# Patient Record
Sex: Female | Born: 1950 | Race: White | Hispanic: No
Health system: Midwestern US, Academic
[De-identification: ages and names within clinical notes are randomized; demographics above are authoritative.]

---

## 2016-08-09 ENCOUNTER — Encounter: Admit: 2016-08-09 | Discharge: 2016-08-09 | Payer: PRIVATE HEALTH INSURANCE

## 2016-08-09 ENCOUNTER — Encounter: Admit: 2016-08-09 | Discharge: 2016-08-09 | Payer: MEDICARE

## 2016-08-09 DIAGNOSIS — Z9889 Other specified postprocedural states: ICD-10-CM

## 2016-08-09 DIAGNOSIS — R1032 Left lower quadrant pain: ICD-10-CM

## 2016-08-09 DIAGNOSIS — Z8041 Family history of malignant neoplasm of ovary: ICD-10-CM

## 2016-08-09 DIAGNOSIS — R718 Other abnormality of red blood cells: ICD-10-CM

## 2016-08-09 DIAGNOSIS — R971 Elevated cancer antigen 125 [CA 125]: ICD-10-CM

## 2016-08-09 DIAGNOSIS — M810 Age-related osteoporosis without current pathological fracture: ICD-10-CM

## 2016-08-09 DIAGNOSIS — Z1231 Encounter for screening mammogram for malignant neoplasm of breast: ICD-10-CM

## 2016-08-09 DIAGNOSIS — D582 Other hemoglobinopathies: ICD-10-CM

## 2016-08-09 DIAGNOSIS — E663 Overweight: ICD-10-CM

## 2016-08-09 DIAGNOSIS — Z923 Personal history of irradiation: ICD-10-CM

## 2016-08-09 DIAGNOSIS — R7989 Other specified abnormal findings of blood chemistry: ICD-10-CM

## 2016-08-09 DIAGNOSIS — Z853 Personal history of malignant neoplasm of breast: Principal | ICD-10-CM

## 2016-08-09 DIAGNOSIS — Z9221 Personal history of antineoplastic chemotherapy: ICD-10-CM

## 2016-08-09 DIAGNOSIS — Z803 Family history of malignant neoplasm of breast: ICD-10-CM

## 2016-08-09 DIAGNOSIS — R32 Unspecified urinary incontinence: ICD-10-CM

## 2016-08-09 DIAGNOSIS — E875 Hyperkalemia: ICD-10-CM

## 2016-08-09 DIAGNOSIS — Z9229 Personal history of other drug therapy: ICD-10-CM

## 2016-08-09 LAB — COMPREHENSIVE METABOLIC PANEL
Lab: 0.5 mg/dL (ref 0.3–1.2)
Lab: 0.8 mg/dL (ref 0.4–1.00)
Lab: 101 mg/dL — ABNORMAL HIGH (ref 70–100)
Lab: 137 MMOL/L (ref 137–147)
Lab: 17 mg/dL (ref 7–25)
Lab: 22 U/L — ABNORMAL HIGH (ref 7–40)
Lab: 27 MMOL/L (ref 21–30)
Lab: 27 U/L — ABNORMAL HIGH (ref 7–56)
Lab: 4 g/dL — ABNORMAL LOW (ref 3.5–5.0)
Lab: 4.5 MMOL/L (ref 3.5–5.1)
Lab: 5 10*3/uL — ABNORMAL HIGH (ref 3–12)
Lab: 6.9 g/dL — ABNORMAL HIGH (ref 6.0–8.0)
Lab: 65 U/L — ABNORMAL LOW (ref >=60)
Lab: 9.2 mg/dL (ref 8.5–10.6)
Lab: >60 mL/min (ref >60)
Lab: >60 mL/min (ref >60)

## 2016-08-09 LAB — CBC AND DIFF
Lab: 0 10*3/uL — ABNORMAL LOW (ref 0–0.20)
Lab: 4.9 M/UL (ref 4.0–5.0)
Lab: 5.4 10*3/uL (ref 4.5–11.0)

## 2016-08-09 LAB — CA125: Lab: 43 U/mL — ABNORMAL HIGH (ref <35)

## 2016-08-11 ENCOUNTER — Encounter: Admit: 2016-08-11 | Discharge: 2016-08-11 | Payer: MEDICARE

## 2016-08-11 DIAGNOSIS — E785 Hyperlipidemia, unspecified: ICD-10-CM

## 2016-08-11 DIAGNOSIS — Z853 Personal history of malignant neoplasm of breast: ICD-10-CM

## 2016-08-11 DIAGNOSIS — E162 Hypoglycemia, unspecified: ICD-10-CM

## 2016-08-11 DIAGNOSIS — Z9109 Other allergy status, other than to drugs and biological substances: ICD-10-CM

## 2016-08-11 DIAGNOSIS — C50212 Malignant neoplasm of upper-inner quadrant of left female breast: ICD-10-CM

## 2016-08-11 DIAGNOSIS — M549 Dorsalgia, unspecified: ICD-10-CM

## 2016-08-11 DIAGNOSIS — E559 Vitamin D deficiency, unspecified: ICD-10-CM

## 2016-08-11 DIAGNOSIS — F329 Major depressive disorder, single episode, unspecified: ICD-10-CM

## 2016-08-11 DIAGNOSIS — K219 Gastro-esophageal reflux disease without esophagitis: Principal | ICD-10-CM

## 2016-08-11 DIAGNOSIS — M810 Age-related osteoporosis without current pathological fracture: ICD-10-CM

## 2016-08-24 ENCOUNTER — Encounter: Admit: 2016-08-24 | Discharge: 2016-08-24 | Payer: PRIVATE HEALTH INSURANCE

## 2016-08-24 DIAGNOSIS — Z853 Personal history of malignant neoplasm of breast: Principal | ICD-10-CM

## 2016-08-24 DIAGNOSIS — R1032 Left lower quadrant pain: ICD-10-CM

## 2016-08-24 DIAGNOSIS — R32 Unspecified urinary incontinence: ICD-10-CM

## 2016-08-24 MED ORDER — SODIUM CHLORIDE 0.9 % IJ SOLN
50 mL | Freq: Once | INTRAVENOUS | 0 refills | Status: CP
Start: 2016-08-24 — End: ?
  Administered 2016-08-24: 19:00:00 50 mL via INTRAVENOUS

## 2016-08-24 MED ORDER — IOPAMIDOL 76 % IV SOLN
100 mL | Freq: Once | INTRAVENOUS | 0 refills | Status: CP
Start: 2016-08-24 — End: ?
  Administered 2016-08-24: 19:00:00 100 mL via INTRAVENOUS

## 2017-02-06 ENCOUNTER — Encounter: Admit: 2017-02-06 | Discharge: 2017-02-06 | Payer: PRIVATE HEALTH INSURANCE

## 2017-02-06 ENCOUNTER — Encounter: Admit: 2017-02-06 | Discharge: 2017-02-06

## 2017-02-06 ENCOUNTER — Encounter: Admit: 2017-02-06 | Discharge: 2017-02-06 | Payer: MEDICARE

## 2017-02-06 DIAGNOSIS — Z1231 Encounter for screening mammogram for malignant neoplasm of breast: Principal | ICD-10-CM

## 2017-02-06 DIAGNOSIS — Z170 Estrogen receptor positive status [ER+]: ICD-10-CM

## 2017-02-06 DIAGNOSIS — Z9889 Other specified postprocedural states: ICD-10-CM

## 2017-02-06 DIAGNOSIS — R971 Elevated cancer antigen 125 [CA 125]: ICD-10-CM

## 2017-02-06 DIAGNOSIS — M810 Age-related osteoporosis without current pathological fracture: ICD-10-CM

## 2017-02-06 DIAGNOSIS — Z8041 Family history of malignant neoplasm of ovary: ICD-10-CM

## 2017-02-06 DIAGNOSIS — E663 Overweight: ICD-10-CM

## 2017-02-06 DIAGNOSIS — K219 Gastro-esophageal reflux disease without esophagitis: Principal | ICD-10-CM

## 2017-02-06 DIAGNOSIS — Z923 Personal history of irradiation: ICD-10-CM

## 2017-02-06 DIAGNOSIS — M549 Dorsalgia, unspecified: ICD-10-CM

## 2017-02-06 DIAGNOSIS — Z9221 Personal history of antineoplastic chemotherapy: Secondary | ICD-10-CM

## 2017-02-06 DIAGNOSIS — E162 Hypoglycemia, unspecified: ICD-10-CM

## 2017-02-06 DIAGNOSIS — C50212 Malignant neoplasm of upper-inner quadrant of left female breast: ICD-10-CM

## 2017-02-06 DIAGNOSIS — Z853 Personal history of malignant neoplasm of breast: ICD-10-CM

## 2017-02-06 DIAGNOSIS — E559 Vitamin D deficiency, unspecified: ICD-10-CM

## 2017-02-06 DIAGNOSIS — Z9189 Other specified personal risk factors, not elsewhere classified: ICD-10-CM

## 2017-02-06 DIAGNOSIS — E785 Hyperlipidemia, unspecified: ICD-10-CM

## 2017-02-06 DIAGNOSIS — Z9109 Other allergy status, other than to drugs and biological substances: ICD-10-CM

## 2017-02-06 DIAGNOSIS — F329 Major depressive disorder, single episode, unspecified: ICD-10-CM

## 2017-02-06 LAB — CBC AND DIFF
Lab: 14 g/dL (ref 12.0–15.0)
Lab: 4.4 10*3/uL — ABNORMAL LOW (ref 4.5–11.0)
Lab: 43 % (ref 36–45)
Lab: 5 M/UL — ABNORMAL HIGH (ref 4.0–5.0)
Lab: 86 FL (ref 80–100)

## 2017-02-06 LAB — COMPREHENSIVE METABOLIC PANEL
Lab: 0.4 mg/dL (ref 0.3–1.2)
Lab: 0.7 mg/dL (ref 0.4–1.00)
Lab: 104 MMOL/L (ref 98–110)
Lab: 109 mg/dL — ABNORMAL HIGH (ref 70–100)
Lab: 137 MMOL/L (ref 137–147)
Lab: 22 U/L (ref 7–40)
Lab: 27 MMOL/L (ref 21–30)
Lab: 34 U/L (ref 7–56)
Lab: 4.6 MMOL/L (ref 3.5–5.1)
Lab: 6 10*3/uL (ref 3–12)
Lab: 9.6 mg/dL — ABNORMAL HIGH (ref 8.5–10.6)
Lab: 98 U/L (ref 25–110)
Lab: >60 mL/min (ref >60)
Lab: >60 mL/min (ref >60)

## 2017-02-06 LAB — CA125: Lab: 32 U/mL (ref <35)

## 2017-02-06 NOTE — Progress Notes
Kristen Norris is a 66 y.o. female.    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     TREATMENT SUMMARY:   This 66 year old postmenopausal female developed pain in the medial side of the left chest wall in 2/07. 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 She underwent left lumpectomy and sentinel lymph node sampling on 06/07/05. Lumpectomy revealed a 2.1 x 2-cm invasive ductal carcinoma, grade II. No angiolymphatic invasion. There was perineural invasion. 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 Margins were clear. 0/2 sentinel lymph nodes were involved. Oncotype DX was sent and came back with a high recurrence score of 33, which corresponds to a 10-year rate of distant recurrence of 23% with adjuvant tamoxifen alone. For adjuvant chemotherapy, she received four cycles of dose-dense AC and 10 weeks of taxol. Taxol had to be stopped at 10 weeks due to grade 3 peripheral neuropathy. She was placed on adjuvant Arimidex in 10/07. Radiation therapy was given in [place], [place] and completed in December 2007.  Adjuvant Femara since 11/24/05 - 11/2010.        Extensive family history of breast and ovarian cancer but genetic testing not done due to financial reasons.     HISTORY OF PRESENT ILLNESS:   Kristen Norris presents to the clinic today for continued follow-up related to her history of left chest cancer diagnosed in 2007. S/P lumpectomy/SLNBX (0/2). She received adjuvant chemotherapy AC x 4 followed by weekly Taxol x 10 of 12 planned cycles; stopped due to grade 3 peripheral neuropathy. She received WBR in [place] which completed in 01/2006. Kristen Norris has an extensive family history of breast and ovarian cancer but genetic testing not done due to financial reasons. At her visit in 01/2016 she complained of horrible bony pain and bone scan was performed 02/05/2016 which was negative for osseous metastatic disease. She also had an elevated Hgb, Hct and RBC as well as hyperkalemia. Abdominal US was performed 02/05/2016 and showed a milk calcium cyst (6mm) in the left kidney, otherwise normal.     In 05/2016 she was standing in her kitchen and without any sensation of having to urinate lost control of her bladder function. She eventually made it into the bathroom and then became doubled over in pain. The severe pain in her lower abdomen continued for about 3 hours. She states that she went to her PCP the next day and had lab drawn which showed signs of rheumatoid arthritis and in addition, she states that she also had blood in the urine. She continued to have lower left quadrant abdominal pain that is pretty constant, rates at 4 on a scale of 0/10 at her appointment 08/09/2016. CT A/P were performed on 08/24/2016 which showed   1. No intra-abdominal inflammatory mass, ascites, or bowel obstruction. ???The appendix is normal.  2. Mild distal colonic diverticulosis  3. Small, focal area of tree-in-bud type opacities within the right middle lobe that has developed since January 2014. ???This is nonspecific and may represent scarring related to a prior infection or represent infectious bronchiolitis. Patient instructed to F/U with primary care Dutch Gray, PA) in [place], [place] for further evaluation.     Symptoms have resolved. She is feeling well. Had a great Christmas.      Review of Systems  Appetite fair.  Weight (BMI  27.18 <- 26.46 <- 25.9 <- 25.54 <- 25.15).   She is raising her 59 year old grandson.   Completed femara therapy in 11/2010.    Patient reports that she is getting Prolia every 6 months (PCP). This treatment started in 03/2015.   No fever or chills.   No headache or dizziness.   No SOB or cough.   No chest pain or palpitations.   No diagnosis of LUE lymphedema. No numbness or tingling of the fingers or toes.  No weakness.   No skin rash.   Chronic lower back pain; has seen Dr. Rudell Cobb in the past with epidural steroid injection (10/2010); which helped.    No nausea, vomiting, diarrhea, constipation or abdominal pain.   Patient states that she had a colonoscopy ([place]); states was negative and cannot remember date; probably > 10 years ago.   Total laparoscopic hysterectomy bilateral salpingooophorectomy 03/2011 (pathology negative)  Family history of paternal first cousins x 3 with breast CA (50, 83, 58), 1 paternal aunt with breast CA (49) and 1 paternal cousin with ovarian CA. No change in family history. Patient has 2 daughters.   No hot flashes or vaginal dryness; patient is not sexually active.   No formal exercise.  Former smoker (20 year 1ppd history); quit 1999.     Current Medication List:         ??? aspirin EC 81 mg tablet Take 81 mg by mouth daily.     ??? CALCIUM CARBONATE (CALCIUM 500 PO) Take 1 Cap by mouth Daily.   ??? Cholecalciferol (Vitamin D3) (VITAMIN D) 1,000 unit cap Take 3 Caps by mouth daily.   ??? cyclobenzaprine (FLEXERIL) 10 mg tablet Take 1 Tab by mouth three times daily as needed for Muscle Cramps.   ??? darifenacin ER(+) (ENABLEX) 15 mg tablet    ??? denosumab (PROLIA) 60 mg/mL syrg Inject 60 mg under the skin once.   ??? ibuprofen (MOTRIN) 600 mg tablet Take 1 Tab by mouth every 6 hours as needed for Pain.          Objective   Vitals:    02/06/17 1301   BP: 135/88   Pulse: 88   Resp: 15   Temp: 36.7 ???C (98.1 ???F)   TempSrc: Oral   SpO2: 96%   Weight: 67.4 kg (148 lb 9.6 oz)   Height: 157.5 cm (62)       Body mass index is 27.18 kg/m???.     Pain Rating:   0       Pain Addressed:  N/A    Fatigue Scale:6    ECOG performance status is 0, Fully active, able to carry on all pre-disease performance without restriction.Marland Kitchen    Physical Exam   Pulmonary/Chest:       Lymphadenopathy:     She has no cervical adenopathy.     She has no axillary adenopathy. Right: No supraclavicular adenopathy present.        Left: No supraclavicular adenopathy present.   Vitals reviewed.    This is a 66 year old female in no acute distress, well developed.   HEENT:  No icterus.   Neck: No JVD, supple.   Chest: Wheezes noted in the bilateral lower lobes (unchanged).    CV:  RRR without murmur.   Abdomen: Soft, non-distended, non-tender, positive bowel sounds, no organomegaly.   Skin: No rash.   Extremities: No clubbing, cyanosis.   No evidence of LUE lymphedema.   Neuro: CN: II-XII grossly  intact; no sensory or motor abnormalities noted.     CBC w/Diff    Lab Results   Component Value Date/Time    WBC 4.4 (L) 02/06/2017 11:10 AM    RBC 5.03 (H) 02/06/2017 11:10 AM    HGB 14.3 02/06/2017 11:10 AM    HCT 43.7 02/06/2017 11:10 AM    MCV 86.9 02/06/2017 11:10 AM    MCH 28.4 02/06/2017 11:10 AM    MCHC 32.7 02/06/2017 11:10 AM    RDW 13.6 02/06/2017 11:10 AM    PLTCT 316 02/06/2017 11:10 AM    MPV 8.2 02/06/2017 11:10 AM    Lab Results   Component Value Date/Time    NEUT 42 02/06/2017 11:10 AM    ANC 1.80 02/06/2017 11:10 AM    LYMA 45 (H) 02/06/2017 11:10 AM    ALC 2.00 02/06/2017 11:10 AM    MONA 9 02/06/2017 11:10 AM    AMC 0.40 02/06/2017 11:10 AM    EOSA 3 02/06/2017 11:10 AM    AEC 0.10 02/06/2017 11:10 AM    BASA 1 02/06/2017 11:10 AM    ABC 0.00 02/06/2017 11:10 AM        Comprehensive Metabolic Profile    Lab Results   Component Value Date/Time    NA 137 02/06/2017 11:10 AM    K 4.6 02/06/2017 11:10 AM    CL 104 02/06/2017 11:10 AM    CO2 27 02/06/2017 11:10 AM    GAP 6 02/06/2017 11:10 AM    BUN 13 02/06/2017 11:10 AM    CR 0.79 02/06/2017 11:10 AM    GLU 109 (H) 02/06/2017 11:10 AM    GLU 98 09/23/2005 10:13 AM    Lab Results   Component Value Date/Time    CA 9.6 02/06/2017 11:10 AM    ALBUMIN 4.0 02/06/2017 11:10 AM    TOTPROT 6.8 02/06/2017 11:10 AM    ALKPHOS 98 02/06/2017 11:10 AM    AST 22 02/06/2017 11:10 AM    ALT 34 02/06/2017 11:10 AM TOTBILI 0.4 02/06/2017 11:10 AM    GFR >60 02/06/2017 11:10 AM    GFRAA >60 02/06/2017 11:10 AM        Lab Results   Component Value Date    CA125 43 (H) 08/09/2016      Lab Results   Component Value Date    CA125 32 02/06/2017        BREAST IMAGING:   Bilateral mammogram 07/23/08: BIRAD 2-BENIGN   Left breast US 07/23/08: BIRAD 1-NEGATIVE   Bilateral mammogram 08/05/09: BIRAD 1-NEGATIVE   Bilateral mammogram 12/12011: BIRADS 1  Bilateral breast US 01/2010: Multiple real-time grayscale images were utilized to evaluate the numerous sites of palpable fullness within the right upper breast, right medial breast at the 3:00 position, left breast 12:00 position, left breast 11:00 position and the left periareolar region superiorly. No masses are seen. There is no evidence of malignancy Impression:  ASSESSMENT: BIRAD 1-NEGATIVE.   Bilateral mammograms 02/02/2011: BIRADS 1  Bilateral DMAMM (TOMO) and Left breast ultrasound 02/07/2012:    FINDINGS:  There are scattered fibroglandular densities. 3-D (tomosynthesis) images were performed in addition to 2D ammograms. No masses, densities or calcifications to suggest malignancy. No change when compared to prior studies.    FINDINGS:  Ultrasound was performed to evaluate the area of palpable finding and pain detected by the patient at 12:00 5 cm from the nipple. At this location no abnormalities are present. Additional imaging was performed at areas of skin marking  where referring provider palpated a finding. These are located at 1230 o'clock 4 cm from the nipple 11:00 6 cm from the nipple and 1130 5 cm from the nipple. At the locations no abnormalities are present. Scar from prior lumpectomy is noted at 10:00 7 cm from the nipple.  Imaging of the left axilla is unremarkable.  IMPRESSION:  ASSESSMENT: BIRAD 2-BENIGN.    Bilateral mammogram (TOMO) 02/05/2013:  ASSESSMENT: BIRAD 2-BENIGN  Bilateral mammogram (TOMO) 02/04/2014: Results pending at this dictation ASSESSMENT: BIRAD 4-Suspicious (Overall)  DIAG MAMMO BL/T: BIRAD 4-suspicious abnormality in both breasts.  [place] TARGET BILAT: BIRAD 1-negative.  RECOMMENDATION:  Stereotactic biopsy of the left breast. ???  Surgical pathology:   Left MTBX 03/10/2014:   Final Diagnosis:   A. Breast, left breast 10:00 with calcs, needle core biopsies: Atrophic breast tissue with focal fibrosis and calcifications.   B. Breast, left breast 10:00 no calcs, needle core biopsies: Benign breast tissue with microcalcification within fibrotic stroma.   Bilateral screening mammogram TOMO 02/05/15: ACR BI-RADS??? Assessments: BIRAD 1-Negative  02/05/2016 BILATERAL MAMMOGRAM:  ACR BI-RADS??? Assessments: BIRAD 1-Negative  02/05/2016 TARGETED RIGHT BREAST ULTRASOUND: ACR BI-RADS??? Assessments: BIRAD 1-Negative  02/06/2017 BILATERAL MAMMOGRAM: ACR BI-RADS??? Assessments: BIRAD 2-Benign    BONE HEALTH:     BMD 03/2007 revealed T score of -2.3 in spine and -2.9 in femur (osteoporosis). There was improvement in BMD of both spine and hip  BMD 07/23/08: Osteoporosis, mild improvement in BMD.   BMD 08/05/09: Osteoporosis, minimal increase in BMD.   BMD 10/06/10: Stable osteoporosis  BMD 02/07/12: Persistent osteoporosis; improvement in lumbar spine; stable in femurs.   BMD 02/05/13:  OSTEOPOROSIS. CURRENT MINIMUM T SCORE -2.7 AT THE RIGHT FEMORAL NECK VERSUS PRIOR MINIMUM T SCORE -2.6 AT THE LEFT FEMORAL NECK.   BMD 02/04/2014: Stable findings of osteoporosis places patient at high risk for fracture.   BMD 02/05/2015: Osteoporosis, at high risk for fracture.  BMD 02/05/2016: Persistent findings of Osteoporosis.    IMAGING:   MRI L-SPINE 04/2010:   1. 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  2. THERE IS MILD ANTERIOR SPURRING AT L1-L2 WITH MODERATE ENDPLATE CHANGES IN THE ANTEROSUPERIOR ASPECT OF THE L2 VERTEBRAL BODY AS DESCRIBED. MOST LIKELY THIS REPRESENTS REACTIVE DEGENERATIVE CHANGES. NEOPLASM IS FELT TO BE LESS LIKELY.     BONE SCAN 04/09/2010: NO METASTATIC DISEASE.     CXR 02/07/2012:  NO ACUTE DISEASE IN THE CHEST.     CT CHEST 02/09/2012: NO EVIDENCE OF THORACIC METASTATIC DISEASE.     MRI HEAD 02/11/2013: UNREMARKABLE ENHANCED AND UNENHANCED MRI SCAN OF THE HEAD.     ABDOMINAL ULTRASOUND 02/05/2016: Small left inferior pole milk of calcium cyst. Otherwise, normal abdominal ultrasound.    BONE SCAN 02/05/2016: No scintigraphic evidence of disseminated osseous metastatic disease. Focal increased uptake in the left cervical thoracic junction which may reflect degenerative arthropathy.     CT CHEST/ABDOMEN 08/24/2016:   1. No intra-abdominal inflammatory mass, ascites, or bowel obstruction. ???The appendix is normal.  2. Mild distal colonic diverticulosis  3. Small, focal area of tree-in-bud type opacities within the right middle lobe that has developed since January 2014. ???This is nonspecific and may represent scarring related to a prior infection or represent infectious bronchiolitis. ???  Patient instructed to F/U with primary care Dutch Gray, PA) in [place], [place] for further evaluation.       Assessment and Plan:  74. A 66 year old postmenopausal white female with T2 Grade II, ER positive, HER-2/neu negative left breast cancer, S/P adjuvant chemotherapy, dose-dense Adriamycin and Cytoxan and 10 weeks of Taxol. Currently on adjuvant Femara since 10/07 and finished 5 years 11/2010. Menopausal symptoms and depression are stable per patient she stopped Effexor due to cost (about 2 years ago).   2. Osteoporosis; BMD 02/05/2013 (osteoporosis).  Patient continues on boniva (PCP prescribes). Repeat BMD 01/2016; shows continued stable osteoporosis. Patient started Prolia in 03/2015, has received 3 injections so far (receives at PCP office). Patient will discuss alternative medications with her primary doctor for osteoporosis as Prolia is cost prohibitive.   3. Low back pain:MRI Lumbar spine with DJD; negative bone scan in 04/2010; has seen Dr. Welton Flakes in the past with steroid injection in 10/2010.     4. Family history of breast and ovarian cancer: no genetic testing  Patient has undergone hysterectomy/BSO 03/24/2011 (pathology negative). Will continue CA 125 annually.   5. Reviewed signs to watch for that could indicate a local or distant recurrence. Patient will call our office with any new signs.   Local recurrence  Signs and symptoms of local recurrence within the same breast may include:   A new lump in your breast or irregular area of firmness   A new thickening in your breast area   A new pulling back of the skin or dimpling at the lumpectomy site   Skin inflammation or area of redness   Flattening or indentation of your nipple or other nipple changes  A regional breast cancer recurrence means the cancer has come back in the lymph nodes in your armpit or collarbone area. Signs and symptoms of regional recurrence may include:   A lump or swelling in the lymph nodes under your arm or in the groove above your collarbone   Swelling of your arm (this could be related to lymphedema or even a blood clot)  Persistent pain in your arm and shoulder   Increasing loss of sensation in your arm and hand  Distant (metastatic) recurrence   A distant, or metastatic, recurrence means the cancer has traveled to distant parts of the body, most commonly the bones, liver and lungs. The signs and symptoms may include:   Pain, such as chest or bone pain   Persistent, dry cough   Difficulty breathing   Loss of appetite   Persistent nausea, vomiting or weight loss   Swelling in the abdomen  Severe headaches  When to call our office   You know your body best ??? what feels normal and what doesn't. It's important to be aware of the signs and symptoms of recurrent breast cancer, such as:   New and persistent pain   Changes or new lumps in your breast  Weight loss Shortness of breath  If you experience any signs and symptoms that might suggest a recurrence, call our office. Patient verbalized understanding and phone numbers provided.    6. RTC 12 months with bilateral mammogram and lab.     I have spent 50 minutes with the patient today. 45 minutes spent face to face in evaluation, management, counseling and coordination of care functions.   Collaborating physician: Joni Reining, MD  NPI # [id number]         Ernie Hew, APRN

## 2017-11-07 ENCOUNTER — Encounter: Admit: 2017-11-07 | Discharge: 2017-11-07 | Payer: MEDICARE

## 2017-11-07 ENCOUNTER — Ambulatory Visit: Admit: 2017-11-07 | Discharge: 2017-11-08 | Payer: MEDICARE

## 2017-11-07 DIAGNOSIS — C50212 Malignant neoplasm of upper-inner quadrant of left female breast: ICD-10-CM

## 2017-11-07 DIAGNOSIS — Z9109 Other allergy status, other than to drugs and biological substances: ICD-10-CM

## 2017-11-07 DIAGNOSIS — E559 Vitamin D deficiency, unspecified: ICD-10-CM

## 2017-11-07 DIAGNOSIS — M549 Dorsalgia, unspecified: ICD-10-CM

## 2017-11-07 DIAGNOSIS — M810 Age-related osteoporosis without current pathological fracture: ICD-10-CM

## 2017-11-07 DIAGNOSIS — F329 Major depressive disorder, single episode, unspecified: ICD-10-CM

## 2017-11-07 DIAGNOSIS — Z853 Personal history of malignant neoplasm of breast: ICD-10-CM

## 2017-11-07 DIAGNOSIS — E162 Hypoglycemia, unspecified: ICD-10-CM

## 2017-11-07 DIAGNOSIS — E785 Hyperlipidemia, unspecified: ICD-10-CM

## 2017-11-07 DIAGNOSIS — M2550 Pain in unspecified joint: Principal | ICD-10-CM

## 2017-11-07 DIAGNOSIS — K219 Gastro-esophageal reflux disease without esophagitis: Principal | ICD-10-CM

## 2017-11-08 DIAGNOSIS — M2550 Pain in unspecified joint: Principal | ICD-10-CM

## 2017-11-08 LAB — RHEUMATOID FACTOR (RF): Lab: 36 [IU]/mL — ABNORMAL HIGH (ref <25)

## 2017-11-08 LAB — ANTI-NUCLEAR ANTIBODY(ANA)

## 2017-11-08 LAB — C4 COMPLEMENT 4: Lab: 44 mg/dL (ref 10–49)

## 2017-11-08 LAB — ANTI SSA ANTI SSB AB

## 2017-11-08 LAB — CCP IGG ANTIBODY

## 2017-11-08 LAB — ANTI-NUCLEAR AB(ANA)-QUANT: Lab: 80 — ABNORMAL HIGH (ref <80)

## 2017-11-08 LAB — C3 COMPLEMENT 3: Lab: 161 mg/dL (ref 88–200)

## 2017-11-09 LAB — ELECTROPHORESIS-SERUM PROTEIN
Lab: 10 % (ref 9–21)
Lab: 12 % (ref 5–15)
Lab: 6.9 g/dL (ref 6.0–8.0)
Lab: 63 % (ref 48–68)

## 2017-11-15 ENCOUNTER — Encounter: Admit: 2017-11-15 | Discharge: 2017-11-15 | Payer: MEDICARE

## 2017-11-27 ENCOUNTER — Ambulatory Visit: Admit: 2017-11-27 | Discharge: 2017-11-28 | Payer: MEDICARE

## 2017-11-27 ENCOUNTER — Encounter: Admit: 2017-11-27 | Discharge: 2017-11-27 | Payer: MEDICARE

## 2017-11-27 DIAGNOSIS — E162 Hypoglycemia, unspecified: ICD-10-CM

## 2017-11-27 DIAGNOSIS — Z853 Personal history of malignant neoplasm of breast: ICD-10-CM

## 2017-11-27 DIAGNOSIS — E785 Hyperlipidemia, unspecified: ICD-10-CM

## 2017-11-27 DIAGNOSIS — M549 Dorsalgia, unspecified: ICD-10-CM

## 2017-11-27 DIAGNOSIS — K219 Gastro-esophageal reflux disease without esophagitis: Principal | ICD-10-CM

## 2017-11-27 DIAGNOSIS — F329 Major depressive disorder, single episode, unspecified: ICD-10-CM

## 2017-11-27 DIAGNOSIS — C50212 Malignant neoplasm of upper-inner quadrant of left female breast: ICD-10-CM

## 2017-11-27 DIAGNOSIS — Z9109 Other allergy status, other than to drugs and biological substances: ICD-10-CM

## 2017-11-27 DIAGNOSIS — M810 Age-related osteoporosis without current pathological fracture: ICD-10-CM

## 2017-11-27 DIAGNOSIS — E559 Vitamin D deficiency, unspecified: ICD-10-CM

## 2017-11-27 MED ORDER — PILOCARPINE HCL 5 MG PO TAB
ORAL_TABLET | Freq: Every day | 3 refills | Status: AC
Start: 2017-11-27 — End: 2018-03-05

## 2017-11-28 DIAGNOSIS — M3500 Sicca syndrome, unspecified: Principal | ICD-10-CM

## 2018-02-06 ENCOUNTER — Encounter: Admit: 2018-02-06 | Discharge: 2018-02-06 | Payer: MEDICARE

## 2018-02-06 DIAGNOSIS — Z853 Personal history of malignant neoplasm of breast: ICD-10-CM

## 2018-02-06 DIAGNOSIS — E785 Hyperlipidemia, unspecified: ICD-10-CM

## 2018-02-06 DIAGNOSIS — M810 Age-related osteoporosis without current pathological fracture: ICD-10-CM

## 2018-02-06 DIAGNOSIS — M549 Dorsalgia, unspecified: ICD-10-CM

## 2018-02-06 DIAGNOSIS — Z9109 Other allergy status, other than to drugs and biological substances: ICD-10-CM

## 2018-02-06 DIAGNOSIS — K219 Gastro-esophageal reflux disease without esophagitis: Principal | ICD-10-CM

## 2018-02-06 DIAGNOSIS — C50212 Malignant neoplasm of upper-inner quadrant of left female breast: ICD-10-CM

## 2018-02-06 DIAGNOSIS — F329 Major depressive disorder, single episode, unspecified: ICD-10-CM

## 2018-02-06 DIAGNOSIS — E162 Hypoglycemia, unspecified: ICD-10-CM

## 2018-02-06 DIAGNOSIS — E559 Vitamin D deficiency, unspecified: ICD-10-CM

## 2018-02-08 ENCOUNTER — Encounter: Admit: 2018-02-08 | Discharge: 2018-02-09 | Payer: MEDICARE

## 2018-02-08 ENCOUNTER — Encounter: Admit: 2018-02-08 | Discharge: 2018-02-08 | Payer: MEDICARE

## 2018-02-08 DIAGNOSIS — Z87891 Personal history of nicotine dependence: Secondary | ICD-10-CM

## 2018-02-08 DIAGNOSIS — R971 Elevated cancer antigen 125 [CA 125]: Secondary | ICD-10-CM

## 2018-02-08 DIAGNOSIS — Z780 Asymptomatic menopausal state: Secondary | ICD-10-CM

## 2018-02-08 DIAGNOSIS — Z9221 Personal history of antineoplastic chemotherapy: ICD-10-CM

## 2018-02-08 DIAGNOSIS — Z9109 Other allergy status, other than to drugs and biological substances: Secondary | ICD-10-CM

## 2018-02-08 DIAGNOSIS — E559 Vitamin D deficiency, unspecified: Secondary | ICD-10-CM

## 2018-02-08 DIAGNOSIS — M810 Age-related osteoporosis without current pathological fracture: Secondary | ICD-10-CM

## 2018-02-08 DIAGNOSIS — Z13820 Encounter for screening for osteoporosis: Secondary | ICD-10-CM

## 2018-02-08 DIAGNOSIS — Z8781 Personal history of (healed) traumatic fracture: ICD-10-CM

## 2018-02-08 DIAGNOSIS — E785 Hyperlipidemia, unspecified: Secondary | ICD-10-CM

## 2018-02-08 DIAGNOSIS — Z1231 Encounter for screening mammogram for malignant neoplasm of breast: Secondary | ICD-10-CM

## 2018-02-08 DIAGNOSIS — Z9189 Other specified personal risk factors, not elsewhere classified: ICD-10-CM

## 2018-02-08 DIAGNOSIS — K219 Gastro-esophageal reflux disease without esophagitis: Secondary | ICD-10-CM

## 2018-02-08 DIAGNOSIS — Z79811 Long term (current) use of aromatase inhibitors: Secondary | ICD-10-CM

## 2018-02-08 DIAGNOSIS — Z1379 Encounter for other screening for genetic and chromosomal anomalies: Secondary | ICD-10-CM

## 2018-02-08 DIAGNOSIS — C50912 Malignant neoplasm of unspecified site of left female breast: Secondary | ICD-10-CM

## 2018-02-08 DIAGNOSIS — Z853 Personal history of malignant neoplasm of breast: Secondary | ICD-10-CM

## 2018-02-08 DIAGNOSIS — M549 Dorsalgia, unspecified: Secondary | ICD-10-CM

## 2018-02-08 DIAGNOSIS — E162 Hypoglycemia, unspecified: Secondary | ICD-10-CM

## 2018-02-08 DIAGNOSIS — F329 Major depressive disorder, single episode, unspecified: Secondary | ICD-10-CM

## 2018-02-08 DIAGNOSIS — C50212 Malignant neoplasm of upper-inner quadrant of left female breast: Secondary | ICD-10-CM

## 2018-02-08 LAB — COMPREHENSIVE METABOLIC PANEL
Lab: 0.4 mg/dL (ref 0.3–1.2)
Lab: 0.7 mg/dL (ref 0.4–1.00)
Lab: 102 MMOL/L (ref 98–110)
Lab: 110 U/L (ref 25–110)
Lab: 118 mg/dL — ABNORMAL HIGH (ref 70–100)
Lab: 137 MMOL/L — ABNORMAL HIGH (ref 137–147)
Lab: 15 mg/dL (ref 7–25)
Lab: 21 U/L (ref 7–40)
Lab: 27 U/L (ref 7–56)
Lab: 29 MMOL/L (ref 21–30)
Lab: 4.2 g/dL (ref 3.5–5.0)
Lab: 4.5 MMOL/L (ref 3.5–5.1)
Lab: 6 K/UL (ref 3–12)
Lab: 6.8 g/dL (ref 6.0–8.0)
Lab: 9.6 mg/dL (ref 8.5–10.6)
Lab: >60 mL/min (ref >60)
Lab: >60 mL/min (ref >60)

## 2018-02-08 LAB — CBC AND DIFF
Lab: 0 10*3/uL (ref 0–0.20)
Lab: 4.3 K/UL — ABNORMAL LOW (ref 4.5–11.0)
Lab: 5.1 M/UL — ABNORMAL HIGH (ref 4.0–5.0)

## 2018-02-08 LAB — CA125: Lab: 40 U/mL — ABNORMAL HIGH (ref <35)

## 2018-02-22 ENCOUNTER — Ambulatory Visit: Admit: 2018-02-22 | Discharge: 2018-02-22 | Payer: MEDICARE

## 2018-02-22 ENCOUNTER — Encounter: Admit: 2018-02-22 | Discharge: 2018-02-22 | Payer: MEDICARE

## 2018-02-22 ENCOUNTER — Encounter: Admit: 2018-02-22 | Discharge: 2018-02-23 | Payer: MEDICARE

## 2018-02-22 DIAGNOSIS — E785 Hyperlipidemia, unspecified: Secondary | ICD-10-CM

## 2018-02-22 DIAGNOSIS — R768 Other specified abnormal immunological findings in serum: Secondary | ICD-10-CM

## 2018-02-22 DIAGNOSIS — M549 Dorsalgia, unspecified: Secondary | ICD-10-CM

## 2018-02-22 DIAGNOSIS — M26621 Arthralgia of right temporomandibular joint: Secondary | ICD-10-CM

## 2018-02-22 DIAGNOSIS — Z1379 Encounter for other screening for genetic and chromosomal anomalies: Secondary | ICD-10-CM

## 2018-02-22 DIAGNOSIS — Z853 Personal history of malignant neoplasm of breast: Secondary | ICD-10-CM

## 2018-02-22 DIAGNOSIS — E559 Vitamin D deficiency, unspecified: Secondary | ICD-10-CM

## 2018-02-22 DIAGNOSIS — F329 Major depressive disorder, single episode, unspecified: Secondary | ICD-10-CM

## 2018-02-22 DIAGNOSIS — M810 Age-related osteoporosis without current pathological fracture: Secondary | ICD-10-CM

## 2018-02-22 DIAGNOSIS — E162 Hypoglycemia, unspecified: Secondary | ICD-10-CM

## 2018-02-22 DIAGNOSIS — C50212 Malignant neoplasm of upper-inner quadrant of left female breast: Secondary | ICD-10-CM

## 2018-02-22 DIAGNOSIS — M3500 Sicca syndrome, unspecified: Secondary | ICD-10-CM

## 2018-02-22 DIAGNOSIS — K219 Gastro-esophageal reflux disease without esophagitis: Secondary | ICD-10-CM

## 2018-02-22 DIAGNOSIS — Z9109 Other allergy status, other than to drugs and biological substances: Secondary | ICD-10-CM

## 2018-02-22 DIAGNOSIS — R682 Dry mouth, unspecified: Secondary | ICD-10-CM

## 2018-02-23 ENCOUNTER — Encounter: Admit: 2018-02-23 | Discharge: 2018-02-23 | Payer: MEDICARE

## 2018-02-23 DIAGNOSIS — Z853 Personal history of malignant neoplasm of breast: Secondary | ICD-10-CM

## 2018-02-23 DIAGNOSIS — C50212 Malignant neoplasm of upper-inner quadrant of left female breast: Secondary | ICD-10-CM

## 2018-02-23 DIAGNOSIS — M810 Age-related osteoporosis without current pathological fracture: Secondary | ICD-10-CM

## 2018-02-23 DIAGNOSIS — E559 Vitamin D deficiency, unspecified: Secondary | ICD-10-CM

## 2018-02-23 DIAGNOSIS — K219 Gastro-esophageal reflux disease without esophagitis: Secondary | ICD-10-CM

## 2018-02-23 DIAGNOSIS — Z9109 Other allergy status, other than to drugs and biological substances: Secondary | ICD-10-CM

## 2018-02-23 DIAGNOSIS — E785 Hyperlipidemia, unspecified: Secondary | ICD-10-CM

## 2018-02-23 DIAGNOSIS — F329 Major depressive disorder, single episode, unspecified: Secondary | ICD-10-CM

## 2018-02-23 DIAGNOSIS — M549 Dorsalgia, unspecified: Secondary | ICD-10-CM

## 2018-02-23 DIAGNOSIS — E162 Hypoglycemia, unspecified: Secondary | ICD-10-CM

## 2018-02-23 DIAGNOSIS — Z1379 Encounter for other screening for genetic and chromosomal anomalies: Secondary | ICD-10-CM

## 2018-03-05 ENCOUNTER — Encounter: Admit: 2018-03-05 | Discharge: 2018-03-05 | Payer: MEDICARE

## 2018-03-05 ENCOUNTER — Ambulatory Visit: Admit: 2018-03-05 | Discharge: 2018-03-05 | Payer: MEDICARE

## 2018-03-05 DIAGNOSIS — F329 Major depressive disorder, single episode, unspecified: Secondary | ICD-10-CM

## 2018-03-05 DIAGNOSIS — Z9109 Other allergy status, other than to drugs and biological substances: Secondary | ICD-10-CM

## 2018-03-05 DIAGNOSIS — R2689 Other abnormalities of gait and mobility: Secondary | ICD-10-CM

## 2018-03-05 DIAGNOSIS — E559 Vitamin D deficiency, unspecified: Secondary | ICD-10-CM

## 2018-03-05 DIAGNOSIS — Z853 Personal history of malignant neoplasm of breast: Secondary | ICD-10-CM

## 2018-03-05 DIAGNOSIS — K219 Gastro-esophageal reflux disease without esophagitis: Secondary | ICD-10-CM

## 2018-03-05 DIAGNOSIS — M549 Dorsalgia, unspecified: Secondary | ICD-10-CM

## 2018-03-05 DIAGNOSIS — E162 Hypoglycemia, unspecified: Secondary | ICD-10-CM

## 2018-03-05 DIAGNOSIS — C50212 Malignant neoplasm of upper-inner quadrant of left female breast: Secondary | ICD-10-CM

## 2018-03-05 DIAGNOSIS — M3500 Sicca syndrome, unspecified: Secondary | ICD-10-CM

## 2018-03-05 DIAGNOSIS — E785 Hyperlipidemia, unspecified: Secondary | ICD-10-CM

## 2018-03-05 DIAGNOSIS — Z1379 Encounter for other screening for genetic and chromosomal anomalies: Secondary | ICD-10-CM

## 2018-03-05 DIAGNOSIS — M79643 Pain in unspecified hand: Secondary | ICD-10-CM

## 2018-03-05 DIAGNOSIS — M810 Age-related osteoporosis without current pathological fracture: Secondary | ICD-10-CM

## 2018-03-05 LAB — VITAMIN B12: Lab: 268 pg/mL (ref 180–914)

## 2018-03-05 MED ORDER — PILOCARPINE HCL 5 MG PO TAB
5 mg | ORAL_TABLET | Freq: Three times a day (TID) | ORAL | 1 refills | Status: AC
Start: 2018-03-05 — End: 2018-08-08

## 2018-03-06 ENCOUNTER — Encounter: Admit: 2018-03-06 | Discharge: 2018-03-06 | Payer: MEDICARE

## 2018-03-06 LAB — COPPER: Lab: 1.3

## 2018-03-06 MED ORDER — SYRINGE WITH NEEDLE 3 ML 25 X 5/8" MISC SYRG
0 refills | 28.00000 days | Status: AC
Start: 2018-03-06 — End: 2019-03-12

## 2018-03-06 MED ORDER — CYANOCOBALAMIN (VITAMIN B-12) 1,000 MCG/ML IJ SOLN
Freq: Every day | SUBCUTANEOUS | 0 refills | 29.00000 days | Status: AC
Start: 2018-03-06 — End: ?

## 2018-03-06 MED ORDER — INSULIN SYRINGE-NEEDLE U-100 1 ML 25 GAUGE X 5/8" MISC SYRG
0 refills | 30.00000 days | Status: AC
Start: 2018-03-06 — End: 2018-03-06

## 2018-03-07 ENCOUNTER — Ambulatory Visit: Admit: 2018-03-07 | Discharge: 2018-03-07 | Payer: MEDICARE

## 2018-03-07 DIAGNOSIS — R2689 Other abnormalities of gait and mobility: Secondary | ICD-10-CM

## 2018-03-07 DIAGNOSIS — M3500 Sicca syndrome, unspecified: Secondary | ICD-10-CM

## 2018-03-07 LAB — METHYLMALONIC ACID QUANT: Lab: 0.2

## 2018-03-07 MED ORDER — GADOBENATE DIMEGLUMINE 529 MG/ML (0.1MMOL/0.2ML) IV SOLN
13 mL | Freq: Once | INTRAVENOUS | 0 refills | Status: CP
Start: 2018-03-07 — End: ?
  Administered 2018-03-07: 18:00:00 13 mL via INTRAVENOUS

## 2018-03-08 LAB — PYRIDOXAL 5 PHOSPHATE: Lab: 6

## 2018-03-09 ENCOUNTER — Encounter: Admit: 2018-03-09 | Discharge: 2018-03-09 | Payer: MEDICARE

## 2018-03-09 DIAGNOSIS — Z1379 Encounter for other screening for genetic and chromosomal anomalies: Secondary | ICD-10-CM

## 2018-03-09 DIAGNOSIS — E785 Hyperlipidemia, unspecified: Secondary | ICD-10-CM

## 2018-03-09 DIAGNOSIS — Z9109 Other allergy status, other than to drugs and biological substances: Secondary | ICD-10-CM

## 2018-03-09 DIAGNOSIS — F329 Major depressive disorder, single episode, unspecified: Secondary | ICD-10-CM

## 2018-03-09 DIAGNOSIS — E559 Vitamin D deficiency, unspecified: Secondary | ICD-10-CM

## 2018-03-09 DIAGNOSIS — K219 Gastro-esophageal reflux disease without esophagitis: Secondary | ICD-10-CM

## 2018-03-09 DIAGNOSIS — E162 Hypoglycemia, unspecified: Secondary | ICD-10-CM

## 2018-03-09 DIAGNOSIS — Z853 Personal history of malignant neoplasm of breast: Secondary | ICD-10-CM

## 2018-03-09 DIAGNOSIS — C50212 Malignant neoplasm of upper-inner quadrant of left female breast: Secondary | ICD-10-CM

## 2018-03-09 DIAGNOSIS — M810 Age-related osteoporosis without current pathological fracture: Secondary | ICD-10-CM

## 2018-03-09 DIAGNOSIS — M549 Dorsalgia, unspecified: Secondary | ICD-10-CM

## 2018-03-20 ENCOUNTER — Encounter: Admit: 2018-03-20 | Discharge: 2018-03-20 | Payer: MEDICARE

## 2018-03-21 NOTE — Telephone Encounter
Discussed results with patient. They stated understanding regarding results. Discussed plan of care. Answered questions. Denied any other needs. Encouraged they call back if they thought of any more questions.

## 2018-04-26 ENCOUNTER — Encounter: Admit: 2018-04-26 | Discharge: 2018-04-26 | Payer: MEDICARE

## 2018-04-26 MED ORDER — CYANOCOBALAMIN (VITAMIN B-12) 1,000 MCG/ML IJ KIT
1000 ug | PACK | INTRAMUSCULAR | 0 refills | 29.00000 days | Status: AC
Start: 2018-04-26 — End: 2019-02-15

## 2018-05-03 ENCOUNTER — Encounter: Admit: 2018-05-03 | Discharge: 2018-05-03 | Payer: MEDICARE

## 2018-07-05 ENCOUNTER — Encounter: Admit: 2018-07-05 | Discharge: 2018-07-05 | Payer: MEDICARE

## 2018-07-05 DIAGNOSIS — M810 Age-related osteoporosis without current pathological fracture: ICD-10-CM

## 2018-07-05 DIAGNOSIS — M3500 Sicca syndrome, unspecified: ICD-10-CM

## 2018-07-05 DIAGNOSIS — E162 Hypoglycemia, unspecified: ICD-10-CM

## 2018-07-05 DIAGNOSIS — F329 Major depressive disorder, single episode, unspecified: ICD-10-CM

## 2018-07-05 DIAGNOSIS — Z9109 Other allergy status, other than to drugs and biological substances: ICD-10-CM

## 2018-07-05 DIAGNOSIS — E785 Hyperlipidemia, unspecified: ICD-10-CM

## 2018-07-05 DIAGNOSIS — Z1379 Encounter for other screening for genetic and chromosomal anomalies: ICD-10-CM

## 2018-07-05 DIAGNOSIS — E559 Vitamin D deficiency, unspecified: ICD-10-CM

## 2018-07-05 DIAGNOSIS — M1990 Unspecified osteoarthritis, unspecified site: ICD-10-CM

## 2018-07-05 DIAGNOSIS — G629 Polyneuropathy, unspecified: ICD-10-CM

## 2018-07-05 DIAGNOSIS — C50212 Malignant neoplasm of upper-inner quadrant of left female breast: ICD-10-CM

## 2018-07-05 DIAGNOSIS — M549 Dorsalgia, unspecified: ICD-10-CM

## 2018-07-05 DIAGNOSIS — K219 Gastro-esophageal reflux disease without esophagitis: Principal | ICD-10-CM

## 2018-07-05 DIAGNOSIS — Z853 Personal history of malignant neoplasm of breast: ICD-10-CM

## 2018-08-08 ENCOUNTER — Ambulatory Visit: Admit: 2018-08-08 | Discharge: 2018-08-09

## 2018-08-08 ENCOUNTER — Encounter: Admit: 2018-08-08 | Discharge: 2018-08-08

## 2018-08-08 DIAGNOSIS — M549 Dorsalgia, unspecified: Secondary | ICD-10-CM

## 2018-08-08 DIAGNOSIS — F329 Major depressive disorder, single episode, unspecified: Secondary | ICD-10-CM

## 2018-08-08 DIAGNOSIS — Z1379 Encounter for other screening for genetic and chromosomal anomalies: Secondary | ICD-10-CM

## 2018-08-08 DIAGNOSIS — E162 Hypoglycemia, unspecified: Secondary | ICD-10-CM

## 2018-08-08 DIAGNOSIS — E559 Vitamin D deficiency, unspecified: Secondary | ICD-10-CM

## 2018-08-08 DIAGNOSIS — G629 Polyneuropathy, unspecified: Secondary | ICD-10-CM

## 2018-08-08 DIAGNOSIS — E785 Hyperlipidemia, unspecified: Secondary | ICD-10-CM

## 2018-08-08 DIAGNOSIS — C50212 Malignant neoplasm of upper-inner quadrant of left female breast: Secondary | ICD-10-CM

## 2018-08-08 DIAGNOSIS — M1990 Unspecified osteoarthritis, unspecified site: Secondary | ICD-10-CM

## 2018-08-08 DIAGNOSIS — M810 Age-related osteoporosis without current pathological fracture: Secondary | ICD-10-CM

## 2018-08-08 DIAGNOSIS — G990 Autonomic neuropathy in diseases classified elsewhere: Secondary | ICD-10-CM

## 2018-08-08 DIAGNOSIS — M3500 Sicca syndrome, unspecified: Secondary | ICD-10-CM

## 2018-08-08 DIAGNOSIS — Z9109 Other allergy status, other than to drugs and biological substances: Secondary | ICD-10-CM

## 2018-08-08 DIAGNOSIS — K219 Gastro-esophageal reflux disease without esophagitis: Secondary | ICD-10-CM

## 2018-08-08 DIAGNOSIS — Z853 Personal history of malignant neoplasm of breast: Secondary | ICD-10-CM

## 2018-08-08 NOTE — Progress Notes
Date of Service: 08/08/2018    Subjective:             Kristen Norris is a 68 y.o. female seen in the clinic for imbalance.     History of Present Illness  This is a 68 year old right-handed female who lives in [place], [place] with her grand son and great grand son. She is working as a Facilities manager and denies smoking, alcohol, and illicit drug use. There is no family history of neurological disorder such as dementia or tremor. The patient tells me about arthritis and sicca symptoms in the last 2-3 years. 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 In the last 1 year, she has developed episodes of lightheadedness and she has to hold on to something. This has occurred while working outside and there is no relationship to meals. She is also complaining of pain in the back of her head. She denies constipation but has occasional lightheadedness. Recently, she has also complained of urinary urgency even leading to accidents. There is no RBD, tremor, or walking issues.     Her pertinent past medical history includes breast cancer diagnosed in 2007 which was treated with surgery, chemotherapy, and radiation. 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     Part I: Non-Motor  1.1Cognitive Impairment 1  1.2 Hallucinations & Psychosis 0  1.3 Depressed Mood 1  1.4 Anxious Mood 1  1.5 Apathy 0  1.6 Dopamine Dysregulation Syndrome 2  Part I: Non-Motor Aspects of Experiences of Daily Living  1.7 Sleep Problems 1  1.8 Daytime Sleepiness 1  1.9 Pain & Other Sensations 1  1.10 Urinary Problems 4  1.11 Constipation 0  1.12 Lightheadedness on Standing 2  1.13 Fatigue 1  Part II: Motor Aspects of Experiences of Daily Living  2.1 Speech  2  2.2 Saliva & Drooling  2  2.3 Chewing and Swallowing  0  2.4 Eating Tasks  0  2.5 Dressing  0  2.6 Hygiene  0  2.7 Handwriting  1  2.8 Doing Hobbies & Other Activities 1  2.9 Turning in Bed  0  2.10 Tremor 0    2.11 Getting out of Bed, Car, or Deep Chair  2 2.12 Walking & Balance  0  2.13 Freezing  0     Rheumatology office note reviewed: The patient reported progressive imbalance and even had a fall. She also reported urge incontinence and some labs were requested.     Labs reviewed: normal except low vitamin B12 level.         Review of Systems   Musculoskeletal: Positive for gait problem.   All other systems reviewed and are negative.    Allergies   Allergen Reactions   ??? Sulfa (Sulfonamide Antibiotics) RASH and NAUSEA AND VOMITING     Allergy recorded in SMS: Sulfa     Medical History:   Diagnosis Date   ??? Arthritis    ??? Back pain 07/18/2007   ??? Chest pain    ??? Depression    ??? Environmental allergies    ??? 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 Hereditary Cancer Update Test - [place])   ??? GERD (gastroesophageal reflux disease)    ??? Hyperlipemia    ??? Hypoglycemia    ??? 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  Neuropathy    ??? Osteoporosis 2/09    BMD  T-score (-2.3) spine and (-2.9) femur on Actonel   ??? Personal history of malignant neoplasm of breast    ??? PN (Peripheral Neuropathy)    ??? Sjogren's disease (HCC)    ??? Vitamin D deficiency 6/09     Surgical History:   Procedure Laterality Date   ??? CARDIAC CATHERIZATION  03/28/2005    LAD 20%, Circ ostial 30%, EF 65%   ??? HX BREAST LUMPECTOMY  06/07/05    Left with SLNBX (0/2)   ??? HX HYSTERECTOMY  03/24/2011    [name], MD [facility]/BSO   ??? HX BLADDER SUSPENSION     ??? HX TUBAL LIGATION       Family History   Problem Relation Age of Onset   ??? Heart Disease Mother    ??? Hypoglycemia Mother    ??? Heart Disease Father         farmer   ??? Coronary Artery Disease Father    ??? Heart Attack Brother 45   ??? Complete Hysterectomy Sister         rotator cuff surgery   ??? Diabetes Type II Maternal Grandmother    ??? Cancer-Hematologic Daughter 56   ??? COPD Daughter on oxygen   ??? Tobacco disorder Daughter         on Chantix   ??? Complete Hysterectomy Daughter 77   ??? None Reported Grandchild    ??? Other Grandchild         wrestler-stomach problems   ??? None Reported Grandchild    ??? None Reported Grandchild    ??? None Reported Grandchild    ??? None Reported Grandchild    ??? None Reported Sister    ??? Heart murmur Sister    ??? Heart Disease Sister    ??? Osteopenia Sister    ??? Depression Brother 56        hung himself 05/2017   ??? Other Brother 0        died in utero-umbilical cord wrapped around him   ??? Cancer-Breast Paternal Aunt 55        bilateral mastectomy   ??? Cancer-Breast Other 60        paternal 1st cousin   ??? Cancer-Lung Other 76        paternal 1st cousin   ??? Cancer-Breast Other 40        paternal 1st cousin   ??? Cancer-Ovarian Other 25   ??? None Reported Other    ??? Cancer-Breast Other 60        paternal 1st cousin   ??? Brain Tumor Other 50        nephew   ??? Cancer-Lung Paternal Uncle 45   ??? Cervical Cancer Neg Hx    ??? Cancer-Colon Neg Hx    ??? Cancer-Uterine Neg Hx    ??? Bleeding Disorders Neg Hx    ??? Diabetes Neg Hx      Social History     Socioeconomic History   ??? Marital status: Divorced     Spouse name: Not on file   ??? Number of children: 2   ??? Years of education: Not on file   ??? Highest education level: Not on file   Occupational History   ??? Occupation: grounds Human resources officer: [name]   Tobacco Use   ??? Smoking status: Former Smoker     Packs/day: 1.00     Years:  14.00     Pack years: 14.00     Types: Cigarettes     Last attempt to quit: 04/07/2000     Years since quitting: 18.3   ??? Smokeless tobacco: Never Used   Substance and Sexual Activity   ??? Alcohol use: Not Currently   ??? Drug use: No   ??? Sexual activity: Never   Other Topics Concern   ??? Not on file   Social History Narrative   ??? Not on file     Objective:         ??? amitriptyline (ELAVIL) 25 mg tablet Take 25 mg by mouth at bedtime as needed. ??? aspirin EC 81 mg tablet Take 81 mg by mouth daily. Taking every 3 days   ??? benzonatate (TESSALON PERLES) 100 mg capsule Take 100 mg by mouth every 8 hours as needed for Cough.   ??? CALCIUM CARBONATE (CALCIUM 500 PO) Take 1 Cap by mouth Daily.   ??? Cholecalciferol (Vitamin D3) (VITAMIN D) 1,000 unit cap Take 3 Caps by mouth daily.   ??? cyanocobalamin (vitamin B-12) 1,000 mcg/mL kit Inject 1,000 mcg to area(s) as directed every 30 days.   ??? cyclobenzaprine (FLEXERIL) 5 mg tablet Take 5 mg by mouth three times daily as needed for Muscle Cramps.   ??? fluticasone propionate (FLONASE) 50 mcg/actuation nasal spray, suspension Apply 1 spray to each nostril as directed daily.   ??? ibuprofen (MOTRIN) 600 mg tablet Take 1 Tab by mouth every 6 hours as needed for Pain.   ??? phenazopyrid-cran-vit C-B.coag (AZO URINARY TRACT HEALTH) 95 mg/250 mg-30 mg-15 mg tab Take  by mouth.   ??? potassium/magnesium (MAGNESIUM-POTASSIUM PO) Take  by mouth daily.   ??? Syringe with Needle (Disp) (BD SAFETYGLIDE SYRINGE) 3 mL 25 x 5/8 syrg Use to inject vitamin B12     Vitals:    08/08/18 1030   BP: (!) 155/79   BP Source: Arm, Left Upper   Patient Position: Sitting   Pulse: 77   Weight: 66 kg (145 lb 9.6 oz)   Height: 158.8 cm (62.5)     Body mass index is 26.21 kg/m???.     Physical Exam  Orientation: oriented to time, place, and person.   Recent and Remote memory: intact.   Attention span and concentration: intact.   Language: no aphasia.   Fund of knowledge: intact.   ???  General examination: healthy female in no acute distress. Head was normocephalic and atraumatic, conjunctiva were clear, mucous membranes were moist. Neck was supple. Extremities were warm and well perfused without edema.  ???  Neurologic examination: Speech was fluent. Cranial nerve exam showed full visual fields, pupils were equal round and reactive to light and accommodation, extra-ocular movements were full without square wave jerks or nystagmus. Saccades were brisk. Facial sensation and strength were intact, tongue was midline and with normal movements, and palate elevated symmetrically. Hearing was intact to conversation. Shoulder shrug was symmetric. Motor exam showed 5/5 strength throughout with normal muscle bulk. Reflexes were diminished throughout without Hoffman???s or ankle clonus. Sensation was impaired to pinprick, temperature, and vibration in the distal lower extremities. Toes showed flexor response to plantar stimulation. Coordination showed no dysmetria on finger-nose-finger testing. She was able to stand and walk tandem.       Assessment and Plan:  In summary, this is a 68 year old right-handed female with SS and RA.     1. Lightheadedness: she is having episodes of lightheadedness which is described as dizziness. Her  another complaint is headache which is actually coat hanger pain of low blood pressure. I advised her to wear elastic compression stockings and stay well hydrated. A referral to cardiology has been placed as well.    2. Urinary urgency: a referral has been placed for urology along with uroflowmetry and urodynamic study.     3. EMG-NCS for neuropathy evaluation - this may be chemotherapy induced.       The patient was provided with our clinic numbers if there is any question or concern. A follow-up visit would be arranged in 3 months.     Charlies Silvers, MD  Department of Neurology  [facility]

## 2018-08-09 DIAGNOSIS — R2689 Other abnormalities of gait and mobility: Secondary | ICD-10-CM

## 2018-08-09 DIAGNOSIS — N319 Neuromuscular dysfunction of bladder, unspecified: Secondary | ICD-10-CM

## 2018-08-09 DIAGNOSIS — G901 Familial dysautonomia [Riley-Day]: Secondary | ICD-10-CM

## 2018-08-18 ENCOUNTER — Encounter: Admit: 2018-08-18 | Discharge: 2018-08-18

## 2018-08-18 NOTE — Telephone Encounter
08/18/18 Called and spoke with pt about records info. PT states hasn't seen a cardiologist since last seen here at [facility] in 2013. PCP is with [facility]. No records to request edh

## 2018-08-21 ENCOUNTER — Encounter: Admit: 2018-08-21 | Discharge: 2018-08-21

## 2018-08-21 DIAGNOSIS — M810 Age-related osteoporosis without current pathological fracture: Secondary | ICD-10-CM

## 2018-08-21 DIAGNOSIS — E785 Hyperlipidemia, unspecified: Secondary | ICD-10-CM

## 2018-08-21 DIAGNOSIS — M3500 Sicca syndrome, unspecified: Secondary | ICD-10-CM

## 2018-08-21 DIAGNOSIS — K219 Gastro-esophageal reflux disease without esophagitis: Secondary | ICD-10-CM

## 2018-08-21 DIAGNOSIS — C50212 Malignant neoplasm of upper-inner quadrant of left female breast: Secondary | ICD-10-CM

## 2018-08-21 DIAGNOSIS — M1990 Unspecified osteoarthritis, unspecified site: Secondary | ICD-10-CM

## 2018-08-21 DIAGNOSIS — Z9109 Other allergy status, other than to drugs and biological substances: Secondary | ICD-10-CM

## 2018-08-21 DIAGNOSIS — Z853 Personal history of malignant neoplasm of breast: Secondary | ICD-10-CM

## 2018-08-21 DIAGNOSIS — G629 Polyneuropathy, unspecified: Secondary | ICD-10-CM

## 2018-08-21 DIAGNOSIS — M549 Dorsalgia, unspecified: Secondary | ICD-10-CM

## 2018-08-21 DIAGNOSIS — F329 Major depressive disorder, single episode, unspecified: Secondary | ICD-10-CM

## 2018-08-21 DIAGNOSIS — Z1379 Encounter for other screening for genetic and chromosomal anomalies: Secondary | ICD-10-CM

## 2018-08-21 DIAGNOSIS — E162 Hypoglycemia, unspecified: Secondary | ICD-10-CM

## 2018-08-21 DIAGNOSIS — E559 Vitamin D deficiency, unspecified: Secondary | ICD-10-CM

## 2018-08-21 MED ORDER — PREDNISONE 5 MG PO TAB
ORAL_TABLET | Freq: Every day | 0 refills | Status: DC
Start: 2018-08-21 — End: 2018-09-18

## 2018-08-21 NOTE — Progress Notes
Kristen Norris  is a 68 year old- Caucasian female here for a follow-up visit    The patient is referred by Dr. Olene Floss    Chief complaint: Management of seronegative Sjogren's syndrome      Today  She was last seen in January at which time she reported urinary incontinence lower extremity neuropathic symptoms and also imbalance, an MRI of the spine did not reveal clear evidence of transverse myelitis, B12 was found to be low and she started replacement, while this did not help her above symptoms, it did resolve in improvement of her level of energy and she continues to be on maintenance dose now, she has had a history of breast cancer treated with chemotherapy around 2007 and was recently evaluated by Dr. Chales Abrahams from neurology service who felt that this could be the underlying cause of her neuropathic symptoms lower extremities, nerve conduction studies planned for August  She was also referred to see urologist due to ongoing urinary symptoms  She tells me she was seen by her ophthalmologist who had punctal plugs without much relief of her dry eyes, she had specialized contact lenses which did result in improvement of her dryness on the left side  Main issue now is intermittent joint pain affecting her hands, associated with morning stiffness and difficulty performing daily tasks such as opening jars, similar symptoms are noted in the ankles and feet, she has never been on hydroxychloroquine which we discussed today and seems to be open to trying prednisone    No other complaints    Complete ROS was performed and is otherwise negative except for joint pain, imbalance and dryness as outlined above    No change in the past medical, surgical, and family history as reviewed in the records     Current medication list:  Reviewed      Physical Examination:    VITAL SIGNS: BP (!) 147/78 (BP Source: Arm, Right Upper, Patient Position: Sitting)  - Pulse 73  - Temp 36.7 ???C (98.1 ???F) (Oral)  - Resp 14  - Ht 158.8 cm (62.52)  - Wt 66 kg (145 lb 6.4 oz)  - SpO2 97%  - BMI 26.15 kg/m???      GENERAL APPEARANCE: The patient is a well-nourished adult in no acute distress    EYES: No scleral erythema or conjunctival injection.    ENT: No oral ulcers or parotid enlargement.    NECK: No masses or thyroid enlargement.    LYMPHATIC: No cervical, supraclavicular lymphadenopathy    CARDIOVASCULAR: Heart rhythm is regular. No murmurs    CHEST: Normal vesicular breath sounds. No wheezing or crackles    EXTREMITIES: There is no evidence of cyanosis, or edema.    SKIN: No rash, palpable purpura, digital ulcers, abnormal thickening    MUSCULOSKELETAL: The joints of the upper and lower extremities have full range of motion, no tenderness, swelling, deformity except  There is a clear synovitis of the first second and third MCP of the right hand but also the right third PIP, transmetatarsal tenderness was noted bilaterally    Schirmer's test  OS 3 mm  OD 4 mm     Unstimulated salivary flow rate= 0.16 ml/min                                   Assessment and plan:  1.  Primary Sjogren's syndrome, she fulfills 2016 classification criteria based on the presence of  sicca syndrome, possibly related myalgia and arthralgia, objective evidence of dry eyes and dry mouth, positive ANA and rheumatoid factor and a positive lip biopsy with focus score of 2  I recommended hydroxychloroquine and provided her educational material, I think this will help her synovitis detected on examination today, in the meantime, we will start her on prednisone taper at 15 mg daily over 3 weeks side effects of weight gain and other complications were discussed    She will let me know how she feels on this therapy and we will go ahead and discuss hydroxychloroquine during her next visit    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 We apologize for any inadvertent inconvenience from such an error.

## 2018-08-21 NOTE — Patient Instructions
4 week follow up    Start prednisone

## 2018-08-22 ENCOUNTER — Encounter: Admit: 2018-08-22 | Discharge: 2018-08-22

## 2018-08-22 ENCOUNTER — Ambulatory Visit: Admit: 2018-08-21 | Discharge: 2018-08-22

## 2018-08-22 DIAGNOSIS — M3500 Sicca syndrome, unspecified: Principal | ICD-10-CM

## 2018-08-22 NOTE — Progress Notes
Records Request    Medical records request for continuation of care:    Patient has appointment TOMORROW   with  Dr. Ricard Dillon .    Please fax records to [facility]  [phone]    Request records:    Office Notes (pcp most recent)    EKG's           Recent Cardiac Testing (any since 2013)    Any cardiac-related records    Recent Labs      Thank you,      [facility]  [facility]  [address] [place] [address]  Phone:  [phone]  Fax:  [phone]

## 2018-08-23 ENCOUNTER — Ambulatory Visit: Admit: 2018-08-23 | Discharge: 2018-08-24

## 2018-08-23 ENCOUNTER — Encounter: Admit: 2018-08-23 | Discharge: 2018-08-23

## 2018-08-23 DIAGNOSIS — I447 Left bundle-branch block, unspecified: Secondary | ICD-10-CM

## 2018-08-23 DIAGNOSIS — M1990 Unspecified osteoarthritis, unspecified site: Secondary | ICD-10-CM

## 2018-08-23 DIAGNOSIS — M549 Dorsalgia, unspecified: Secondary | ICD-10-CM

## 2018-08-23 DIAGNOSIS — K219 Gastro-esophageal reflux disease without esophagitis: Secondary | ICD-10-CM

## 2018-08-23 DIAGNOSIS — E162 Hypoglycemia, unspecified: Secondary | ICD-10-CM

## 2018-08-23 DIAGNOSIS — E559 Vitamin D deficiency, unspecified: Secondary | ICD-10-CM

## 2018-08-23 DIAGNOSIS — C50212 Malignant neoplasm of upper-inner quadrant of left female breast: Secondary | ICD-10-CM

## 2018-08-23 DIAGNOSIS — Z9109 Other allergy status, other than to drugs and biological substances: Secondary | ICD-10-CM

## 2018-08-23 DIAGNOSIS — Z853 Personal history of malignant neoplasm of breast: Secondary | ICD-10-CM

## 2018-08-23 DIAGNOSIS — M810 Age-related osteoporosis without current pathological fracture: Secondary | ICD-10-CM

## 2018-08-23 DIAGNOSIS — F329 Major depressive disorder, single episode, unspecified: Secondary | ICD-10-CM

## 2018-08-23 DIAGNOSIS — E785 Hyperlipidemia, unspecified: Secondary | ICD-10-CM

## 2018-08-23 DIAGNOSIS — G629 Polyneuropathy, unspecified: Secondary | ICD-10-CM

## 2018-08-23 DIAGNOSIS — Z1379 Encounter for other screening for genetic and chromosomal anomalies: Secondary | ICD-10-CM

## 2018-08-23 DIAGNOSIS — R600 Localized edema: Secondary | ICD-10-CM

## 2018-08-23 DIAGNOSIS — M3500 Sicca syndrome, unspecified: Secondary | ICD-10-CM

## 2018-08-23 NOTE — Progress Notes
Kristen Norris is a 68 y.o. female.    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     TREATMENT SUMMARY:   This 68 year old postmenopausal female developed pain in the medial side of the left chest wall in 2/07. 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 She underwent left lumpectomy and sentinel lymph node sampling on 06/07/05. Lumpectomy revealed a 2.1 x 2-cm invasive ductal carcinoma, grade II. No angiolymphatic invasion. There was perineural invasion. 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 Margins were clear. 0/2 sentinel lymph nodes were involved. Oncotype DX was sent and came back with a high recurrence score of 33, which corresponds to a 10-year rate of distant recurrence of 23% with adjuvant tamoxifen alone. For adjuvant chemotherapy, she received four cycles of dose-dense AC and 10 weeks of taxol. Taxol had to be stopped at 10 weeks due to grade 3 peripheral neuropathy. She was placed on adjuvant Arimidex in 10/07. Radiation therapy was given in [place], [place] and completed in December 2007.  Adjuvant Femara since 11/24/05 - 11/2010.        Extensive family history of breast and ovarian cancer but genetic testing @ diagnosis not performed due to financial reasons; insurance would not cover.      Family History   Problem Relation Age of Onset   ??? Heart Disease Mother    ??? Hypoglycemia Mother    ??? Heart Disease Father         farmer   ??? Coronary Artery Disease Father    ??? Heart Attack Brother 45   ??? Complete Hysterectomy Sister         rotator cuff surgery   ??? Diabetes Type II Maternal Grandmother    ??? Cancer-Hematologic Daughter 47   ??? COPD Daughter         on oxygen   ??? Tobacco disorder Daughter         on Chantix   ??? Complete Hysterectomy Daughter 36   ??? None Reported Grandchild ??? Other Grandchild         wrestler-stomach problems   ??? None Reported Grandchild    ??? None Reported Grandchild    ??? None Reported Grandchild    ??? None Reported Grandchild    ??? None Reported Sister    ??? Heart murmur Sister    ??? Heart Disease Sister    ??? Osteopenia Sister    ??? Depression Brother 56        hung himself 05/2017   ??? Other Brother 0        died in utero-umbilical cord wrapped around him   ??? Cancer-Breast Paternal Aunt 55        bilateral mastectomy   ??? Cancer-Breast Other 60        paternal 1st cousin   ??? Cancer-Lung Other 64        paternal 1st cousin   ??? Cancer-Breast Other 40        paternal 1st cousin   ??? Cancer-Ovarian Other 25   ??? None Reported Other    ??? Cancer-Breast Other 21        paternal 1st cousin   ??? Brain Tumor Other 50        nephew   ??? Cancer-Lung Paternal Uncle 45   ??? Cervical Cancer  Neg Hx    ??? Cancer-Colon Neg Hx    ??? Cancer-Uterine Neg Hx    ??? Bleeding Disorders Neg Hx    ??? 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 S/P lumpectomy/SLNBX (0/2). She received adjuvant chemotherapy AC x 4 followed by weekly Taxol x 10 of 12 planned cycles; stopped due to grade 3 peripheral neuropathy. She received WBR in [place] which completed in 01/2006. Kristen Norris has an extensive family history of breast and ovarian cancer but genetic testing not done due to financial reasons. At her visit in 01/2016 she complained of horrible bony pain and bone scan was performed 02/05/2016 which was negative for osseous metastatic disease. She also had an elevated Hgb, Hct and RBC as well as hyperkalemia. Abdominal US was performed 02/05/2016 and showed a milk calcium cyst (6mm) in the left kidney, otherwise normal. She is feeling OK. Many losses this year. 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 She states that she became very emotional coming to the clinic today because her brother always brought her to her appointments at the [facility].    Review of Systems  Appetite fair.  Weight (BMI 26.0; # 146 <- 27.33; # 149 <- 27.18 <- 26.46 <- 25.9 <- 25.54 <- 25.15).   02/08/2018 [facility] myRisk Hereditary Cancer Test:  VUS APC c.1977C>A (p.Asn659Lys)   She is raising her 69 year old grandson.   Severe dry mouth and dry eye.  Completed femara therapy in 11/2010.    Patient reports that she is getting Prolia every 6 months (PCP). This treatment started in 03/2015.   Broke a left rib 03-17-17  and then caught a bad cold with coughing and broke another left rib 06/2017.  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   Fell in 12/2017; balance seems to be off.   Kristen Norris (younger brother hung himself 06-15-17; age 51).   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   Irregular heart rhythm; evaluated by cardiology Kristen Norris); monitor; ECHO is scheduled.   Now established with neurology/urology.   No fever or chills.   No headache or dizziness.   No SOB or cough.   No chest pain or palpitations.   No diagnosis of LUE lymphedema.   No numbness or tingling of the fingers or toes.  No weakness.   No skin rash.   Chronic lower back pain; has seen Dr. Rudell Norris in the past with epidural steroid injection (10/2010); which helped.   No nausea, vomiting, diarrhea, constipation or abdominal pain.   Patient states that she had a colonoscopy (Kristen Norris); states was negative and cannot remember date.   Probably > 10 years ago.   Total laparoscopic hysterectomy bilateral salpingooophorectomy 03/2011 (pathology negative) Family history of paternal first cousins x 3 with breast CA (50, 46, 10), 1 paternal aunt with breast CA (54) and 1 paternal cousin with ovarian CA. Patient has 2 daughters.   No hot flashes or vaginal dryness; patient is not sexually active.   No formal exercise.  Former smoker (  20 year 1ppd history); quit 1999.     Current Medication List:         ??? amitriptyline (ELAVIL) 25 mg tablet Take 25 mg by mouth at bedtime as needed.   ??? aspirin EC 81 mg tablet Take 81 mg by mouth daily. Taking every 3 days   ??? benzonatate (TESSALON PERLES) 100 mg capsule Take 100 mg by mouth every 8 hours as needed for Cough.   ??? CALCIUM CARBONATE (CALCIUM 500 PO) Take 1 Cap by mouth Daily.   ??? Cholecalciferol (Vitamin D3) (VITAMIN D) 1,000 unit cap Take 3 Caps by mouth daily.   ??? cyanocobalamin (vitamin B-12) 1,000 mcg/mL kit Inject 1,000 mcg to area(s) as directed every 30 days.   ??? cyclobenzaprine (FLEXERIL) 5 mg tablet Take 5 mg by mouth three times daily as needed for Muscle Cramps.   ??? fluticasone propionate (FLONASE) 50 mcg/actuation nasal spray, suspension Apply 1 spray to each nostril as directed daily.   ??? ibuprofen (MOTRIN) 600 mg tablet Take 1 Tab by mouth every 6 hours as needed for Pain.   ??? phenazopyrid-cran-vit C-B.coag (AZO URINARY TRACT HEALTH) 95 mg/250 mg-30 mg-15 mg tab Take  by mouth.   ??? potassium/magnesium (MAGNESIUM-POTASSIUM PO) Take  by mouth daily.   ??? predniSONE (DELTASONE) 5 mg tablet 3 tablets daily for 7 days then 2 tablets daily for 7 days then 1 tablets daily for 7 days then stop   ??? Syringe with Needle (Disp) (BD SAFETYGLIDE SYRINGE) 3 mL 25 x 5/8 syrg Use to inject vitamin B12          Objective   Vitals:    08/24/18 0935 08/24/18 0936   BP:  (!) 146/76   Pulse:  67   Resp:  16   Temp:  36.7 ???C (98.1 ???F)   TempSrc:  Oral   SpO2:  97%   Weight:  66.6 kg (146 lb 12.8 oz)   Height:  160 cm (63)   PainSc: Four        Body mass index is 26 kg/m???.     Pain Rating:   0       Pain Addressed:  N/A Fatigue Scale:6    ECOG performance status is 0, Fully active, able to carry on all pre-disease performance without restriction.Marland Kitchen    Physical Exam   Pulmonary/Chest:       Lymphadenopathy:     She has no cervical adenopathy.     She has no axillary adenopathy.        Right: No supraclavicular adenopathy present.        Left: No supraclavicular adenopathy present.   Vitals reviewed.  This is a 68 year old female in no acute distress, well developed.   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   Neck: No JVD, supple.   Chest: Wheezes noted in the bilateral lower lobes (unchanged).    CV:  RRR without murmur.   Abdomen: Soft, non-distended, non-tender, positive bowel sounds, no organomegaly.   Skin: No rash.   Extremities: No clubbing, cyanosis.   No evidence of LUE lymphedema.   Neuro: CN: II-XII grossly intact; no sensory or motor abnormalities noted.     CBC w/Diff    Lab Results   Component Value Date/Time    WBC 4.3 (L) 02/08/2018 09:44 AM    RBC 5.14 (H) 02/08/2018 09:44 AM    HGB 15.1 (H) 02/08/2018 09:44 AM  HCT 44.9 02/08/2018 09:44 AM    MCV 87.4 02/08/2018 09:44 AM    MCH 29.3 02/08/2018 09:44 AM    MCHC 33.5 02/08/2018 09:44 AM    RDW 13.8 02/08/2018 09:44 AM    PLTCT 269 02/08/2018 09:44 AM    MPV 8.1 02/08/2018 09:44 AM    Lab Results   Component Value Date/Time    NEUT 45 02/08/2018 09:44 AM    ANC 1.90 02/08/2018 09:44 AM    LYMA 40 02/08/2018 09:44 AM    ALC 1.70 02/08/2018 09:44 AM    MONA 12 02/08/2018 09:44 AM    AMC 0.50 02/08/2018 09:44 AM    EOSA 2 02/08/2018 09:44 AM    AEC 0.10 02/08/2018 09:44 AM    BASA 1 02/08/2018 09:44 AM    ABC 0.00 02/08/2018 09:44 AM        Comprehensive Metabolic Profile    Lab Results   Component Value Date/Time    NA 137 02/08/2018 09:44 AM    K 4.5 02/08/2018 09:44 AM    CL 102 02/08/2018 09:44 AM    CO2 29 02/08/2018 09:44 AM    GAP 6 02/08/2018 09:44 AM    BUN 15 02/08/2018 09:44 AM CR 0.72 02/08/2018 09:44 AM    GLU 118 (H) 02/08/2018 09:44 AM    GLU 98 09/23/2005 10:13 AM    Lab Results   Component Value Date/Time    CA 9.6 02/08/2018 09:44 AM    ALBUMIN 4.2 02/08/2018 09:44 AM    TOTPROT 6.8 02/08/2018 09:44 AM    ALKPHOS 110 02/08/2018 09:44 AM    AST 21 02/08/2018 09:44 AM    ALT 27 02/08/2018 09:44 AM    TOTBILI 0.4 02/08/2018 09:44 AM    GFR >60 02/08/2018 09:44 AM    GFRAA >60 02/08/2018 09:44 AM        Lab Results   Component Value Date    CA125 40 (H) 02/08/2018         BREAST IMAGING:   Bilateral mammogram 07/23/08: BIRAD 2-BENIGN   Left breast US 07/23/08: BIRAD 1-NEGATIVE   Bilateral mammogram 08/05/09: BIRAD 1-NEGATIVE   Bilateral mammogram 12/12011: BIRADS 1  Bilateral breast US 01/2010: Multiple real-time grayscale images were utilized to evaluate the numerous sites of palpable fullness within the right upper breast, right medial breast at the 3:00 position, left breast 12:00 position, left breast 11:00 position and the left periareolar region superiorly. No masses are seen. There is no evidence of malignancy Impression:  ASSESSMENT: BIRAD 1-NEGATIVE.   Bilateral mammograms 02/02/2011: BIRADS 1  Bilateral DMAMM (TOMO) and Left breast ultrasound 02/07/2012:    FINDINGS:  There are scattered fibroglandular densities. 3-D (tomosynthesis) images were performed in addition to 2D ammograms. No masses, densities or calcifications to suggest malignancy. No change when compared to prior studies.    FINDINGS:  Ultrasound was performed to evaluate the area of palpable finding and pain detected by the patient at 12:00 5 cm from the nipple. At this location no abnormalities are present. Additional imaging was performed at areas of skin marking where referring provider palpated a finding. These are located at 1230 o'clock 4 cm from the nipple 11:00 6 cm from the nipple and 1130 5 cm from the nipple. At the locations no abnormalities are present. Scar from prior lumpectomy is noted at 10:00 7 cm from the nipple.  Imaging of the left axilla is unremarkable.  IMPRESSION:  ASSESSMENT: BIRAD 2-BENIGN.    Bilateral mammogram (TOMO)  02/05/2013:  ASSESSMENT: BIRAD 2-BENIGN  Bilateral mammogram (TOMO) 02/04/2014: Results pending at this dictation  ASSESSMENT: BIRAD 4-Suspicious (Overall)  DIAG MAMMO BL/T: BIRAD 4-suspicious abnormality in both breasts.  [place] TARGET BILAT: BIRAD 1-negative.  RECOMMENDATION:  Stereotactic biopsy of the left breast. ???  Surgical pathology:   Left MTBX 03/10/2014:   Final Diagnosis:   A. Breast, left breast 10:00 with calcs, needle core biopsies: Atrophic breast tissue with focal fibrosis and calcifications.   B. Breast, left breast 10:00 no calcs, needle core biopsies: Benign breast tissue with microcalcification within fibrotic stroma.   Bilateral screening mammogram TOMO 02/05/15: ACR BI-RADS??? Assessments: BIRAD 1-Negative  02/05/2016 BILATERAL MAMMOGRAM:  ACR BI-RADS??? Assessments: BIRAD 1-Negative  02/05/2016 TARGETED RIGHT BREAST ULTRASOUND: ACR BI-RADS??? Assessments: BIRAD 1-Negative  02/06/2017 BILATERAL MAMMOGRAM: ACR BI-RADS??? Assessments: BIRAD 2-Benign  02/08/2018 BILATERAL MAMMOGRAM (TOMO):  ASSESSMENT: BIRAD 2-Benign      BONE HEALTH:     BMD 03/2007 revealed T score of -2.3 in spine and -2.9 in femur (osteoporosis). There was improvement in BMD of both spine and hip  BMD 07/23/08: Osteoporosis, mild improvement in BMD.   BMD 08/05/09: Osteoporosis, minimal increase in BMD.   BMD 10/06/10: Stable osteoporosis  BMD 02/07/12: Persistent osteoporosis; improvement in lumbar spine; stable in femurs.   BMD 02/05/13:  OSTEOPOROSIS. CURRENT MINIMUM T SCORE -2.7 AT THE RIGHT FEMORAL NECK VERSUS PRIOR MINIMUM T SCORE -2.6 AT THE LEFT FEMORAL NECK.   BMD 02/04/2014: Stable findings of osteoporosis places patient at high risk for fracture.   BMD 02/05/2015: Osteoporosis, at high risk for fracture.  BMD 02/05/2016: Persistent findings of Osteoporosis.    IMAGING:   MRI L-SPINE 04/2010: 1. 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  2. THERE IS MILD ANTERIOR SPURRING AT L1-L2 WITH MODERATE ENDPLATE CHANGES IN THE ANTEROSUPERIOR ASPECT OF THE L2 VERTEBRAL BODY AS DESCRIBED. MOST LIKELY THIS REPRESENTS REACTIVE DEGENERATIVE CHANGES. NEOPLASM IS FELT TO BE LESS LIKELY.     BONE SCAN 04/09/2010: NO METASTATIC DISEASE.     CXR 02/07/2012:  NO ACUTE DISEASE IN THE CHEST.     CT CHEST 02/09/2012: NO EVIDENCE OF THORACIC METASTATIC DISEASE.     MRI HEAD 02/11/2013: UNREMARKABLE ENHANCED AND UNENHANCED MRI SCAN OF THE HEAD.     ABDOMINAL ULTRASOUND 02/05/2016: Small left inferior pole milk of calcium cyst. Otherwise, normal abdominal ultrasound.    BONE SCAN 02/05/2016: No scintigraphic evidence of disseminated osseous metastatic disease. Focal increased uptake in the left cervical thoracic junction which may reflect degenerative arthropathy.     CT CHEST/ABDOMEN 08/24/2016:   1. No intra-abdominal inflammatory mass, ascites, or bowel obstruction. ???The appendix is normal.  2. Mild distal colonic diverticulosis  3. Small, focal area of tree-in-Kristen Norris type opacities within the right middle lobe that has developed since January 2014. ???This is nonspecific and may represent scarring related to a prior infection or represent infectious bronchiolitis. ???  Patient instructed to F/U with primary care Dutch Gray, PA) in [place], [place] for further evaluation.     BONE HEALTH:   02/08/2018 BMD:   LUMBAR SPINE, L1-L2  Current: 0.969 g/cm2, T-score of -1.7 ??? ???   Previous: 1.000 g/cm2, T-score of -1.4    LEFT FEMORAL NECK ???  Current: 0.658 g/cm2, T-score of -2.7 ??? ???   Previous: 0.677 g/cm2, T-score of -2.6    LEFT TOTAL HIP ???  Current: 0.731 g/cm2, T-score of -2.2  Previous: 0.757 g/cm2, T-score of -2.0    RIGHT FEMORAL NECK  Current: 0.643 g/cm2, T-score of -2.8 ??? ???   Previous: 0.673 g/cm2, T-score of -2.6    RIGHT TOTAL HIP Current: 0.726 g/cm2, T-score of -2.2   Previous: 0.773 g/cm2, T-score of -1.9 ???    Assessment and Plan:    1. A 68 year old postmenopausal white female with T2 Grade II, ER positive, HER-2/neu negative left breast cancer, S/P adjuvant chemotherapy, dose-dense Adriamycin and Cytoxan and 10 weeks of Taxol. Currently on adjuvant Femara since 10/07 and finished 5 years 11/2010. Menopausal symptoms and depression are stable per patient she stopped Effexor due to cost (about 2 years ago).   2. Osteoporosis; BMD 02/05/2013 (osteoporosis).  Patient continues on boniva (PCP prescribes). Repeat BMD 01/2016; showed continued stable osteoporosis. Patient started Prolia in 03/2015, has only received 2 injections; then no further treatment. BMD 02/08/2018; progression of osteoporosis. Patient will be seeing a rheumatologist (trying to rule out auto-immune disease) on 03/05/2018. I have encouraged her to discuss alternative medications for osteoporosis.   3. Low back pain:MRI Lumbar spine with DJD; negative bone scan in 04/2010; has seen Dr. Welton Flakes in the past with steroid injection in 10/2010.     4. Family history of breast and ovarian cancer: no genetic testing due to cost. 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 Phoned patient with results 03/09/2018 (12:40). Negative results phoned to patient; no deleterious mutations found. A VUS (Variant of Unknown/Uncertain Significance) was found in the gene APC. This result requires no clinical action at this time (based on currently available information it is unclear whether this variant is a pathogenic mutation or benign variant). A VUS is a change, or variant, in a gene that has never been seen before or because of conflicting or incomplete information in the medical literature, its association with cancer risk is unknown. In other words, it cannot be determined yet whether this genetic variant is associated with an increased risk of cancer or is a harmless, normal genetic variant.  In the absence of a definitive mutation, the risk for future cancers and medical management recommendations should be based on personal and family history of cancer.  5. Reviewed signs to watch for that could indicate a local or distant recurrence. Patient will call our office with any new signs.   Local recurrence  Signs and symptoms of local recurrence within the same breast may include:   A new lump in your breast or irregular area of firmness   A new thickening in your breast area   A new pulling back of the skin or dimpling at the lumpectomy site   Skin inflammation or area of redness   Flattening or indentation of your nipple or other nipple changes  A regional breast cancer recurrence means the cancer has come back in the lymph nodes in your armpit or collarbone area. Signs and symptoms of regional recurrence may include:   A lump or swelling in the lymph nodes under your arm or in the groove above your collarbone   Swelling of your arm (this could be related to lymphedema or even a blood clot)  Persistent pain in your arm and shoulder   Increasing loss of sensation in your arm and hand  Distant (metastatic) recurrence   A distant, or metastatic, recurrence means the cancer has traveled to distant parts of the body, most commonly the bones, liver and lungs. The signs and symptoms may include:  Pain, such as chest or bone pain   Persistent, dry cough   Difficulty breathing   Loss of appetite   Persistent nausea, vomiting or weight loss   Swelling in the abdomen  Severe headaches  When to call our office   You know your body best ??? what feels normal and what doesn't. It's important to be aware of the signs and symptoms of recurrent breast cancer, such as:   New and persistent pain   Changes or new lumps in your breast  Weight loss   Shortness of breath If you experience any signs and symptoms that might suggest a recurrence, call our office. Patient verbalized understanding and phone numbers provided.    6. RTC 02/2019 with bilateral mammogram/lab.       I have spent 30 minutes with the patient today. 25 minutes spent face to face in evaluation, management, counseling and coordination of care functions.   Collaborating physician: Joni Reining, MD  NPI # [id number]         Ernie Hew, APRN

## 2018-08-23 NOTE — Progress Notes
Date of Service: 08/23/2018    Kristen Norris is a 68 y.o. female.       HPI     Kristen Norris was in the [place] office today for consultation regarding episodic near syncope.  She was referred by allergist.    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    She has a diagnosis of rheumatoid arthritis and Sjogren's syndrome.  Over the past several months she has developed episodes of lightheadedness that last for a number of seconds, never as long as a minute, however.  There is no particular provoking factor and she says that she gets 1 of these episodes once or twice a week.  She has not fallen down or had frank syncope and there is no associated palpitation, breathlessness, or chest discomfort.    She does have some occasional palpitations, described as skipped beats.  Rarely she will have a sensation of her heart racing.  Again, these are not associated with the near syncopal episodes.    She has a history of breast carcinoma for which she was treated with chemotherapy and radiation back in 2007.  She had a lot of peripheral edema at that time but it resolved.  Over the past several months she started to have more trouble with peripheral edema again, developing quite a bit of dependent edema toward the end of the day.    She denies any chest discomfort and she has had no TIA or stroke symptoms.         Vitals:    08/23/18 1327 08/23/18 1329 08/23/18 1332 08/23/18 1334   BP: (!) 142/74 (!) 142/78 138/80 128/82   BP Source: Arm, Right Upper Arm, Right Upper Arm, Right Upper Arm, Right Upper   Pulse: 71 80 75 81   SpO2: 96%      Weight: 65.8 kg (145 lb)      Height: 1.6 m (5' 3)      PainSc: Zero        Body mass index is 25.69 kg/m???.     Past Medical History  Patient Active Problem List    Diagnosis Date Noted   ??? Xerostomia 02/23/2018   ??? Rheumatoid factor positive 02/23/2018   ??? Arthralgia of right temporomandibular joint 02/23/2018 ??? S/P hysterectomy with oophorectomy 07/15/2011   ??? Pelvic pain in female 07/15/2011   ??? Cystocele 07/15/2011   ??? Left bundle branch block 03/17/2011   ??? Preoperative evaluation to rule out surgical contraindication 03/17/2011   ??? Abnormal electro-oculogram 03/17/2011   ??? Ovarian cyst, left 01/05/2011   ??? 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 Thoracic or lumbosacral neuritis or radiculitis, unspecified 04/18/2010   ??? S/P Lumpectomy of Breast-Surgical Oncology Notes 12/17/2007     DIAGNOSIS:  Left breast Invasive Ductal Cancer diagnosed 3/07,  STAGE II-A  T2 N0 M0  Markers:  ER 100%, PR 0%, Ki67 15%, HER-2/neu negative.     PROCEDURE: Left lumpectomy with sentinel node biopsy 06/07/2005    HPI: This 68 year old postmenopausal female developed pain in the medial side of the left chest wall in 2/07.  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 She underwent left lumpectomy and sentinel lymph node sampling on 06/07/05. Lumpectomy revealed a 2.1 x 2-cm invasive ductal carcinoma, grade  II. No angiolymphatic invasion. There was perineural invasion. 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 Margins were clear. 0/2 sentinel lymph nodes were involved.   Oncotype DX was sent and came back with a high recurrence score of 33, which corresponds to a 10-year rate of distant recurrence of 23% with adjuvant tamoxifen alone. For adjuvant chemotherapy, she received four cycles of dose-dense AC and 10 weeks of taxol by Dr. Osborn Coho.  . Taxol had to be stopped at 10 weeks due to grade 3 peripheral neuropathy. She was placed on adjuvant Arimidex in 10/07.  Radiation therapy was given by Dr. Joycelyn Man in [place], [place] and completed in December 2007.   She was last seen in 12/2007 with c/o left axillary burning.  On exam there was a stable scar retraction at left lumpectomy site in left UIQ. No palpable mass, no nipple or areolar change. Right breast: palpable fullness with tenderness right UIQ most consistent with prominent pectoral muscle (patient works heavy labor as gardener). Left diagnostic mammogram 11/09 was class 2. Right breast with palpable tender fullness right upper breast/chest wall, benign clinically. Right breast sonogram also negative 11/09.  Follow-up exam 03/2008 assessment:  Right breast pain- negative clinical exam as well as negative Ultrasound. Findings and complaint most consistent with musculoskeletal etiology (patient works in Aeronautical engineer).  She now returns for a 3 month f/u exam to confirm this impression.          ??? GERD (gastroesophageal reflux disease)    ??? Depression    ??? Environmental allergies    ??? PN (Peripheral Neuropathy)    ??? Hypoglycemia    ??? Osteoporosis      BMD  T-score (-2.3) spine and (-2.9) femur on Actonel     ??? Vitamin D deficiency    ??? Back pain 07/18/2007     The patient is currently experiencing pain.      Location: middle back   Intensity (1-10): 6   Description: dull   Duration: constant  Fatigue (0-10): 7     ??? Pain in joint, ankle and foot 10/05/2006   ??? Malignant neoplasm of other specified sites of female breast 06/15/2006     DIAGNOSIS:  Left breast cancer, 3/07.     STAGE:  II-A;  T2N0M0, ER positive, HER-2/neu negative.     PAST THERAPY:  This 68 year old postmenopausal female developed pain in the medial side of the left chest wall in 2/07.  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 She underwent left lumpectomy and sentinel lymph node sampling on 06/07/05. Lumpectomy revealed a 2.1 x 2-cm invasive ductal carcinoma, grade II. No angiolymphatic invasion. There was perineural invasion. 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 Margins were clear. 0/2 sentinel lymph nodes were involved. Oncotype DX was sent and came back with a high recurrence score of 33, which corresponds to a 10-year rate of distant recurrence of 23% with adjuvant tamoxifen alone. For adjuvant chemotherapy, she received four cycles of dose-dense AC and 10 weeks of taxol. Taxol had to be stopped at 10 weeks due to grade 3 peripheral neuropathy. She was placed on adjuvant Arimidex in 10/07.  Radiation therapy was given in [place], [place] and completed in December 2007.    PRESENT THERAPY:  Adjuvant Arimidex since 11/24/05.         ??? Edema 02/28/2006         Review of Systems  Constitution: Negative.   HENT: Negative.    Eyes: Positive for blurred vision.   Cardiovascular: Positive for irregular heartbeat.   Respiratory: Positive for shortness of breath.    Endocrine: Negative.    Hematologic/Lymphatic: Negative.    Skin: Negative.    Musculoskeletal: Positive for arthritis.   Gastrointestinal: Negative.    Genitourinary: Positive for frequency.   Neurological: Negative.    Psychiatric/Behavioral: Negative.    Allergic/Immunologic: Negative.        Physical Exam    Physical Exam   General Appearance: no distress   Skin: warm, no ulcers or xanthomas   Digits and Nails: no cyanosis or clubbing   Eyes: conjunctivae and lids normal, pupils are equal and round   Teeth/Gums/Palate: dentition unremarkable, no lesions   Lips & Oral Mucosa: no pallor or cyanosis   Neck Veins: normal JVP , neck veins are not distended   Thyroid: no nodules, masses, tenderness or enlargement   Chest Inspection: chest is normal in appearance   Respiratory Effort: breathing comfortably, no respiratory distress   Auscultation/Percussion: lungs clear to auscultation, no rales or rhonchi, no wheezing   PMI: PMI not enlarged or displaced   Cardiac Rhythm: regular rhythm and normal rate   Cardiac Auscultation: S1, S2 normal, no rub, no gallop   Murmurs: no murmur Peripheral Circulation: normal peripheral circulation   Carotid Arteries: normal carotid upstroke bilaterally, no bruits   Radial Arteries: normal symmetric radial pulses   Abdominal Aorta: no abdominal aortic bruit   Pedal Pulses: normal symmetric pedal pulses   Lower Extremity Edema: no lower extremity edema   Abdominal Exam: soft, non-tender, no masses, bowel sounds normal   Liver & Spleen: no organomegaly   Gait & Station: walks without assistance   Muscle Strength: normal muscle tone   Orientation: oriented to time, place and person   Affect & Mood: appropriate and sustained affect   Language and Memory: patient responsive and seems to comprehend information   Neurologic Exam: neurological assessment grossly intact   Other: moves all extremities      Problems Addressed Today  Encounter Diagnoses   Name Primary?   ??? Left bundle branch block Yes   ??? Localized edema        Assessment and Plan       Left bundle branch block  Today's EKG shows persistence of left bundle branch block.  Although unlikely, there may be a possibility of intermittent high-grade conduction block that would be responsible for her episodes of near syncope.  I have arranged for a 2-week ZIO patch cardiac monitor.    Edema  Given her remote history of chemotherapy for breast carcinoma and a left bundle branch block on her EKG I am a little concerned that she may have a cardiomyopathy with some degree of early heart failure.  Have ordered an echocardiogram to be done within the next couple of weeks.      Current Medications (including today's revisions)  ??? amitriptyline (ELAVIL) 25 mg tablet Take 25 mg by mouth at bedtime as needed.   ??? aspirin EC 81 mg tablet Take 81 mg by mouth daily. Taking every 3 days   ??? benzonatate (TESSALON PERLES) 100 mg capsule Take 100 mg by mouth every 8 hours as needed for Cough.   ??? CALCIUM CARBONATE (CALCIUM 500 PO) Take 1 Cap by mouth Daily. ??? Cholecalciferol (Vitamin D3) (VITAMIN D) 1,000 unit cap Take 3 Caps by mouth daily.   ??? cyanocobalamin (vitamin B-12) 1,000  mcg/mL kit Inject 1,000 mcg to area(s) as directed every 30 days.   ??? cyclobenzaprine (FLEXERIL) 5 mg tablet Take 5 mg by mouth three times daily as needed for Muscle Cramps.   ??? fluticasone propionate (FLONASE) 50 mcg/actuation nasal spray, suspension Apply 1 spray to each nostril as directed daily.   ??? ibuprofen (MOTRIN) 600 mg tablet Take 1 Tab by mouth every 6 hours as needed for Pain.   ??? phenazopyrid-cran-vit C-B.coag (AZO URINARY TRACT HEALTH) 95 mg/250 mg-30 mg-15 mg tab Take  by mouth.   ??? potassium/magnesium (MAGNESIUM-POTASSIUM PO) Take  by mouth daily.   ??? predniSONE (DELTASONE) 5 mg tablet 3 tablets daily for 7 days then 2 tablets daily for 7 days then 1 tablets daily for 7 days then stop   ??? Syringe with Needle (Disp) (BD SAFETYGLIDE SYRINGE) 3 mL 25 x 5/8 syrg Use to inject vitamin B12

## 2018-08-23 NOTE — Assessment & Plan Note
Today's EKG shows persistence of left bundle branch block.  Although unlikely, there may be a possibility of intermittent high-grade conduction block that would be responsible for her episodes of near syncope.  I have arranged for a 2-week ZIO patch cardiac monitor.

## 2018-08-23 NOTE — Assessment & Plan Note
Given her remote history of chemotherapy for breast carcinoma and a left bundle branch block on her EKG I am a little concerned that she may have a cardiomyopathy with some degree of early heart failure.  Have ordered an echocardiogram to be done within the next couple of weeks.

## 2018-08-24 ENCOUNTER — Encounter: Admit: 2018-08-24 | Discharge: 2018-08-24

## 2018-08-24 DIAGNOSIS — Z853 Personal history of malignant neoplasm of breast: Secondary | ICD-10-CM

## 2018-08-24 DIAGNOSIS — G629 Polyneuropathy, unspecified: Secondary | ICD-10-CM

## 2018-08-24 DIAGNOSIS — M810 Age-related osteoporosis without current pathological fracture: Secondary | ICD-10-CM

## 2018-08-24 DIAGNOSIS — Z1379 Encounter for other screening for genetic and chromosomal anomalies: Secondary | ICD-10-CM

## 2018-08-24 DIAGNOSIS — E785 Hyperlipidemia, unspecified: Secondary | ICD-10-CM

## 2018-08-24 DIAGNOSIS — M3500 Sicca syndrome, unspecified: Secondary | ICD-10-CM

## 2018-08-24 DIAGNOSIS — Z1273 Encounter for screening for malignant neoplasm of ovary: Secondary | ICD-10-CM

## 2018-08-24 DIAGNOSIS — Z923 Personal history of irradiation: Secondary | ICD-10-CM

## 2018-08-24 DIAGNOSIS — F329 Major depressive disorder, single episode, unspecified: Secondary | ICD-10-CM

## 2018-08-24 DIAGNOSIS — E162 Hypoglycemia, unspecified: Secondary | ICD-10-CM

## 2018-08-24 DIAGNOSIS — Z9109 Other allergy status, other than to drugs and biological substances: Secondary | ICD-10-CM

## 2018-08-24 DIAGNOSIS — M549 Dorsalgia, unspecified: Secondary | ICD-10-CM

## 2018-08-24 DIAGNOSIS — K219 Gastro-esophageal reflux disease without esophagitis: Secondary | ICD-10-CM

## 2018-08-24 DIAGNOSIS — C50212 Malignant neoplasm of upper-inner quadrant of left female breast: Secondary | ICD-10-CM

## 2018-08-24 DIAGNOSIS — M1990 Unspecified osteoarthritis, unspecified site: Secondary | ICD-10-CM

## 2018-08-24 DIAGNOSIS — E559 Vitamin D deficiency, unspecified: Secondary | ICD-10-CM

## 2018-08-24 LAB — CA125: Lab: 30 U/mL — ABNORMAL HIGH (ref <35)

## 2018-08-24 NOTE — Patient Instructions
Thank you for coming to see [place] today.   Please call our office or send a message through [facility] if you have any questions or concerns.                                                                                                                                                           Sheppard Coil RN, BSN, OCN, CBCN, CN-BN  Clinical Nurse Coordinator for Tona Sensing, APRN-BC        Breast Cancer Survivorship/High Risk Breast Clinic  Lymphedema Screening & Prevention Program   [phone] (phone)  [phone] (fax)  [phone] (scheduling)  [email or web address]      [facility]: Monday, Thursday, Friday   Admin: Wednesday  [address] [place], [place] [address]   [facility]: Tuesday  [address]  [place], [place] [address]

## 2018-08-28 ENCOUNTER — Encounter: Admit: 2018-08-28 | Discharge: 2018-08-28

## 2018-08-28 NOTE — Telephone Encounter
Pt called and states that she is wanting Dr. Ricard Dillon' recommendations regarding her event monitor.  She is planning on going to [place] and will be there from  8/2 - 8/8.  She is scheduled for her echo and ziopatch placement on 7/27.  She is wanting to verify that it will be okay for her to wear the monitor being in [place], or if we should wait and complete the monitor after she returns from her trip.    She has had about 4 episodes of palpitations since her appointment with Dr. Ricard Dillon.  She states that it feels like she is skipping a beat or two.  She does not know when they are going to happen, or if anything triggers the episodes.  She denies any other symptoms of complaints.    Will route to Dr. Ricard Dillon for recommendations on timing of the event monitor and will follow up with the patient.

## 2018-08-29 NOTE — Telephone Encounter
Michiel Cowboy, MD  Baldwin Crown, RN   Caller: Unspecified (Yesterday, 1:41 PM)            I would recommend that she wait until she returns from [place] to apply the [place], thanks.      Called and discussed with patient.  She is agreeable with plan.  She will call us to reschedule the ziopatch once she returns from [place].

## 2018-08-30 ENCOUNTER — Encounter: Admit: 2018-08-30 | Discharge: 2018-08-30

## 2018-09-03 ENCOUNTER — Ambulatory Visit: Admit: 2018-09-03 | Discharge: 2018-09-04

## 2018-09-03 ENCOUNTER — Encounter: Admit: 2018-09-03 | Discharge: 2018-09-03

## 2018-09-03 DIAGNOSIS — I447 Left bundle-branch block, unspecified: Secondary | ICD-10-CM

## 2018-09-03 MED ORDER — PERFLUTREN LIPID MICROSPHERES 1.1 MG/ML IV SUSP
1-20 mL | Freq: Once | INTRAVENOUS | 0 refills | Status: CP | PRN
Start: 2018-09-03 — End: ?

## 2018-09-05 ENCOUNTER — Encounter: Admit: 2018-09-05 | Discharge: 2018-09-05

## 2018-09-05 NOTE — Telephone Encounter
Results and recommendations called to patient

## 2018-09-05 NOTE — Telephone Encounter
-----   Message from Michiel Cowboy, MD sent at 09/05/2018  7:45 AM CDT -----  [facility], can you please let her know this looks OK?  We'll see what the cardiac monitor shows.  Thanks.    Cc:  Lorenda Peck, PA

## 2018-09-18 ENCOUNTER — Encounter: Admit: 2018-09-18 | Discharge: 2018-09-18

## 2018-09-18 DIAGNOSIS — M1990 Unspecified osteoarthritis, unspecified site: Secondary | ICD-10-CM

## 2018-09-18 DIAGNOSIS — C50212 Malignant neoplasm of upper-inner quadrant of left female breast: Secondary | ICD-10-CM

## 2018-09-18 DIAGNOSIS — M3500 Sicca syndrome, unspecified: Secondary | ICD-10-CM

## 2018-09-18 DIAGNOSIS — E162 Hypoglycemia, unspecified: Secondary | ICD-10-CM

## 2018-09-18 DIAGNOSIS — M549 Dorsalgia, unspecified: Secondary | ICD-10-CM

## 2018-09-18 DIAGNOSIS — G629 Polyneuropathy, unspecified: Secondary | ICD-10-CM

## 2018-09-18 DIAGNOSIS — Z9109 Other allergy status, other than to drugs and biological substances: Secondary | ICD-10-CM

## 2018-09-18 DIAGNOSIS — N319 Neuromuscular dysfunction of bladder, unspecified: Secondary | ICD-10-CM

## 2018-09-18 DIAGNOSIS — F329 Major depressive disorder, single episode, unspecified: Secondary | ICD-10-CM

## 2018-09-18 DIAGNOSIS — K219 Gastro-esophageal reflux disease without esophagitis: Secondary | ICD-10-CM

## 2018-09-18 DIAGNOSIS — E559 Vitamin D deficiency, unspecified: Secondary | ICD-10-CM

## 2018-09-18 DIAGNOSIS — E785 Hyperlipidemia, unspecified: Secondary | ICD-10-CM

## 2018-09-18 DIAGNOSIS — M810 Age-related osteoporosis without current pathological fracture: Secondary | ICD-10-CM

## 2018-09-18 DIAGNOSIS — Z1379 Encounter for other screening for genetic and chromosomal anomalies: Secondary | ICD-10-CM

## 2018-09-18 DIAGNOSIS — Z853 Personal history of malignant neoplasm of breast: Secondary | ICD-10-CM

## 2018-09-18 NOTE — Progress Notes
Exam Date: 09/18/18    Name: Kristen Norris  MR#: [id number]  Date of Birth: September 28, 1950  Age: 68 y.o.  Gender: female   Referring Physician: [place]  [id number] [address]  [place], [place] [address]     Clinical History: See progress note.    Physical Exam: Reflexes 2.    Findings:     ? Nerve conduction studies:   o Right ulnar motor study showed 43m/s drop in conduction velocity across the elbow.  o Right sural, median, and ulnar sensory studies showed prolonged latencies and increased amplitude, likely technical due to low temperature.    ? Needle electrode examination:   o All muscles tested were normal.      Conclusion:   1. 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  2. No electrodiagnostic evidence for large fiber polyneuropathy.      Robyne Peers, M.D.  Assistant Professor of Neurology            Motor Nerve Conduction:              Nerve and Site   Lat  ms Amp  mV Dur  ms Segment   Lat Diff  ms Dist  mm CV   m/s   Peroneal.R to Extensor digitorum brevis.R     Ankle 5.5 4.2 6.5 Extensor digitorum brevis-Ankle 5.5 80    Fibula (head) 10.8 4.0 7.3 Ankle-Fibula (head) 5.3 255 48   Popliteal fossa 12.8 4.2 7.3 Fibula (head)-Popliteal fossa 2.0 100 50   Tibial.R to Abductor hallucis.R     Ankle 5.9 15.3 5.0 Abductor hallucis-Ankle 5.9 80    Popliteal fossa 11.7 12.3 7.5 Ankle-Popliteal fossa 5.8 340 59   Median.R to Abductor pollicis brevis.R     Wrist 4.0 8.0 5.7 Abductor pollicis brevis-Wrist 4.0 80    Elbow 7.9 8.2 6.0 Wrist-Elbow 3.9 245 63   Ulnar.R to Abductor digiti minimi (manus).R     Wrist 2.8 8.8 6.6 Abductor digiti minimi (manus)-Wrist 2.8 70    Below elbow 5.5 8.2 6.7 Wrist-Below elbow 2.6 170 65   Above elbow 7.5 8.2 6.9 Below elbow-Above elbow 2.0 100 50     F-waves:        Nerve   M-Lat  ms F-Lat  ms        Peroneal.R 6.4 53.4        Tibial.R  53.9        Median.R 3.7 28.0      Sensory and Mixed Nerve Conduction: Nerve and Site   Onset Lat ms Peak  Lat ms Amp  mV Segment   Lat Diff  ms Dist  mm CV  m/s Temp   ???C   Sural.R to Ankle.R        Lower leg 3.4 4.6 28 Ankle-Lower leg 3.4 140 41    Median.R to Digit II (index finger).R        Wrist 2.7 3.7 36 Digit II (index finger)-Wrist 2.7 140 51    Ulnar.R to Digit V (little finger).R        Wrist 2.4 3.2 21 Digit V (little finger)-Wrist 2.4 110 46    Radial.R to Anatomical snuff box.R        Forearm 1.8 2.4 27 Anatomical snuff box-Forearm 1.8 100 54            Needle EMG Examination:      Muscle Insertion Spontaneous Activity Volutional MUAPs Comments  Activity Fibs PSW Fasc Other Effort Recruit Dur Amp Poly     Tibialis anterior.R Normal 0 0 None  Normal Normal Normal Normal None    Gastrocnemius.R Normal 0 0 None  Normal Normal Normal Normal None    Vastus lateralis.R Normal 0 0 None  Normal Normal Normal Normal None    1st dorsal interosseous.R Normal 0 0 None  Normal Normal Normal Normal None    Pronator teres.R Normal 0 0 None  Normal Normal Normal Normal None    Triceps brachii.R Normal 0 0 None  Normal Normal Normal Normal None          Procedure Time Out Check List:  Prior to the start of the procedure, I personally confirmed the following:    Site Marking Verified: Yes, as appropriate  Patient Identity (name & date of birth): Yes  Procedure: Yes  Site: Yes  Body Part: right arm and leg    The risks of the procedure, including infection, bleeding, pain and skin changes, were discussed with the patient.

## 2018-09-19 ENCOUNTER — Ambulatory Visit: Admit: 2018-09-18 | Discharge: 2018-09-19

## 2018-09-19 ENCOUNTER — Encounter: Admit: 2018-09-19 | Discharge: 2018-09-19

## 2018-09-19 DIAGNOSIS — G901 Familial dysautonomia [Riley-Day]: Secondary | ICD-10-CM

## 2018-09-19 DIAGNOSIS — G990 Autonomic neuropathy in diseases classified elsewhere: Secondary | ICD-10-CM

## 2018-09-19 DIAGNOSIS — G629 Polyneuropathy, unspecified: Secondary | ICD-10-CM

## 2018-09-19 NOTE — Telephone Encounter
Pt called, her Ophthalmologist cleared her to start hcq, and her cardiologist is also aware and will monitor her ekg for qt prolongation. She just needs the script now.         I can get copy of her eye exam, sent to [place] when I call her.

## 2018-09-19 NOTE — Telephone Encounter
Called and discussed Dr. Ricard Dillon' recommendations with patient.  She verbalized understanding and is agreeable to plan. She will callback if she starts the medication, she is going to discuss further with her rheumatologist.

## 2018-09-19 NOTE — Telephone Encounter
-----   Message from Betsy Pries, RN sent at 09/13/2018 11:44 AM CDT -----  Regarding: RE: Please ask [place] with [place].  Okay to start hydroxychloroquinine, but will need an EKG one week after starting.      ----- Message -----  From: Baldwin Crown, RN  Sent: 09/13/2018  To: [facility] Nurse [place]  Subject: Please ask SDO                                   This patient called and states that her rheumatologist was considering hydroxychloroquinine for this patient (I'm not 100% for sure what ailment he is treating).  She wanted to know if it Dr. Ricard Dillon thought it would be safe for her.  Her echo showed a LBB - she is due to have a ziopatch placed after she returns from her vacation to [place].      I'm not sure if he would want to know those results prior to having an opinion, but if you would please ask him in clinic, she would appreciate it.      Please let me know and I will follow up with the patient on Friday.

## 2018-09-20 ENCOUNTER — Encounter: Admit: 2018-09-20 | Discharge: 2018-09-20

## 2018-09-20 ENCOUNTER — Ambulatory Visit: Admit: 2018-09-20 | Discharge: 2018-09-21

## 2018-09-20 DIAGNOSIS — I447 Left bundle-branch block, unspecified: Secondary | ICD-10-CM

## 2018-09-20 MED ORDER — HYDROXYCHLOROQUINE 200 MG PO TAB
ORAL_TABLET | Freq: Every day | ORAL | 1 refills | 90.00000 days | Status: DC
Start: 2018-09-20 — End: 2018-11-23

## 2018-09-20 NOTE — Progress Notes
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 Gave her our number to call back with any issues if it comes off

## 2018-09-20 NOTE — Telephone Encounter
Called and spoke to patient. She stated understanding.

## 2018-09-20 NOTE — Progress Notes
irhythm enrollment complete

## 2018-09-20 NOTE — Telephone Encounter
Plaquenil ordered

## 2018-09-27 ENCOUNTER — Encounter: Admit: 2018-09-27 | Discharge: 2018-09-27

## 2018-09-27 NOTE — Telephone Encounter
Informed pt of EMG result. No additional questions at this time.

## 2018-10-02 ENCOUNTER — Encounter: Admit: 2018-10-02 | Discharge: 2018-10-02

## 2018-10-02 NOTE — Progress Notes
Patient presented to clinic for a nursing visit for an EKG after starting hydroxychloroquinine.  EKG - SR LBBB which appears unchanged from previous EKG.  QT - interval is prolonged at 472. Previously 464.  Pt has f/u scheduled with SDO.  Will route to [facility] for review.  Advised pt we would call if [facility] has recommendations.

## 2018-10-05 NOTE — Progress Notes
SDO reviewed EKG.  No med changes at this time.

## 2018-10-24 ENCOUNTER — Encounter: Admit: 2018-10-24 | Discharge: 2018-10-24 | Payer: MEDICARE

## 2018-10-24 NOTE — Telephone Encounter
-----   Message from Michiel Cowboy, MD sent at 10/23/2018 12:57 PM CDT -----  [facility], can you please let her know that the monitor does show episodes of fast SVT that might make her feel suddenly light headed.  I think we should probably try starting her on Toprol XL 50 mg/day if she's willing.  I should probably see her back in clinic in a month or so.  Thanks!    Cc:  Lorenda Peck, PA

## 2018-10-24 NOTE — Telephone Encounter
Results discussed with patient.  She has a follow up appointment scheduled on 9/24 with Dr. Ricard Dillon.  She would like to wait until she sees him to start a new medication as she wants to discuss it further.

## 2018-10-31 ENCOUNTER — Encounter: Admit: 2018-10-31 | Discharge: 2018-10-31 | Payer: MEDICARE

## 2018-10-31 ENCOUNTER — Ambulatory Visit: Admit: 2018-10-31 | Discharge: 2018-10-31 | Payer: MEDICARE

## 2018-10-31 MED ORDER — TROSPIUM 60 MG PO CP24
60 mg | ORAL_CAPSULE | Freq: Every day | ORAL | 3 refills | 30.00000 days | Status: DC
Start: 2018-10-31 — End: 2019-02-15

## 2018-10-31 NOTE — Patient Instructions
Here is a list of options for overactive bladder medications on the market.  If necessary, contact your insurance company to determine which one(s) may be the least expensive.  Contact your provider?s office if you want to switch to a different medication & the name of the preferred medication(s) as recommended by your insurance company.    Overactive Bladder (OAB) Medications    Brand Name Generic Name  Detrol  Tolterodine  Detrol LA Tolterodine ER  Ditropan Oxybutynin  Ditropan XL Oxybutynin ER  Enablex  Darifenacin  Gelnique Oxybutynin topical gel  Myrbetriq Mirabegron  Oxytrol  Oxybutynin transdermal patch  Sanctura Trospium  Sanctura XR Trospium ER  Tolterodine Tolterodine  Toviaz  Fesoterodine  Vesicare Solifenacin    =======================================================================      Urinary Urgency Suppression Instructions    When you experience an urge to void:    First:  Stop & stand still.  Sit down if you can.  Don't move.  You need to stay very still to maintain control.    Second:  Relax.  Take a deep breath, then let it out.  Try to think of something other than the bathroom.  Try to make the urge go away.    Third:  Contract your pelvic muscles 3 to 4 times to keep from leaking.  Finally, when you feel the urge go away, walk normally to the bathroom.    If the urge recurs on the way, stop & repeat.  Practice this at home until you are able to suppress the urge & feel confident.  Do not be discouraged if control is not immediate.    Adapted from [facility] -- Bennie [place].  Treatment program 6-34.  =======================================================================        Kegel Exercises    1.  During urination, attempt to stop your stream.  The muscle used to stop urination is called the external urinary sphincter.    2.  After you have correctly identified this muscle, you should follow a sphincter strengthening program outside of urination.    Follow this Schedule: -- Slowly contract your sphincter muscle to a count of five.  Slowly release to a count of five.  Repeat 10 times. This is called a set.   -- It is important not to tense other muscles during this exercise.  You're your abdominal (belly), thigh, and butt muscles relaxed while you are doing Kegel exercises.    -- Repeat a set of exercises at least four times daily or up to once every hour.  To help you remember a time schedule, do a set at breakfast, lunch, dinner and bedtime.  Vary the position in which you do the exercises such as standing, lying, walking, and driving.  Different positions will strengthen the muscles in different ways.    -- To help prevent leakage with increased abdominal pressure, you may do a single Kegel squeeze and count of five when you are bending, stooping, getting up out of a chair, or bed or when lifting.    -- It is normal to experience leakage for several weeks or even several months after surgery.  Time, patience, and Kegel exercises will help restore your urinary control.    -- Guards for Men [employer]) is a pad that fits into your jockey shorts to absorb small to moderate amounts of urine.  It has an adhesive strip which adheres to the inside of your underwear.  You may purchase these pads at any major pharmacy or discount store.  For large amounts of leakage, you may wish to purchase Depends underwear.  It's a full brief that you use in place of underwear.  ====================================================================================        Constipation Relief Recipe    1 cup unsweetened applesauce  1 cup unprocessed wheat bran (a.k.a Miller?s bran)  1/4 cup prune juice    Mix all of the ingredients together.  Take 2 tablespoons a day for one week and drink with a full glass of water.  If bowel movements are not greatly improved, increase to 4 tablespoons a day for one week and continue to increase as needed.  The maximum dose is 8 tablespoons a day. Each time you take the recipe, you need to drink an 8 ounce glass of water with it.    The mixture will keep for about one week in a plastic container in the refrigerator.

## 2018-11-01 ENCOUNTER — Encounter: Admit: 2018-11-01 | Discharge: 2018-11-01 | Payer: MEDICARE

## 2018-11-01 ENCOUNTER — Ambulatory Visit: Admit: 2018-11-01 | Discharge: 2018-11-02 | Payer: MEDICARE

## 2018-11-01 MED ORDER — PREDNISONE 5 MG PO TAB
ORAL_TABLET | Freq: Every day | 0 refills | Status: DC
Start: 2018-11-01 — End: 2019-03-12

## 2018-11-01 MED ORDER — METOPROLOL SUCCINATE 50 MG PO TB24
50 mg | ORAL_TABLET | Freq: Every day | ORAL | 3 refills | 90.00000 days | Status: DC
Start: 2018-11-01 — End: 2019-02-28

## 2018-11-01 MED ORDER — FLU VACC QS2020-21(65YR UP)-PF 240 MCG/0.7 ML IM SYRG
.7 mL | Freq: Once | INTRAMUSCULAR | 0 refills | Status: CP
Start: 2018-11-01 — End: ?
  Administered 2018-11-01: 20:00:00 0.7 mL via INTRAMUSCULAR

## 2018-11-01 NOTE — Patient Instructions
1.  Start metoprolol XL 25 mg/day.  2.  We'll call in about a week to check on symptoms and home BP

## 2018-11-01 NOTE — Assessment & Plan Note
Starting metoprolol XL 25 mg/day today.  We may need to use something like propafenone if the beta-blocker alone doesn't suppress her SVT.  We'll call her in a week to check on symptoms--may need to increase dose to 50 mg/day.  She'll check BP at home to be sure the metoprolol doesn't lower BP excessively.

## 2018-11-01 NOTE — Assessment & Plan Note
68 year old female with a years long history of urinary frequency and urgency, that over the last year has developed urgency incontinence.  She urinates as frequently as every 30 minutes to 1 hour, with 1 pair depends per day for leaking.  There were no signs of pelvic organ prolapse on exam, she emptied well with a PVR of 20 mL, and her UA was normal today.  We discussed that her symptoms are consistent with overactive bladder, described treatment options for this, and ultimately recommended a multifaceted approach including behavioral and dietary modifications, pelvic physical for therapy, and a trial of anticholinergic medication.  After discussion, and all questions were answered, the patient agreed that she would like to continue with the aforementioned plan.   Prescribed trospium XR 60 mg   PF PT  Behavioral/dietary modifications: Avoid caffeinated beverages, carbonation  Return to clinic in 3 months

## 2018-11-01 NOTE — Progress Notes
Kristen Norris  is a 68 year old- Caucasian female here for a follow-up visit    The patient is referred by Dr. Olene Floss    Chief complaint: Management of seronegative Sjogren's syndrome      Today    She was last seen in July at which time we recommended hydroxychloroquine and she got the clearance from her ophthalmologist as well as her cardiologist monitoring her for prolonged QT with serial EKGs, I have reviewed the cardiology note from today, showing that her QT was normal, she was started on metoprolol for management of SVT found on Holter monitor    Following her last visit, she requested that we start therapy which we did in August she now reports the prednisone resulted in significant improvement of her joint pain, but some of the symptoms recurred after she stopped, no side effects has been noted with prednisone or hydroxychloroquine, she reports aching moderately severe pain involving her hands, she also notes some poison ivy rash on the right forearm which has been pruritic for the last 1 week, no clear exposure is reported  No other complaints    No other complaints    Complete ROS was performed and is otherwise negative except for joint pain, imbalance and dryness as outlined above    No change in the past medical, surgical, and family history as reviewed in the records     Current medication list:  Reviewed      Physical Examination:    VITAL SIGNS: BP (!) 146/79 (BP Source: Arm, Right Upper, Patient Position: Sitting)  - Pulse 73  - Temp 36.7 ?C (98.1 ?F) (Oral)  - Resp 16  - Ht 157.5 cm (62.01)  - Wt 66.3 kg (146 lb 3.2 oz)  - SpO2 98%  - BMI 26.73 kg/m?      GENERAL APPEARANCE: The patient is a well-nourished adult in no acute distress    EYES: No scleral erythema or conjunctival injection.    ENT: No oral ulcers or parotid enlargement.    NECK: No masses or thyroid enlargement.    LYMPHATIC: No cervical, supraclavicular lymphadenopathy    CARDIOVASCULAR: Heart rhythm is regular. No murmurs CHEST: Normal vesicular breath sounds. No wheezing or crackles    EXTREMITIES: There is no evidence of cyanosis, or edema.    SKIN: No palpable purpura, digital ulcers, abnormal thickening  She had mild well-demarcated erythema on the right ulnar forearm area with some scaling    MUSCULOSKELETAL: The joints of the upper and lower extremities have full range of motion, no tenderness, swelling, deformity except  Previous synovitis of the MCPs not appreciated today,    Schirmer's test  OS 3 mm  OD 4 mm     Unstimulated salivary flow rate= 0.16 ml/min                                   Assessment and plan:    1.  Primary Sjogren's syndrome, she fulfills 2016 classification criteria based on the presence of sicca syndrome, possibly related myalgia and arthralgia, objective evidence of dry eyes and dry mouth, positive ANA and rheumatoid factor and a positive lip biopsy with focus score of 2    She started hydroxychloroquine in August 2020 and I told her it is a little early to judge if it is effective or not to manage inflammatory arthritis, in the short-term we will repeat steroid taper for 3 weeks starting  15 mg daily of prednisone, I told her this may be also helpful in managing her rash that she perceives as poison ivy, explaining that Sjogren's came also give rashes that are pruritic    She will remain on the same dose of hydroxychloroquine and will see her back in 3 months    She is being monitored by her cardiologist for QT prolongation and her recent EKG performed this week showed normal interval    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 Long term plaquenil use since mid 2020, cleared to start therapy by her ophthalmologist     I will see her back in abou 3 months    Leverne Humbles, MD    Because this dictation was prepared with voice recognition software, there remains a potential for typographical errors or incorrect word choices by the system. We apologize for any inadvertent inconvenience from such an error.

## 2018-11-01 NOTE — Patient Instructions
Start prednisone for rash and joint pain    Continue plaquenil    Return in 3 months

## 2018-11-01 NOTE — Progress Notes
Date of Service: 11/01/2018    Kristen Norris is a 68 y.o. female.       HPI     Kristen Norris was in the [facility] clinic today for follow-up regarding paroxysmal supraventricular tachycardia associated with near syncope.  She wore a monitor for a couple of weeks that confirmed the presence of fast SVT.  She has continued to have brief episodes of lightheadedness.    She has not had any chest discomfort nor has she had any episodes of complete syncope.  She denies any breathlessness or problems with peripheral edema.         Vitals:    11/01/18 0842   BP: 138/78   BP Source: Arm, Left Upper   Pulse: 76   Temp: 37 ?C (98.6 ?F)   SpO2: 98%   Weight: 66.9 kg (147 lb 6.4 oz)   Height: 1.575 m (5' 2)   PainSc: Zero     Body mass index is 26.96 kg/m?Marland Kitchen     Past Medical History  Patient Active Problem List    Diagnosis Date Noted   ? 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  2-week cardiac monitor shows episodes of fast SVT 3-20 seconds in duration.     ? Overactive bladder 10/31/2018   ? 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 Rheumatoid factor positive 02/23/2018   ? Arthralgia of right temporomandibular joint 02/23/2018   ? S/P hysterectomy with oophorectomy 07/15/2011   ? Pelvic pain in female 07/15/2011   ? Cystocele 07/15/2011   ? Left bundle branch block 03/17/2011   ? Preoperative evaluation to rule out surgical contraindication 03/17/2011   ? Abnormal electro-oculogram 03/17/2011   ? Ovarian cyst, left 01/05/2011   ? 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 Thoracic or lumbosacral neuritis or radiculitis, unspecified 04/18/2010   ? S/P Lumpectomy of Breast-Surgical Oncology Notes 12/17/2007     DIAGNOSIS:  Left breast Invasive Ductal Cancer diagnosed 3/07,  STAGE II-A  T2 N0 M0  Markers:  ER 100%, PR 0%, Ki67 15%, HER-2/neu negative.     PROCEDURE: Left lumpectomy with sentinel node biopsy 06/07/2005 HPI: This 68 year old postmenopausal female developed pain in the medial side of the left chest wall in 2/07.  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 She underwent left lumpectomy and sentinel lymph node sampling on 06/07/05. Lumpectomy revealed a 2.1 x 2-cm invasive ductal carcinoma, grade II. No angiolymphatic invasion. There was perineural invasion. 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 Margins were clear. 0/2 sentinel lymph nodes were involved.   Oncotype DX was sent and came back with a high recurrence score of 33, which corresponds to a 10-year rate of distant recurrence of 23% with adjuvant tamoxifen alone. For adjuvant chemotherapy, she received four cycles of dose-dense AC and 10 weeks of taxol by Dr. Osborn Coho.  . Taxol had to be stopped at 10 weeks due to grade 3 peripheral neuropathy. She was placed on adjuvant Arimidex in 10/07.  Radiation therapy was given by Dr. Joycelyn Man in [place], [place] and completed in December 2007.   She was last seen in 12/2007 with c/o left axillary burning.  On exam there was a stable scar retraction at left lumpectomy site in left UIQ. No palpable mass, no nipple or areolar change. Right breast: palpable fullness  with tenderness right UIQ most consistent with prominent pectoral muscle (patient works heavy labor as gardener). Left diagnostic mammogram 11/09 was class 2. Right breast with palpable tender fullness right upper breast/chest wall, benign clinically. Right breast sonogram also negative 11/09.  Follow-up exam 03/2008 assessment:  Right breast pain- negative clinical exam as well as negative Ultrasound. Findings and complaint most consistent with musculoskeletal etiology (patient works in Aeronautical engineer).  She now returns for a 3 month f/u exam to confirm this impression.          ? GERD (gastroesophageal reflux disease) ? Depression    ? Environmental allergies    ? PN (Peripheral Neuropathy)    ? Hypoglycemia    ? Osteoporosis      BMD  T-score (-2.3) spine and (-2.9) femur on Actonel     ? Vitamin D deficiency    ? Back pain 07/18/2007     The patient is currently experiencing pain.      Location: middle back   Intensity (1-10): 6   Description: dull   Duration: constant  Fatigue (0-10): 7     ? Pain in joint, ankle and foot 10/05/2006   ? Malignant neoplasm of other specified sites of female breast 06/15/2006     DIAGNOSIS:  Left breast cancer, 3/07.     STAGE:  II-A;  T2N0M0, ER positive, HER-2/neu negative.     PAST THERAPY:  This 68 year old postmenopausal female developed pain in the medial side of the left chest wall in 2/07.  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 She underwent left lumpectomy and sentinel lymph node sampling on 06/07/05. Lumpectomy revealed a 2.1 x 2-cm invasive ductal carcinoma, grade II. No angiolymphatic invasion. There was perineural invasion. 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 Margins were clear. 0/2 sentinel lymph nodes were involved. Oncotype DX was sent and came back with a high recurrence score of 33, which corresponds to a 10-year rate of distant recurrence of 23% with adjuvant tamoxifen alone. For adjuvant chemotherapy, she received four cycles of dose-dense AC and 10 weeks of taxol. Taxol had to be stopped at 10 weeks due to grade 3 peripheral neuropathy. She was placed on adjuvant Arimidex in 10/07.  Radiation therapy was given in [place], [place] and completed in December 2007.    PRESENT THERAPY:  Adjuvant Arimidex since 11/24/05.         ? Edema 02/28/2006         Review of Systems   Constitution: Negative.   HENT: Negative.    Eyes: Negative.    Cardiovascular: Negative.    Respiratory: Negative.    Endocrine: Negative.    Hematologic/Lymphatic: Negative. Skin: Negative.    Musculoskeletal: Negative.    Gastrointestinal: Negative.    Genitourinary: Negative.    Neurological: Negative.    Psychiatric/Behavioral: Negative.    Allergic/Immunologic: Negative.        Physical Exam    Physical Exam   General Appearance: no distress   Skin: warm, no ulcers or xanthomas   Digits and Nails: no cyanosis or clubbing   Eyes: conjunctivae and lids normal, pupils are equal and round   Teeth/Gums/Palate: dentition unremarkable, no lesions   Lips & Oral Mucosa: no pallor or cyanosis   Neck Veins: normal JVP , neck veins are not distended   Thyroid: no nodules, masses, tenderness or enlargement   Chest Inspection: chest is  normal in appearance   Respiratory Effort: breathing comfortably, no respiratory distress   Auscultation/Percussion: lungs clear to auscultation, no rales or rhonchi, no wheezing   PMI: PMI not enlarged or displaced   Cardiac Rhythm: regular rhythm and normal rate   Cardiac Auscultation: S1, S2 normal, no rub, no gallop   Murmurs: no murmur   Peripheral Circulation: normal peripheral circulation   Carotid Arteries: normal carotid upstroke bilaterally, no bruits   Radial Arteries: normal symmetric radial pulses   Abdominal Aorta: no abdominal aortic bruit   Pedal Pulses: normal symmetric pedal pulses   Lower Extremity Edema: no lower extremity edema   Abdominal Exam: soft, non-tender, no masses, bowel sounds normal   Liver & Spleen: no organomegaly   Gait & Station: walks without assistance   Muscle Strength: normal muscle tone   Orientation: oriented to time, place and person   Affect & Mood: appropriate and sustained affect   Language and Memory: patient responsive and seems to comprehend information   Neurologic Exam: neurological assessment grossly intact   Other: moves all extremities        Problems Addressed Today  Encounter Diagnoses   Name Primary?   ? Left bundle branch block    ? PSVT (paroxysmal supraventricular tachycardia) (HCC) Assessment and Plan       Left bundle branch block  She's on hydroxychloroquine, but QT duration is OK.    No other conduction abnormalities seen on her 2-week monitor.  Her echocardiogram last month looked OK.    PSVT (paroxysmal supraventricular tachycardia) (HCC)  Starting metoprolol XL 25 mg/day today.  We may need to use something like propafenone if the beta-blocker alone doesn't suppress her SVT.  We'll call her in a week to check on symptoms--may need to increase dose to 50 mg/day.  She'll check BP at home to be sure the metoprolol doesn't lower BP excessively.      Current Medications (including today's revisions)  ? amitriptyline (ELAVIL) 25 mg tablet Take 25 mg by mouth at bedtime as needed.   ? aspirin EC 81 mg tablet Take 81 mg by mouth daily. Taking every 3 days   ? benzonatate (TESSALON PERLES) 100 mg capsule Take 100 mg by mouth every 8 hours as needed for Cough.   ? CALCIUM CARBONATE (CALCIUM 500 PO) Take 1 Cap by mouth Daily.   ? Cholecalciferol (Vitamin D3) (VITAMIN D) 1,000 unit cap Take 3 Caps by mouth daily.   ? cyanocobalamin (vitamin B-12) 1,000 mcg/mL kit Inject 1,000 mcg to area(s) as directed every 30 days.   ? cyclobenzaprine (FLEXERIL) 5 mg tablet Take 5 mg by mouth three times daily as needed for Muscle Cramps.   ? fluticasone propionate (FLONASE) 50 mcg/actuation nasal spray, suspension Apply 1 spray to each nostril as directed daily.   ? hydrOXYchloroQUINE (PLAQUENIL) 200 mg tablet Take with food. One tab daily for one week then two tabs once daily   ? ibuprofen (MOTRIN) 600 mg tablet Take 1 Tab by mouth every 6 hours as needed for Pain.   ? metoprolol XL (TOPROL XL) 50 mg extended release tablet Take one tablet by mouth daily.   ? phenazopyrid-cran-vit C-B.coag (AZO URINARY TRACT HEALTH) 95 mg/250 mg-30 mg-15 mg tab Take  by mouth.   ? potassium/magnesium (MAGNESIUM-POTASSIUM PO) Take  by mouth daily.   ? Syringe with Needle (Disp) (BD SAFETYGLIDE SYRINGE) 3 mL 25 x 5/8 syrg Use to inject vitamin B12   ? trospium XR (SANCTURA XR) 60 mg  capsule Take one capsule by mouth daily. Do not cut/ crush/ chew

## 2018-11-04 ENCOUNTER — Encounter: Admit: 2018-11-04 | Discharge: 2018-11-04 | Payer: MEDICARE

## 2018-11-05 MED ORDER — OXYBUTYNIN CHLORIDE 10 MG PO TR24
10 mg | ORAL_TABLET | Freq: Every day | ORAL | 1 refills | 12.00000 days | Status: DC
Start: 2018-11-05 — End: 2019-03-14

## 2018-11-05 MED ORDER — TROSPIUM 60 MG PO CP24
Freq: Every day | 3 refills
Start: 2018-11-05 — End: ?

## 2018-11-05 NOTE — Progress Notes
Received notification from pharmacy that Trospium not covered by insurance.  Oxybutynin 10 mg daily sent to pharmacy.

## 2018-11-06 ENCOUNTER — Encounter

## 2018-11-06 DIAGNOSIS — F329 Major depressive disorder, single episode, unspecified: Secondary | ICD-10-CM

## 2018-11-06 DIAGNOSIS — Z9109 Other allergy status, other than to drugs and biological substances: Secondary | ICD-10-CM

## 2018-11-06 DIAGNOSIS — M1990 Unspecified osteoarthritis, unspecified site: Secondary | ICD-10-CM

## 2018-11-06 DIAGNOSIS — G629 Polyneuropathy, unspecified: Secondary | ICD-10-CM

## 2018-11-06 DIAGNOSIS — M549 Dorsalgia, unspecified: Secondary | ICD-10-CM

## 2018-11-06 DIAGNOSIS — M3500 Sicca syndrome, unspecified: Secondary | ICD-10-CM

## 2018-11-06 DIAGNOSIS — E785 Hyperlipidemia, unspecified: Secondary | ICD-10-CM

## 2018-11-06 DIAGNOSIS — Z1379 Encounter for other screening for genetic and chromosomal anomalies: Secondary | ICD-10-CM

## 2018-11-06 DIAGNOSIS — E162 Hypoglycemia, unspecified: Secondary | ICD-10-CM

## 2018-11-06 DIAGNOSIS — K219 Gastro-esophageal reflux disease without esophagitis: Secondary | ICD-10-CM

## 2018-11-06 DIAGNOSIS — Z853 Personal history of malignant neoplasm of breast: Secondary | ICD-10-CM

## 2018-11-06 DIAGNOSIS — C50212 Malignant neoplasm of upper-inner quadrant of left female breast: Secondary | ICD-10-CM

## 2018-11-06 DIAGNOSIS — E559 Vitamin D deficiency, unspecified: Secondary | ICD-10-CM

## 2018-11-06 DIAGNOSIS — M810 Age-related osteoporosis without current pathological fracture: Secondary | ICD-10-CM

## 2018-11-06 NOTE — Telephone Encounter
-----   Message from Katina Degree, RN sent at 11/02/2018 12:57 PM CDT -----  Regarding: call patient  Patient seen 11/01/18 by Dr. Ricard Dillon in [place]. Patient started on Metoprolol for light headed and episodes of near syncope. Ask if she is feeling better and how her blood pressure and pulse has been.

## 2018-11-06 NOTE — Telephone Encounter
-----   Message from Katina Degree, RN sent at 11/02/2018 12:57 PM CDT -----  Regarding: call patient  Patient seen 11/01/18 by Dr. Ricard Dillon in [place]. Patient started on Metoprolol for light headed and episodes of near syncope. Ask if she is feeling better and how her blood pressure and pulse has been.

## 2018-11-06 NOTE — Telephone Encounter
Patient forgot she was supposed to be monitoring bp and pulse.  She states she believes episodes are reduced but she still has them.

## 2018-11-09 ENCOUNTER — Ambulatory Visit: Admit: 2018-11-09 | Discharge: 2018-11-10 | Payer: MEDICARE

## 2018-11-09 ENCOUNTER — Encounter: Admit: 2018-11-09 | Discharge: 2018-11-09 | Payer: MEDICARE

## 2018-11-09 DIAGNOSIS — G629 Polyneuropathy, unspecified: Principal | ICD-10-CM

## 2018-11-09 MED ORDER — CYCLOBENZAPRINE 5 MG PO TAB
5 mg | ORAL_TABLET | Freq: Three times a day (TID) | ORAL | 3 refills | 30.00000 days | Status: AC | PRN
Start: 2018-11-09 — End: ?

## 2018-11-09 NOTE — Progress Notes
This is a 68 year old female who has lip biopsy positive SS and was complaining of autonomic symptoms. She is on hydroxychloroquine along with steroids which has helped her joint pain. Her EMG-NCS was normal and is under the care of urologist as well as cardiologist for her symptoms.     The patient also mentioned about episodes of headache in the back of her head which could represent "coat hanger pain" of low blood pressure.     She will be seen in the neurology clinic as needed.       I spent 10 minutes with this patient. More than 50% of the time was spent in counseling and coordination of care.

## 2018-11-13 ENCOUNTER — Encounter: Admit: 2018-11-13 | Discharge: 2018-11-13 | Payer: MEDICARE

## 2018-11-13 NOTE — Telephone Encounter
11/13/2018 10:45 AM   Spoke with patient to check on symptoms and blood pressure readings. She states she has been feeling well and has had just a few episodes of lightheadedness. Her readings are listed below:  11/08/18 110/70 HR 72, 102/61 HR 61, 115/71 HR 83  11/09/18 108/66 HR 53, 145/88 HR 73(at neurology appointment)  11/11/18 108/76 HR 60  11/12/18 146/94 HR 54, 119/72 HR 69  11/13/18 135/81 HR 57, 112/71 HR 56, 131/81 HR 87(felt shakey)    She will continue to monitor symptoms and vital signs. She will call the office for heart rates consistently under 55 or over 100 and blood pressures over 140/85. She will follow up with Dr Ricard Dillon in 6 months as ordered.  Wilder Glade, LPN

## 2018-11-23 ENCOUNTER — Encounter: Admit: 2018-11-23 | Discharge: 2018-11-23 | Payer: MEDICARE

## 2018-11-23 MED ORDER — HYDROXYCHLOROQUINE 200 MG PO TAB
200 mg | ORAL_TABLET | Freq: Two times a day (BID) | ORAL | 0 refills | 90.00000 days | Status: DC
Start: 2018-11-23 — End: 2019-02-26

## 2019-02-12 ENCOUNTER — Encounter: Admit: 2019-02-12 | Discharge: 2019-02-12 | Payer: MEDICARE

## 2019-02-12 DIAGNOSIS — U071 COVID-19 virus infection: Secondary | ICD-10-CM

## 2019-02-12 DIAGNOSIS — Z853 Personal history of malignant neoplasm of breast: Secondary | ICD-10-CM

## 2019-02-12 DIAGNOSIS — K219 Gastro-esophageal reflux disease without esophagitis: Secondary | ICD-10-CM

## 2019-02-12 DIAGNOSIS — M3500 Sicca syndrome, unspecified: Secondary | ICD-10-CM

## 2019-02-12 DIAGNOSIS — E162 Hypoglycemia, unspecified: Secondary | ICD-10-CM

## 2019-02-12 DIAGNOSIS — G629 Polyneuropathy, unspecified: Secondary | ICD-10-CM

## 2019-02-12 DIAGNOSIS — F329 Major depressive disorder, single episode, unspecified: Secondary | ICD-10-CM

## 2019-02-12 DIAGNOSIS — M549 Dorsalgia, unspecified: Secondary | ICD-10-CM

## 2019-02-12 DIAGNOSIS — Z79899 Other long term (current) drug therapy: Secondary | ICD-10-CM

## 2019-02-12 DIAGNOSIS — Z9109 Other allergy status, other than to drugs and biological substances: Secondary | ICD-10-CM

## 2019-02-12 DIAGNOSIS — E785 Hyperlipidemia, unspecified: Secondary | ICD-10-CM

## 2019-02-12 DIAGNOSIS — M810 Age-related osteoporosis without current pathological fracture: Secondary | ICD-10-CM

## 2019-02-12 DIAGNOSIS — Z1379 Encounter for other screening for genetic and chromosomal anomalies: Secondary | ICD-10-CM

## 2019-02-12 DIAGNOSIS — E559 Vitamin D deficiency, unspecified: Secondary | ICD-10-CM

## 2019-02-12 DIAGNOSIS — M1990 Unspecified osteoarthritis, unspecified site: Secondary | ICD-10-CM

## 2019-02-12 DIAGNOSIS — C50212 Malignant neoplasm of upper-inner quadrant of left female breast: Secondary | ICD-10-CM

## 2019-02-12 DIAGNOSIS — Z8616 History of COVID-19: Secondary | ICD-10-CM

## 2019-02-12 NOTE — Progress Notes
Kristen Norris is a 69 y.o. female.    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     TREATMENT SUMMARY:   This 69 year old postmenopausal female developed pain in the medial side of the left chest wall in 2/07. 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 She underwent left lumpectomy and sentinel lymph node sampling on 06/07/05. Lumpectomy revealed a 2.1 x 2-cm invasive ductal carcinoma, grade II. No angiolymphatic invasion. There was perineural invasion. 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 Margins were clear. 0/2 sentinel lymph nodes were involved. Oncotype DX was sent and came back with a high recurrence score of 33, which corresponds to a 10-year rate of distant recurrence of 23% with adjuvant tamoxifen alone. For adjuvant chemotherapy, she received four cycles of dose-dense AC and 10 weeks of taxol. Taxol had to be stopped at 10 weeks due to grade 3 peripheral neuropathy. She was placed on adjuvant Arimidex in 10/07. Radiation therapy was given in [place], [place] and completed in December 2007.  Adjuvant Femara since 11/24/05 - 11/2010.        Extensive family history of breast and ovarian cancer but genetic testing @ diagnosis not performed due to financial reasons; insurance would not cover.      Family History   Problem Relation Age of Onset   ? Heart Disease Mother    ? Hypoglycemia Mother    ? Heart Disease Father         farmer   ? Coronary Artery Disease Father    ? Heart Attack Brother 45   ? Complete Hysterectomy Sister         rotator cuff surgery   ? Diabetes Type II Maternal Grandmother    ? Cancer-Hematologic Daughter 40   ? COPD Daughter         on oxygen   ? Tobacco disorder Daughter         on Chantix   ? Complete Hysterectomy Daughter 48   ? None Reported Grandchild ? Other Grandchild         wrestler-stomach problems   ? None Reported Grandchild    ? None Reported Grandchild    ? None Reported Grandchild    ? None Reported Grandchild    ? None Reported Sister    ? Heart murmur Sister    ? Heart Disease Sister    ? Osteopenia Sister    ? Depression Brother 56        hung himself 05/2017   ? Other Brother 0        died in utero-umbilical cord wrapped around him   ? Cancer-Breast Paternal Aunt 55        bilateral mastectomy   ? Cancer-Breast Other 60        paternal 1st cousin   ? Cancer-Lung Other 72        paternal 1st cousin   ? Cancer-Breast Other 40        paternal 1st cousin   ? Cancer-Ovarian Other 25   ? None Reported Other    ? Cancer-Breast Other 11        paternal 1st cousin   ? Brain Tumor Other 50        nephew   ? Cancer-Lung Paternal Uncle 45   ? Cervical Cancer  Neg Hx    ? Cancer-Colon Neg Hx    ? Cancer-Uterine Neg Hx    ? Bleeding Disorders Neg Hx    ? 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 S/P lumpectomy/SLNBX (0/2). She received adjuvant chemotherapy AC x 4 followed by weekly Taxol x 10 of 12 planned cycles; stopped due to grade 3 peripheral neuropathy. She received WBR in [place] which completed in 01/2006. Kristen Norris has an extensive family history of breast and ovarian cancer but genetic testing not done due to financial reasons. At her visit in 01/2016 she complained of horrible bony pain and bone scan was performed 02/05/2016 which was negative for osseous metastatic disease. She also had an elevated Hgb, Hct and RBC as well as hyperkalemia. Abdominal US was performed 02/05/2016 and showed a milk calcium cyst (6mm) in the left kidney, otherwise normal.  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 She is feeling well at this time; is now established with neurology, rheumatology and cardiology.    Review of Systems  Weight (BMI 26.9; # 152 <- # 26.0; # 146 <- 27.33; # 149 <- 27.18 <- 26.46 <- 25.9 <- 25.54 <- 25.15).   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    Patient reports that she is getting Prolia every 6 months (PCP). This treatment started in 03/2015.   Broke a left rib 2017-02-21  and then caught a bad cold with coughing and broke another left rib 06/2017.  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 Now on Plaquenil and Flexeril.   Referred to (Dr. Chales Norris) neurologist.   Referred to (Dr. Littie Norris) urologist (now on oxybutnin).   Referred to (Dr. Barry Norris) cardiologist (now on metopropolol)  Fell in 12/2017; has had several falls since. No injury per patient. Kristen Norris (younger brother hung himself 2017/05/22; age 31).   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   Patient reports that she developed COVID-19 in 12/28/2018.   Returned to work on 02/11/2019. Feeling better; just has continued fatigue.   Irregular heart rhythm; evaluated by cardiology Kristen Norris); now on metoprolol.   No fever or chills.   No headache.   SOB and cough continues without changes.   No chest pain or palpitations.   No diagnosis of LUE lymphedema.   Left breast soreness occasionally.   No numbness or tingling of the fingers or toes.  No weakness.   No skin rash.   Chronic lower back pain; is planning to see Dr. Rudell Norris in the spine center again.   No nausea, vomiting, diarrhea, constipation or abdominal pain.   Patient states that she had a colonoscopy ([place], [place]); 2018, normal per patient report.   Total laparoscopic hysterectomy bilateral salpingooophorectomy 03/2011 (pathology negative)  Family history of paternal first cousins x 3 with breast CA (50, 27, 12), 1 paternal aunt with breast CA (62) and 1 paternal cousin  with ovarian CA. Patient has 2 daughters.   No hot flashes or vaginal dryness; patient is not sexually active.   No formal exercise.  Former smoker (20 year 1ppd history); quit 1999.     Current Medication List:         ? amitriptyline (ELAVIL) 25 mg tablet Take 25 mg by mouth at bedtime as needed.   ? aspirin EC 81 mg tablet Take 81 mg by mouth daily. Taking every 3 days   ? benzonatate (TESSALON PERLES) 100 mg capsule Take 100 mg by mouth every 8 hours as needed for Cough.   ? CALCIUM CARBONATE (CALCIUM 500 PO) Take 1 Cap by mouth Daily.   ? Cholecalciferol (Vitamin D3) (VITAMIN D) 1,000 unit cap Take 3 Caps by mouth daily.   ? cyclobenzaprine (FLEXERIL) 5 mg tablet Take one tablet by mouth three times daily as needed for Muscle Cramps. ? fluticasone propionate (FLONASE) 50 mcg/actuation nasal spray, suspension Apply 1 spray to each nostril as directed daily.   ? hydrOXYchloroQUINE (PLAQUENIL) 200 mg tablet Take one tablet by mouth twice daily. TAKE 1 TABLET BY MOUTH ONCE DAILY FOR 7 DAYS THEN INCREASE TO 2 TABS ONCE DAILY *TAKE WITH FOOD   ? ibuprofen (MOTRIN) 600 mg tablet Take 1 Tab by mouth every 6 hours as needed for Pain.   ? metoprolol XL (TOPROL XL) 50 mg extended release tablet Take one tablet by mouth daily.   ? oxybutynin XL (DITROPAN XL) 10 mg tablet Take one tablet by mouth daily. Do not cut/ crush/ chew   ? phenazopyrid-cran-vit C-B.coag (AZO URINARY TRACT HEALTH) 95 mg/250 mg-30 mg-15 mg tab Take  by mouth.   ? potassium/magnesium (MAGNESIUM-POTASSIUM PO) Take  by mouth daily.   ? predniSONE (DELTASONE) 5 mg tablet 3 tablets daily for 7 days then 2 tablets daily for 7 days then 1 tablets daily for 7 days then stop   ? Syringe with Needle (Disp) (BD SAFETYGLIDE SYRINGE) 3 mL 25 x 5/8 syrg Use to inject vitamin B12          Objective   Vitals:    02/15/19 1021   BP: 137/75   BP Source: Arm, Right Upper   Patient Position: Sitting   Pulse: 64   Resp: 16   Temp: 36.1 ?C (96.9 ?F)   TempSrc: Skin   SpO2: 100%   Weight: 68.9 kg (152 lb)   Height: 160 cm (63)   PainSc: Zero       Body mass index is 26.93 kg/m?Marland Kitchen     Pain Rating:   0       Pain Addressed:  N/A    Fatigue Scale:6    ECOG performance status is 0, Fully active, able to carry on all pre-disease performance without restriction.Marland Kitchen    Physical Exam   Pulmonary/Chest:       Lymphadenopathy:     She has no cervical adenopathy.     She has no axillary adenopathy.        Right: No supraclavicular adenopathy present.        Left: No supraclavicular adenopathy present.   Vitals reviewed.  This is a 68 year old female in no acute distress, well developed.   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   Neck: No JVD, supple. Chest: Wheezes noted in the bilateral lower lobes (unchanged).    CV:  RRR without murmur.   Abdomen: Soft, non-distended, non-tender,  positive bowel sounds, no organomegaly.   Skin: No rash.   Extremities: No clubbing, cyanosis.   No evidence of LUE lymphedema.   Neuro: CN: II-XII grossly intact; no sensory or motor abnormalities noted.     CBC w/Diff    Lab Results   Component Value Date/Time    WBC 3.7 (L) 02/15/2019 09:10 AM    RBC 5.02 (H) 02/15/2019 09:10 AM    HGB 14.4 02/15/2019 09:10 AM    HCT 44.1 02/15/2019 09:10 AM    MCV 87.8 02/15/2019 09:10 AM    MCH 28.7 02/15/2019 09:10 AM    MCHC 32.7 02/15/2019 09:10 AM    RDW 15.5 (H) 02/15/2019 09:10 AM    PLTCT 251 02/15/2019 09:10 AM    MPV 8.6 02/15/2019 09:10 AM    Lab Results   Component Value Date/Time    NEUT 40 (L) 02/15/2019 09:10 AM    ANC 1.50 (L) 02/15/2019 09:10 AM    LYMA 41 02/15/2019 09:10 AM    ALC 1.50 02/15/2019 09:10 AM    MONA 14 (H) 02/15/2019 09:10 AM    AMC 0.50 02/15/2019 09:10 AM    EOSA 4 02/15/2019 09:10 AM    AEC 0.10 02/15/2019 09:10 AM    BASA 1 02/15/2019 09:10 AM    ABC 0.00 02/15/2019 09:10 AM        Comprehensive Metabolic Profile    Lab Results   Component Value Date/Time    NA 139 02/15/2019 09:10 AM    K 4.1 02/15/2019 09:10 AM    CL 104 02/15/2019 09:10 AM    CO2 30 02/15/2019 09:10 AM    GAP 5 02/15/2019 09:10 AM    BUN 16 02/15/2019 09:10 AM    CR 0.80 02/15/2019 09:10 AM    GLU 102 (H) 02/15/2019 09:10 AM    GLU 98 09/23/2005 10:13 AM    Lab Results   Component Value Date/Time    CA 9.5 02/15/2019 09:10 AM    ALBUMIN 4.4 02/15/2019 09:10 AM    TOTPROT 7.2 02/15/2019 09:10 AM    ALKPHOS 74 02/15/2019 09:10 AM    AST 21 02/15/2019 09:10 AM    ALT 20 02/15/2019 09:10 AM    TOTBILI 0.6 02/15/2019 09:10 AM    GFR >60 02/15/2019 09:10 AM    GFRAA >60 02/15/2019 09:10 AM        Lab Results   Component Value Date    CA125 30 08/24/2018         BREAST IMAGING:   Bilateral mammogram 07/23/08: BIRAD 2-BENIGN Left breast US 07/23/08: BIRAD 1-NEGATIVE   Bilateral mammogram 08/05/09: BIRAD 1-NEGATIVE   Bilateral mammogram 12/12011: BIRADS 1  Bilateral breast US 01/2010: Multiple real-time grayscale images were utilized to evaluate the numerous sites of palpable fullness within the right upper breast, right medial breast at the 3:00 position, left breast 12:00 position, left breast 11:00 position and the left periareolar region superiorly. No masses are seen. There is no evidence of malignancy Impression:  ASSESSMENT: BIRAD 1-NEGATIVE.   Bilateral mammograms 02/02/2011: BIRADS 1  Bilateral DMAMM (TOMO) and Left breast ultrasound 02/07/2012:    FINDINGS:  There are scattered fibroglandular densities. 3-D (tomosynthesis) images were performed in addition to 2D ammograms. No masses, densities or calcifications to suggest malignancy. No change when compared to prior studies.    FINDINGS:  Ultrasound was performed to evaluate the area of palpable finding and pain detected by the patient at 12:00 5 cm from the nipple. At  this location no abnormalities are present. Additional imaging was performed at areas of skin marking where referring provider palpated a finding. These are located at 1230 o'clock 4 cm from the nipple 11:00 6 cm from the nipple and 1130 5 cm from the nipple. At the locations no abnormalities are present. Scar from prior lumpectomy is noted at 10:00 7 cm from the nipple.  Imaging of the left axilla is unremarkable.  IMPRESSION:  ASSESSMENT: BIRAD 2-BENIGN.    Bilateral mammogram (TOMO) 02/05/2013:  ASSESSMENT: BIRAD 2-BENIGN  Bilateral mammogram (TOMO) 02/04/2014: Results pending at this dictation  ASSESSMENT: BIRAD 4-Suspicious (Overall)  DIAG MAMMO BL/T: BIRAD 4-suspicious abnormality in both breasts.  [place] TARGET BILAT: BIRAD 1-negative.  RECOMMENDATION:  Stereotactic biopsy of the left breast. ?  Surgical pathology:   Left MTBX 03/10/2014:   Final Diagnosis: A. Breast, left breast 10:00 with calcs, needle core biopsies: Atrophic breast tissue with focal fibrosis and calcifications.   B. Breast, left breast 10:00 no calcs, needle core biopsies: Benign breast tissue with microcalcification within fibrotic stroma.   Bilateral screening mammogram TOMO 02/05/15: ACR BI-RADS? Assessments: BIRAD 1-Negative  02/05/2016 BILATERAL MAMMOGRAM:  ACR BI-RADS? Assessments: BIRAD 1-Negative  02/05/2016 TARGETED RIGHT BREAST ULTRASOUND: ACR BI-RADS? Assessments: BIRAD 1-Negative  02/06/2017 BILATERAL MAMMOGRAM: ACR BI-RADS? Assessments: BIRAD 2-Benign  02/08/2018 BILATERAL MAMMOGRAM (TOMO):  ASSESSMENT: BIRAD 2-Benign  02/15/2019 BILATERAL MAMMOGRAM (TOMO):  ASSESSMENT: BIRAD 2-Benign     BONE HEALTH:     BMD 03/2007 revealed T score of -2.3 in spine and -2.9 in femur (osteoporosis). There was improvement in BMD of both spine and hip  BMD 07/23/08: Osteoporosis, mild improvement in BMD.   BMD 08/05/09: Osteoporosis, minimal increase in BMD.   BMD 10/06/10: Stable osteoporosis  BMD 02/07/12: Persistent osteoporosis; improvement in lumbar spine; stable in femurs.   BMD 02/05/13:  OSTEOPOROSIS. CURRENT MINIMUM T SCORE -2.7 AT THE RIGHT FEMORAL NECK VERSUS PRIOR MINIMUM T SCORE -2.6 AT THE LEFT FEMORAL NECK.   BMD 02/04/2014: Stable findings of osteoporosis places patient at high risk for fracture.   BMD 02/05/2015: Osteoporosis, at high risk for fracture.  BMD 02/05/2016: Persistent findings of Osteoporosis.    IMAGING:   MRI L-SPINE 04/2010:   1. 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  2. THERE IS MILD ANTERIOR SPURRING AT L1-L2 WITH MODERATE ENDPLATE CHANGES IN THE ANTEROSUPERIOR ASPECT OF THE L2 VERTEBRAL BODY AS DESCRIBED. MOST LIKELY THIS REPRESENTS REACTIVE DEGENERATIVE CHANGES. NEOPLASM IS FELT TO BE LESS LIKELY.     BONE SCAN 04/09/2010: NO METASTATIC DISEASE. CXR 02/07/2012:  NO ACUTE DISEASE IN THE CHEST.     CT CHEST 02/09/2012: NO EVIDENCE OF THORACIC METASTATIC DISEASE.     MRI HEAD 02/11/2013: UNREMARKABLE ENHANCED AND UNENHANCED MRI SCAN OF THE HEAD.     ABDOMINAL ULTRASOUND 02/05/2016: Small left inferior pole milk of calcium cyst. Otherwise, normal abdominal ultrasound.    BONE SCAN 02/05/2016: No scintigraphic evidence of disseminated osseous metastatic disease. Focal increased uptake in the left cervical thoracic junction which may reflect degenerative arthropathy.     CT CHEST/ABDOMEN 08/24/2016:   1. No intra-abdominal inflammatory mass, ascites, or bowel obstruction. ?The appendix is normal.  2. Mild distal colonic diverticulosis  3. Small, focal area of tree-in-Kristen Norris type opacities within the right middle lobe that has developed since January 2014. ?This is nonspecific and may represent scarring related to a prior infection or represent infectious bronchiolitis. ?  Patient instructed to F/U with primary care Dutch Gray, PA) in [place], [place] for further evaluation.     BONE HEALTH:   02/08/2018 BMD:   LUMBAR SPINE, L1-L2  Current: 0.969 g/cm2, T-score of -1.7 ? ?   Previous: 1.000 g/cm2, T-score of -1.4    LEFT FEMORAL NECK ?  Current: 0.658 g/cm2, T-score of -2.7 ? ?   Previous: 0.677 g/cm2, T-score of -2.6    LEFT TOTAL HIP ?  Current: 0.731 g/cm2, T-score of -2.2   Previous: 0.757 g/cm2, T-score of -2.0    RIGHT FEMORAL NECK  Current: 0.643 g/cm2, T-score of -2.8 ? ?   Previous: 0.673 g/cm2, T-score of -2.6    RIGHT TOTAL HIP  Current: 0.726 g/cm2, T-score of -2.2   Previous: 0.773 g/cm2, T-score of -1.9 ?    Assessment and Plan: 1. A 69 year old postmenopausal white female with T2 Grade II, ER positive, HER-2/neu negative left breast cancer, S/P adjuvant chemotherapy, dose-dense Adriamycin and Cytoxan and 10 weeks of Taxol. Currently on adjuvant Femara since 10/07 and finished 5 years 11/2010. Menopausal symptoms and depression are stable per patient she stopped Effexor due to cost (about 2 years ago).   2. Increasing neutropenia; may be related to medications, COVID infection and/or history of chemotherapy. Will refer to hematology for evaluation.   3. Osteoporosis; BMD 02/05/2013 (osteoporosis).  Patient continues on boniva (PCP prescribes). Repeat BMD 01/2016; showed continued stable osteoporosis. Patient started Prolia in 03/2015, has only received 2 injections; then no further treatment. BMD 02/08/2018; progression of osteoporosis. Patient will be seeing a rheumatologist (trying to rule out auto-immune disease). I have encouraged her to discuss alternative medications for osteoporosis. States that she cannot afford.  I have forwarded results to rheumatologist and will coordinate an alternative for bone strengthening.   4. Low back pain:MRI Lumbar spine with DJD; negative bone scan in 04/2010; has seen Dr. Welton Flakes in the past with steroid injection in 10/2010.     5. Family history of breast and ovarian cancer: no genetic testing due to cost. 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 Phoned patient with results 03/09/2018 (12:40). In the absence of a definitive mutation, the risk for future cancers and medical management recommendations should be based on personal and family history of cancer.  6. Reviewed signs to watch for that could indicate a local or distant recurrence. Patient will call our office with any new signs.   Local recurrence  Signs and symptoms of local recurrence within the same breast may include:   A new lump in your breast or irregular area of firmness A new thickening in your breast area   A new pulling back of the skin or dimpling at the lumpectomy site   Skin inflammation or area of redness   Flattening or indentation of your nipple or other nipple changes  A regional breast cancer recurrence means the cancer has come back in the lymph nodes in your armpit or collarbone area. Signs and symptoms of regional recurrence may include:   A lump or swelling in the lymph nodes under your arm or in the groove above your collarbone   Swelling of your arm (this could be related to lymphedema or even a blood clot)  Persistent pain in your arm and shoulder   Increasing loss of sensation in your arm and hand  Distant (metastatic) recurrence   A distant, or metastatic, recurrence means the cancer has traveled to distant parts of the body, most  commonly the bones, liver and lungs. The signs and symptoms may include:   Pain, such as chest or bone pain   Persistent, dry cough   Difficulty breathing   Loss of appetite   Persistent nausea, vomiting or weight loss   Swelling in the abdomen  Severe headaches  When to call our office   You know your body best ? what feels normal and what doesn't. It's important to be aware of the signs and symptoms of recurrent breast cancer, such as:   New and persistent pain   Changes or new lumps in your breast  Weight loss   Shortness of breath  If you experience any signs and symptoms that might suggest a recurrence, call our office. Patient verbalized understanding and phone numbers provided.    7. RTC one year with bilateral mammogram/lab and BMD.      I have spent 15 minutes preparing for this visit. I have spent 35 minutes face to face in evaluation, management, counseling and coordination of care functions.   Total time: 50 minutes.     Collaborating physician: Joni Reining, MD  NPI # [id number]         Ernie Hew, APRN

## 2019-02-15 ENCOUNTER — Encounter: Admit: 2019-02-15 | Discharge: 2019-02-15 | Payer: MEDICARE

## 2019-02-15 DIAGNOSIS — E162 Hypoglycemia, unspecified: Secondary | ICD-10-CM

## 2019-02-15 DIAGNOSIS — Z1231 Encounter for screening mammogram for malignant neoplasm of breast: Secondary | ICD-10-CM

## 2019-02-15 DIAGNOSIS — M810 Age-related osteoporosis without current pathological fracture: Secondary | ICD-10-CM

## 2019-02-15 DIAGNOSIS — Z9221 Personal history of antineoplastic chemotherapy: Secondary | ICD-10-CM

## 2019-02-15 DIAGNOSIS — G629 Polyneuropathy, unspecified: Secondary | ICD-10-CM

## 2019-02-15 DIAGNOSIS — E785 Hyperlipidemia, unspecified: Secondary | ICD-10-CM

## 2019-02-15 DIAGNOSIS — U071 COVID-19: Secondary | ICD-10-CM

## 2019-02-15 DIAGNOSIS — K219 Gastro-esophageal reflux disease without esophagitis: Secondary | ICD-10-CM

## 2019-02-15 DIAGNOSIS — Z8041 Family history of malignant neoplasm of ovary: Secondary | ICD-10-CM

## 2019-02-15 DIAGNOSIS — M1990 Unspecified osteoarthritis, unspecified site: Secondary | ICD-10-CM

## 2019-02-15 DIAGNOSIS — M3500 Sicca syndrome, unspecified: Secondary | ICD-10-CM

## 2019-02-15 DIAGNOSIS — Z1379 Encounter for other screening for genetic and chromosomal anomalies: Secondary | ICD-10-CM

## 2019-02-15 DIAGNOSIS — Z1273 Encounter for screening for malignant neoplasm of ovary: Secondary | ICD-10-CM

## 2019-02-15 DIAGNOSIS — Z9189 Other specified personal risk factors, not elsewhere classified: Secondary | ICD-10-CM

## 2019-02-15 DIAGNOSIS — Z79899 Other long term (current) drug therapy: Secondary | ICD-10-CM

## 2019-02-15 DIAGNOSIS — Z9109 Other allergy status, other than to drugs and biological substances: Secondary | ICD-10-CM

## 2019-02-15 DIAGNOSIS — Z8616 History of COVID-19: Secondary | ICD-10-CM

## 2019-02-15 DIAGNOSIS — Z780 Asymptomatic menopausal state: Secondary | ICD-10-CM

## 2019-02-15 DIAGNOSIS — D701 Agranulocytosis secondary to cancer chemotherapy: Secondary | ICD-10-CM

## 2019-02-15 DIAGNOSIS — F329 Major depressive disorder, single episode, unspecified: Secondary | ICD-10-CM

## 2019-02-15 DIAGNOSIS — Z8781 Personal history of (healed) traumatic fracture: Secondary | ICD-10-CM

## 2019-02-15 DIAGNOSIS — C50912 Malignant neoplasm of unspecified site of left female breast: Secondary | ICD-10-CM

## 2019-02-15 DIAGNOSIS — E559 Vitamin D deficiency, unspecified: Secondary | ICD-10-CM

## 2019-02-15 DIAGNOSIS — Z853 Personal history of malignant neoplasm of breast: Secondary | ICD-10-CM

## 2019-02-15 DIAGNOSIS — M549 Dorsalgia, unspecified: Secondary | ICD-10-CM

## 2019-02-15 DIAGNOSIS — C50212 Malignant neoplasm of upper-inner quadrant of left female breast: Secondary | ICD-10-CM

## 2019-02-15 DIAGNOSIS — Z13820 Encounter for screening for osteoporosis: Secondary | ICD-10-CM

## 2019-02-15 LAB — COMPREHENSIVE METABOLIC PANEL
Lab: 0.6 mg/dL — ABNORMAL HIGH (ref 0.3–1.2)
Lab: 139 MMOL/L (ref 137–147)
Lab: 16 mg/dL (ref 7–25)
Lab: 74 U/L (ref 25–110)

## 2019-02-15 LAB — CBC AND DIFF
Lab: 0 K/UL (ref >60)
Lab: 1 % (ref >60)
Lab: 14 % — ABNORMAL HIGH (ref 4–12)
Lab: 251 K/UL (ref 150–400)
Lab: 28 pg (ref 26–34)
Lab: 3.7 10*3/uL — ABNORMAL LOW (ref 4.5–11.0)
Lab: 32 g/dL (ref 32.0–36.0)
Lab: 5 M/UL — ABNORMAL HIGH (ref <35)
Lab: 87 FL (ref 80–100)

## 2019-02-15 NOTE — Patient Instructions
Thank you for coming to see us today.   Please call our office or send a message through MyChart if you have any questions or concerns.                                                                                                                                                           Peony Barner RN, BSN, OCN, CBCN, CN-BN  Clinical Nurse Coordinator for Lori Ranallo, APRN-BC        Breast Cancer Survivorship/High Risk Breast Clinic  Lymphedema Screening & Prevention Program   [phone] (phone)  [phone] (fax)      [facility]   Clinic: Monday, Thursday, Friday   Admin: Tuesday  [address]  [place], [place] [address]  Scheduling: [phone]  Fax: [phone]   [facility]   Clinic: Wednesday  [address]  [place], [place] [address]  Scheduling: [phone] (Tara) or [phone] (Nicki)  Fax: [phone] or [phone]         For up to date information on the COVID-19 virus, visit the CDC website. https://www.cdc.gov/coronavirus   General supportive care during cold and flu season and infection prevention reminders:    o Wash hands often with soap and water for at least 20 seconds   o Cover your mouth and nose   o Social distancing: try to maintain 6 feet between you and other people   o Stay home if sick and symptoms mild or manageable?   If you must be around people wear a mask     If you are having symptoms of a lower respiratory infection (cough, shortness of breath) and/or fever AND either traveled in last 30 days (internationally or to region of exposure) OR known exposure to patient with COVID19:     o Call your primary care provider for questions or health needs.    Tell your doctor about your recent travel and your symptoms     o In a medical emergency, call 911 or go to the nearest emergency room.

## 2019-02-16 LAB — COVID-19 (SARS-COV-2) ANTIBODY TOTAL

## 2019-02-18 ENCOUNTER — Encounter: Admit: 2019-02-18 | Discharge: 2019-02-18 | Payer: MEDICARE

## 2019-02-18 NOTE — Telephone Encounter
Navigation Intake Document    Patient Name:  Kristen Norris  DOB:  05/21/1950  Insurance:   Medicare/BCBS     Appointment Info:    Future Appointments   Date Time Provider [place]   02/21/2019 10:00 AM Dola Factor, MD [facility] IM   02/26/2019  2:00 PM Jonni Sanger, APRN-NP [id number] [place] Exam   03/14/2019  8:30 AM Darden Amber, MD [facility] Urology   02/17/2020 12:00 PM PHLEBOTOMIST IN LAB LEVEL 2 [place] Exam   02/17/2020 12:45 PM SCREENING TOMO ROOM [facility] Radiology   02/17/2020  1:00 PM [facility] Radiology   02/17/2020  2:00 PM Tona Sensing, APRN CCC2 [place] Exam     Diagnosis & Reason for Visit:  Evlauate and treat progressive neutropenia with history of chemotherapy for breast cancer, she stopped Femara in 2012. All records in [facility].     Referring Physician:  Tona Sensing APRN   Contact Name & Number:  Pager: [phone] Ph: [phone]     Location of Films:  [place]     COVID-19 guidelines reviewed with patient, including: visitor and universal masking policies, and a temperature check at the facility entrance upon arrival.

## 2019-02-21 ENCOUNTER — Encounter: Admit: 2019-02-21 | Discharge: 2019-02-21 | Payer: MEDICARE

## 2019-02-21 ENCOUNTER — Ambulatory Visit: Admit: 2019-02-21 | Discharge: 2019-02-22 | Payer: MEDICARE

## 2019-02-21 DIAGNOSIS — G629 Polyneuropathy, unspecified: Secondary | ICD-10-CM

## 2019-02-21 DIAGNOSIS — E162 Hypoglycemia, unspecified: Secondary | ICD-10-CM

## 2019-02-21 DIAGNOSIS — Z1379 Encounter for other screening for genetic and chromosomal anomalies: Secondary | ICD-10-CM

## 2019-02-21 DIAGNOSIS — C50212 Malignant neoplasm of upper-inner quadrant of left female breast: Secondary | ICD-10-CM

## 2019-02-21 DIAGNOSIS — Z9109 Other allergy status, other than to drugs and biological substances: Secondary | ICD-10-CM

## 2019-02-21 DIAGNOSIS — M549 Dorsalgia, unspecified: Secondary | ICD-10-CM

## 2019-02-21 DIAGNOSIS — M810 Age-related osteoporosis without current pathological fracture: Secondary | ICD-10-CM

## 2019-02-21 DIAGNOSIS — Z853 Personal history of malignant neoplasm of breast: Secondary | ICD-10-CM

## 2019-02-21 DIAGNOSIS — M3500 Sicca syndrome, unspecified: Secondary | ICD-10-CM

## 2019-02-21 DIAGNOSIS — F329 Major depressive disorder, single episode, unspecified: Secondary | ICD-10-CM

## 2019-02-21 DIAGNOSIS — E559 Vitamin D deficiency, unspecified: Secondary | ICD-10-CM

## 2019-02-21 DIAGNOSIS — E785 Hyperlipidemia, unspecified: Secondary | ICD-10-CM

## 2019-02-21 DIAGNOSIS — U071 COVID-19: Secondary | ICD-10-CM

## 2019-02-21 DIAGNOSIS — M1990 Unspecified osteoarthritis, unspecified site: Secondary | ICD-10-CM

## 2019-02-21 DIAGNOSIS — K219 Gastro-esophageal reflux disease without esophagitis: Secondary | ICD-10-CM

## 2019-02-21 LAB — CBC AND DIFF
Lab: 1 % (ref >60)
Lab: 1.6 10*3/uL — ABNORMAL LOW (ref 1.8–7.0)
Lab: 15 % — ABNORMAL HIGH (ref 11–15)
Lab: 28 pg (ref 26–34)
Lab: 280 K/UL (ref 150–400)
Lab: 3.7 K/UL — ABNORMAL LOW (ref 4.5–11.0)
Lab: 33 g/dL (ref 32.0–36.0)
Lab: 42 % (ref 24–44)
Lab: 44 % (ref 41–77)
Lab: 47 % — ABNORMAL HIGH (ref 36–45)
Lab: 5.4 M/UL — ABNORMAL HIGH (ref 4.0–5.0)
Lab: 9.3 FL (ref 7–11)

## 2019-02-21 LAB — IMMUNOGLOBULINS-IGA,IGG,IGM
Lab: 156 mg/dL (ref 70–390)
Lab: 829 mg/dL (ref 762–1488)
Lab: 89 mg/dL (ref 38–328)

## 2019-02-21 LAB — ANTI SSA ANTI SSB AB

## 2019-02-21 LAB — KAPPA/LAMBDA FREE LIGHT CHAINS
Lab: 1.6 mg/dL (ref 0.33–1.94)
Lab: 1.7 mg/dL (ref 0.57–2.63)

## 2019-02-21 LAB — COMPREHENSIVE METABOLIC PANEL
Lab: 139 MMOL/L (ref 137–147)
Lab: 4.3 MMOL/L (ref 3.5–5.1)
Lab: 57 mL/min — ABNORMAL LOW (ref >60)
Lab: 8 % (ref 3–12)

## 2019-02-21 LAB — C REACTIVE PROTEIN (CRP): Lab: 0.6 mg/dL — ABNORMAL HIGH (ref <1.0)

## 2019-02-21 LAB — SED RATE: Lab: 8 mm/h (ref 0–30)

## 2019-02-21 LAB — C3 COMPLEMENT 3: Lab: 158 mg/dL (ref 88–200)

## 2019-02-21 LAB — RHEUMATOID FACTOR (RF): Lab: 29 [IU]/mL — ABNORMAL HIGH (ref <25)

## 2019-02-21 LAB — C4 COMPLEMENT 4: Lab: 55 mg/dL — ABNORMAL HIGH (ref 10–49)

## 2019-02-21 MED ORDER — ALENDRONATE 35 MG PO TAB
70 mg | ORAL_TABLET | ORAL | 0 refills | Status: DC
Start: 2019-02-21 — End: 2019-02-21

## 2019-02-21 MED ORDER — ALENDRONATE 70 MG PO TAB
70 mg | ORAL_TABLET | ORAL | 0 refills | Status: DC
Start: 2019-02-21 — End: 2019-05-31

## 2019-02-21 MED ORDER — METHYLPREDNISOLONE 4 MG PO DSPK
ORAL_TABLET | 0 refills | Status: DC
Start: 2019-02-21 — End: 2019-03-12

## 2019-02-21 NOTE — Progress Notes
Kristen Norris  is a 69 year old- Caucasian female here for a follow-up visit    The patient is referred by Dr. Olene Floss    Chief complaint: Management of seronegative Sjogren's syndrome      Today  She was last seen in September at which time we discussed that we would prefer to wait for about 3 months before we assess her response to a given dose of hydroxychloroquine, this was prescribed for management of recurrent rashes, myalgia and arthralgia  She now reports that she was diagnosed with COVID-19 in late November noticing worsening of her joint pain muscle pain and fatigue for several weeks, she went back to work early January and continues to feel unwell, she reports pain involving the base of her thumbs bilaterally more noticeable on the left side, so still swelling and stiffness, she has been compliant with hydroxychloroquine  The level of fatigue is also increased, she also notes pain in her neck and back    She tells me that she was previously diagnosed with osteoporosis and received some pill which she cannot remember now, Fosamax does not sound familiar to her, it seems that she had a DEXA scan showing osteoporosis in early 2020 and she was treated with Prolia by her oncologist, she took 2 doses but could not afford that beyond this, I see note from her oncology service that they want to defer therapy to me,  Again she does not recall being on Fosamax, vitamin D was mildly decreased a few months ago    Complete ROS was performed and is otherwise negative except for joint pain, imbalance and dryness as outlined above    No change in the past medical, surgical, and family history as reviewed in the records     Current medication list:  Reviewed      Physical Examination:    VITAL SIGNS: BP 136/73 (BP Source: Arm, Right Upper, Patient Position: Sitting)  - Pulse 63  - Temp 36.7 ?C (98.1 ?F) (Oral)  - Resp 16  - Ht 160 cm (62.99)  - Wt 69.1 kg (152 lb 6.4 oz)  - SpO2 99%  - BMI 27.00 kg/m? GENERAL APPEARANCE: The patient is a well-nourished adult in no acute distress    EYES: No scleral erythema or conjunctival injection.    ENT: No oral ulcers or parotid enlargement.    NECK: No masses or thyroid enlargement.    LYMPHATIC: No cervical, supraclavicular lymphadenopathy    CARDIOVASCULAR: Heart rhythm is regular. No murmurs    CHEST: Normal vesicular breath sounds. No wheezing or crackles    EXTREMITIES: There is no evidence of cyanosis, or edema.    SKIN: No palpable purpura, digital ulcers, abnormal thickening  She had mild well-demarcated erythema on the right ulnar forearm area with some scaling    MUSCULOSKELETAL: The joints of the upper and lower extremities have full range of motion, no tenderness, swelling, deformity except  She has tenderness and swelling over the first MCP bilaterally with some swelling, no other synovitis noted,    Schirmer's test  OS 3 mm  OD 4 mm     Unstimulated salivary flow rate= 0.16 ml/min                                   Assessment and plan:    1.  Primary Sjogren's syndrome, she fulfills 2016 classification criteria based on the presence of sicca syndrome,  possibly related myalgia and arthralgia, objective evidence of dry eyes and dry mouth, positive ANA and rheumatoid factor and a positive lip biopsy with focus score of 2    She started hydroxychloroquine in August 2020 and it seems that this provided her with some relief of her musculoskeletal pain fatigue however, she acquired COVID-19 infection in late November 2020 and has not felt well since then although she made reasonable recovery 1 month later, she is back to work since January, she is exhibiting some inflammatory arthritis symptoms, we will put her on Medrol Dosepak and asked her to continue hydroxychloroquine, we explored enrolling her in a clinical trial to try to help her symptoms better, and she seems to be open to that, With regard to management of osteoporosis, it seems she took some oral medication in the past which she cannot remember now, but she has been on Prolia and previously received 2 doses but the insurance company would not cover she cannot afford it, her care was transferred to me by her oncology service and I told her I would put her back on Fosamax given the normal vitamin D level, side effects were extensively discussed and instruction as to how to take the medication was discussed with the patient as well, will start 70 mg daily      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 Long term plaquenil use since mid 2020, cleared to start therapy by her ophthalmologist     4. Breast cancer diagnosis in 2007, in remission since     5. Osteoporosis: she was on prolia but could not afford it after 2 doses , never on fosamax which we will start today as above      I will see her back in abou 3 months    Leverne Humbles, MD    Because this dictation was prepared with voice recognition software, there remains a potential for typographical errors or incorrect word choices by the system. We apologize for any inadvertent inconvenience from such an error.

## 2019-02-22 DIAGNOSIS — M3500 Sicca syndrome, unspecified: Principal | ICD-10-CM

## 2019-02-22 NOTE — Progress Notes
Name: Kristen Norris          MRN: [id number]      DOB: 12-05-1950      AGE: 69 y.o.   DATE OF SERVICE: 02/26/2019    Subjective:             Reason for Visit:  Heme/Onc Care      Kristen Norris is a 69 y.o. female.     Cancer Staging  No matching staging information was found for the patient.    History of Present Illness  Kristen Norris is a 69 year old female referred to [place] by Ernie Hew, APRN with breast oncology for worsening neutropenia (see below). She has a history of breast cancer which she received chemotherapy for in 2007. her medical history is also significant for Sjorgen's Syndrome. She is currently on hydroxychloroquinolone for this. She has been on this since Fall of 2020. She also had COVID 19 in November of 2020. She also had a slightly high H/H on 02/21/19. Denies smoking or sleep apnea symptoms. She also follows with with neurology, cardiology, urology, and rheumatology.  She has been feeling okay lately but does complain of fatigue, weight gain, generalized pain, and urinary frequency and incontinence.        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 Currently on adjuvant Femara since 10/07 and finished 5 years 11/2010.     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 Phoned patient with results 03/09/2018 (12:40). In the absence of a definitive mutation, the risk for future cancers and medical management recommendations should be based on personal and family history of cancer.    Medical History:   Diagnosis Date   ? Arthritis    ? Back pain 07/18/2007   ? Chest pain    ? COVID-19 12/28/2018   ? Depression    ? Environmental allergies    ? 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 Hereditary Cancer Update Test - [place]) ? GERD (gastroesophageal reflux disease)    ? Hyperlipemia    ? Hypoglycemia    ? 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 Neuropathy    ? Osteoporosis 2/09    BMD  T-score (-2.3) spine and (-2.9) femur on Actonel   ? Personal history of malignant neoplasm of breast    ? PN (Peripheral Neuropathy)    ? Sjogren's disease (HCC)    ? Vitamin D deficiency 6/09     Surgical History:   Procedure Laterality Date   ? CARDIAC CATHERIZATION  03/28/2005    LAD 20%, Circ ostial 30%, EF 65%   ? HX BREAST LUMPECTOMY  06/07/05    Left with SLNBX (0/2)   ? HX HYSTERECTOMY  03/24/2011    [name], MD TLH/BSO   ? HX BLADDER SUSPENSION     ? HX TUBAL LIGATION       Family History   Problem Relation Age of Onset   ? Heart Disease Mother    ? Hypoglycemia Mother    ? Heart Disease Father         farmer   ? Coronary Artery Disease Father    ? Heart Attack Brother  45   ? Complete Hysterectomy Sister         rotator cuff surgery   ? Diabetes Type II Maternal Grandmother    ? Cancer-Hematologic Daughter 59   ? COPD Daughter         on oxygen   ? Tobacco disorder Daughter         on Chantix   ? Complete Hysterectomy Daughter 38   ? None Reported Grandchild    ? Other Grandchild         wrestler-stomach problems   ? None Reported Grandchild    ? None Reported Grandchild    ? None Reported Grandchild    ? None Reported Grandchild    ? None Reported Sister    ? Heart murmur Sister    ? Heart Disease Sister    ? Osteopenia Sister    ? Depression Brother 56        hung himself 05/2017   ? Other Brother 0        died in utero-umbilical cord wrapped around him   ? Cancer-Breast Paternal Aunt 55        bilateral mastectomy   ? Cancer-Breast Other 60        paternal 1st cousin   ? Cancer-Lung Other 53        paternal 1st cousin   ? Cancer-Breast Other 40        paternal 1st cousin   ? Cancer-Ovarian Other 25   ? None Reported Other ? Cancer-Breast Other 50        paternal 1st cousin   ? Brain Tumor Other 54        nephew   ? Cancer-Lung Paternal Uncle 45   ? Cervical Cancer Neg Hx    ? Cancer-Colon Neg Hx    ? Cancer-Uterine Neg Hx    ? Bleeding Disorders Neg Hx    ? Diabetes Neg Hx      Social History     Socioeconomic History   ? Marital status: Divorced     Spouse name: Not on file   ? Number of children: 2   ? Years of education: Not on file   ? Highest education level: GED or equivalent   Occupational History   ? Occupation: grounds Human resources officer: [name]   Tobacco Use   ? Smoking status: Former Smoker     Packs/day: 1.00     Years: 14.00     Pack years: 14.00     Types: Cigarettes     Quit date: 04/07/2000     Years since quitting: 18.9   ? Smokeless tobacco: Never Used   Substance and Sexual Activity   ? Alcohol use: Not Currently   ? Drug use: No   ? Sexual activity: Never   Other Topics Concern   ? Not on file   Social History Narrative   ? Not on file            Review of Systems   Constitutional: Positive for fatigue and unexpected weight change. Negative for activity change, appetite change and fever.   HENT: Negative for mouth sores, sore throat and trouble swallowing.    Eyes: Negative.    Respiratory: Positive for shortness of breath. Negative for cough and wheezing.    Cardiovascular: Negative for chest pain and leg swelling.   Gastrointestinal: Negative for anal bleeding, blood in stool, constipation, diarrhea, nausea and vomiting.   Endocrine:  Negative.    Genitourinary: Positive for frequency. Negative for dysuria and hematuria.        Incontinence   Musculoskeletal: Positive for arthralgias, back pain and neck pain. Negative for myalgias.   Skin: Negative for color change and rash.   Allergic/Immunologic: Negative.    Neurological: Negative for dizziness, light-headedness and numbness.   Hematological: Negative. Psychiatric/Behavioral: Negative for confusion and sleep disturbance. The patient is not nervous/anxious.          Objective:         ? alendronate (FOSAMAX) 70 mg tablet Take one tablet by mouth every 7 days. Take at least 30 minutes before breakfast with plain water. Do not lie down for 30 minutes.   ? amitriptyline (ELAVIL) 25 mg tablet Take 25 mg by mouth at bedtime as needed.   ? aspirin EC 81 mg tablet Take 81 mg by mouth daily. Taking every 3 days   ? benzonatate (TESSALON PERLES) 100 mg capsule Take 100 mg by mouth every 8 hours as needed for Cough.   ? CALCIUM CARBONATE (CALCIUM 500 PO) Take 1 Cap by mouth Daily.   ? Cholecalciferol (Vitamin D3) (VITAMIN D) 1,000 unit cap Take 3 Caps by mouth daily.   ? cyclobenzaprine (FLEXERIL) 5 mg tablet Take one tablet by mouth three times daily as needed for Muscle Cramps.   ? fluticasone propionate (FLONASE) 50 mcg/actuation nasal spray, suspension Apply 1 spray to each nostril as directed daily.   ? hydrOXYchloroQUINE (PLAQUENIL) 200 mg tablet Take one tablet by mouth twice daily. TAKE 1 TABLET BY MOUTH ONCE DAILY FOR 7 DAYS THEN INCREASE TO 2 TABS ONCE DAILY *TAKE WITH FOOD   ? ibuprofen (MOTRIN) 600 mg tablet Take 1 Tab by mouth every 6 hours as needed for Pain.   ? methylPREDNIsolone (MEDROL DOSEPAK) 4 mg tablet Take medication as directed on package for 6 days. Take with food.   ? metoprolol XL (TOPROL XL) 50 mg extended release tablet Take one tablet by mouth daily.   ? oxybutynin XL (DITROPAN XL) 10 mg tablet Take one tablet by mouth daily. Do not cut/ crush/ chew   ? phenazopyrid-cran-vit C-B.coag (AZO URINARY TRACT HEALTH) 95 mg/250 mg-30 mg-15 mg tab Take  by mouth.   ? potassium/magnesium (MAGNESIUM-POTASSIUM PO) Take  by mouth daily.   ? predniSONE (DELTASONE) 5 mg tablet 3 tablets daily for 7 days then 2 tablets daily for 7 days then 1 tablets daily for 7 days then stop ? Syringe with Needle (Disp) (BD SAFETYGLIDE SYRINGE) 3 mL 25 x 5/8 syrg Use to inject vitamin B12     Vitals:    02/26/19 1339   BP: (!) 156/76   BP Source: Arm, Right Upper   Pulse: 62   Resp: 18   Temp: 37 ?C (98.6 ?F)   TempSrc: Temporal   SpO2: 98%   Weight: 69.3 kg (152 lb 12.8 oz)   Height: 160 cm (62.99)   PainSc: Zero     Body mass index is 27.07 kg/m?Marland Kitchen     Pain Score: Zero       Fatigue Scale: 6    Pain Addressed:  N/A    Patient Evaluated for a Clinical Trial: No treatment clinical trial available for this patient.     [place] Cooperative Oncology Group performance status is 0, Fully active, able to carry on all pre-disease performance without restriction.Marland Kitchen     Physical Exam  Vitals signs reviewed.   Constitutional:  Appearance: She is well-developed.   HENT:      Head: Normocephalic and atraumatic.   Eyes:      Pupils: Pupils are equal, round, and reactive to light.   Neck:      Musculoskeletal: Normal range of motion and neck supple.   Cardiovascular:      Rate and Rhythm: Normal rate and regular rhythm.      Heart sounds: Normal heart sounds.   Pulmonary:      Effort: Pulmonary effort is normal. No respiratory distress.      Breath sounds: Normal breath sounds. No wheezing or rales.   Abdominal:      General: Bowel sounds are normal. There is no distension.      Palpations: Abdomen is soft.      Tenderness: There is no abdominal tenderness.   Musculoskeletal: Normal range of motion.         General: Deformity (Humb on back (buffalo hump from steroid use)) present.   Skin:     General: Skin is warm and dry.      Findings: No erythema or rash.   Neurological:      Mental Status: She is alert and oriented to person, place, and time.   Psychiatric:         Behavior: Behavior normal.          CBC AND DIFF  Order: [id number] Status:  Final result ??Visible to patient:  No (not released) Next appt:  02/26/2019 at 02:00 PM in Oncology (Taaliyah Delpriore Elige Ko, APRN-NP) Dx:  Sjogren's syndrome, with unspecified ...  Component   Ref Range & Units 02/21/19 1055 02/15/19 0910 02/08/18 0944 02/06/17 1110   White Blood Cells   4.5 - 11.0 K/UL 3.7Low   3.7Low   4.3Low   4.4Low     RBC   4.0 - 5.0 M/UL 5.40High   5.02High   5.14High   5.03High     Hemoglobin   12.0 - 15.0 GM/DL 15.6High   14.4  15.1High   14.3    Hematocrit   36 - 45 % 47.2High   44.1  44.9  43.7    MCV   80 - 100 FL 87.5  87.8  87.4  86.9    MCH   26 - 34 PG 28.8  28.7  29.3  28.4    MCHC   32.0 - 36.0 G/DL 09.8  11.9  14.7  82.9    RDW   11 - 15 % 15.4High   15.5High   13.8  13.6    Platelet Count   150 - 400 K/UL 280  251  269  316    MPV   7 - 11 FL 9.3  8.6  8.1  8.2    Neutrophils   41 - 77 % 44  40Low   45  42    Lymphocytes   24 - 44 % 42  41  40  45High     Monocytes   4 - 12 % 10  14High   12  9    Eosinophils   0 - 5 % 3  4  2  3     Basophils   0 - 2 % 1  1  1  1     Absolute Neutrophil Count   1.8 - 7.0 K/UL 1.60Low   1.50Low   1.90  1.80    Absolute Lymph Count   1.0 - 4.8 K/UL 1.55  1.50  1.70  2.00  Absolute Monocyte Count   0 - 0.80 K/UL 0.37  0.50  0.50  0.40    Absolute Eosinophil Count   0 - 0.45 K/UL 0.12  0.10  0.10  0.10    Absolute Basophil Count   0 - 0.20 K/UL 0.04  0.00  0.00  0.00                 Assessment and Plan:  Problem   Other Neutropenia (Hcc)   Rheumatoid Factor Positive   Osteoporosis    BMD  T-score (-2.3) spine and (-2.9) femur on Actonel         Osteoporosis  Continues on Fosamax    Other neutropenia (HCC) Worsening neutropenia for the last few months. History of chemotherapy in 2007. Most likely related to autoimmune disease (Sjorgens) and use of hydroxychloroquinolone. However, will get peripheral smear, folic acid, B12, copper level, and hepatitis c labs today to rule out other causes. With her history of chemotherapy, there is a small increased risk of a secondary malignancy such as MDS. Will proceed with bone marrow biopsy if peripheral smear is abnormal.     Rheumatoid factor positive  Sjorgens syndrome: has been on treatment with hydroxychloroquinolone for this. Multiple specialties have told her that all her symptoms are being caused by this syndrome as it can effect every part of the body.           Plan reviewed with patient and she was allowed ample time to ask questions. She is agreeable and has the phone number for the clinic if she has additional questions or concerns.        ADDENDUM: CBC reviewed today now shows a normal WBC of 10.5 with a mildly high ANC of 7.7. Will proceed with further testing as this could be falsely elevated with current steroid use.

## 2019-02-23 ENCOUNTER — Encounter: Admit: 2019-02-23 | Discharge: 2019-02-23 | Payer: MEDICARE

## 2019-02-26 ENCOUNTER — Encounter: Admit: 2019-02-26 | Discharge: 2019-02-26 | Payer: MEDICARE

## 2019-02-26 DIAGNOSIS — Z9109 Other allergy status, other than to drugs and biological substances: Secondary | ICD-10-CM

## 2019-02-26 DIAGNOSIS — Z1379 Encounter for other screening for genetic and chromosomal anomalies: Secondary | ICD-10-CM

## 2019-02-26 DIAGNOSIS — D701 Agranulocytosis secondary to cancer chemotherapy: Secondary | ICD-10-CM

## 2019-02-26 DIAGNOSIS — M1990 Unspecified osteoarthritis, unspecified site: Secondary | ICD-10-CM

## 2019-02-26 DIAGNOSIS — D709 Neutropenia, unspecified: Secondary | ICD-10-CM

## 2019-02-26 DIAGNOSIS — R768 Other specified abnormal immunological findings in serum: Secondary | ICD-10-CM

## 2019-02-26 DIAGNOSIS — E559 Vitamin D deficiency, unspecified: Secondary | ICD-10-CM

## 2019-02-26 DIAGNOSIS — Z853 Personal history of malignant neoplasm of breast: Secondary | ICD-10-CM

## 2019-02-26 DIAGNOSIS — M3500 Sicca syndrome, unspecified: Secondary | ICD-10-CM

## 2019-02-26 DIAGNOSIS — E162 Hypoglycemia, unspecified: Secondary | ICD-10-CM

## 2019-02-26 DIAGNOSIS — U071 COVID-19: Secondary | ICD-10-CM

## 2019-02-26 DIAGNOSIS — K219 Gastro-esophageal reflux disease without esophagitis: Secondary | ICD-10-CM

## 2019-02-26 DIAGNOSIS — C50212 Malignant neoplasm of upper-inner quadrant of left female breast: Secondary | ICD-10-CM

## 2019-02-26 DIAGNOSIS — F329 Major depressive disorder, single episode, unspecified: Secondary | ICD-10-CM

## 2019-02-26 DIAGNOSIS — M810 Age-related osteoporosis without current pathological fracture: Secondary | ICD-10-CM

## 2019-02-26 DIAGNOSIS — E785 Hyperlipidemia, unspecified: Secondary | ICD-10-CM

## 2019-02-26 DIAGNOSIS — M549 Dorsalgia, unspecified: Secondary | ICD-10-CM

## 2019-02-26 DIAGNOSIS — G629 Polyneuropathy, unspecified: Secondary | ICD-10-CM

## 2019-02-26 DIAGNOSIS — D708 Other neutropenia: Secondary | ICD-10-CM

## 2019-02-26 LAB — CBC AND DIFF
Lab: 0 % (ref 0–5)
Lab: 0 10*3/uL (ref 0–0.45)
Lab: 0.1 10*3/uL (ref 0–0.20)
Lab: 0.8 10*3/uL (ref 0–0.80)
Lab: 1 % (ref 0–2)
Lab: 1.9 10*3/uL (ref 1.0–4.8)
Lab: 10 10*3/uL (ref 4.5–11.0)
Lab: 14 % (ref 11–15)
Lab: 14 g/dL (ref 12.0–15.0)
Lab: 19 % — ABNORMAL LOW (ref 24–44)
Lab: 275 10*3/uL (ref 150–400)
Lab: 28 pg (ref 26–34)
Lab: 32 g/dL (ref 32.0–36.0)
Lab: 45 % — ABNORMAL HIGH (ref 36–45)
Lab: 5.1 M/UL — ABNORMAL HIGH (ref 4.0–5.0)
Lab: 7 % (ref 4–12)
Lab: 7.7 10*3/uL — ABNORMAL HIGH (ref 1.8–7.0)
Lab: 73 % (ref 41–77)
Lab: 8.6 FL (ref 7–11)
Lab: 87 FL (ref 80–100)

## 2019-02-26 MED ORDER — HYDROXYCHLOROQUINE 200 MG PO TAB
ORAL_TABLET | Freq: Every day | ORAL | 0 refills | 90.00000 days | Status: DC
Start: 2019-02-26 — End: 2019-05-31

## 2019-02-26 NOTE — Assessment & Plan Note
Worsening neutropenia for the last few months. History of chemotherapy in 2007. Most likely related to autoimmune disease (Sjorgens) and use of hydroxychloroquinolone. However, will get peripheral smear, folic acid, N23, copper level, and hepatitis c labs today to rule out other causes. With her history of chemotherapy, there is a small increased risk of a secondary malignancy such as MDS. Will proceed with bone marrow biopsy if peripheral smear is abnormal.

## 2019-02-26 NOTE — Assessment & Plan Note
Sjorgens syndrome: has been on treatment with hydroxychloroquinolone for this. Multiple specialties have told her that all her symptoms are being caused by this syndrome as it can effect every part of the body.

## 2019-02-26 NOTE — Assessment & Plan Note
Continues on Fosamax

## 2019-02-27 LAB — HEPATITIS C ANTIBODY W REFLEX HCV PCR QUANT

## 2019-02-27 LAB — FOLATE, SERUM: Lab: >22 ng/mL (ref >3.9)

## 2019-02-27 LAB — VITAMIN B12: Lab: 340 pg/mL (ref 180–914)

## 2019-02-28 ENCOUNTER — Encounter: Admit: 2019-02-28 | Discharge: 2019-02-28 | Payer: MEDICARE

## 2019-02-28 MED ORDER — METOPROLOL SUCCINATE 50 MG PO TB24
ORAL_TABLET | Freq: Every day | ORAL | 3 refills | 90.00000 days | Status: DC
Start: 2019-02-28 — End: 2019-07-03

## 2019-03-01 ENCOUNTER — Encounter: Admit: 2019-03-01 | Discharge: 2019-03-01 | Payer: MEDICARE

## 2019-03-04 ENCOUNTER — Encounter: Admit: 2019-03-04 | Discharge: 2019-03-04 | Payer: MEDICARE

## 2019-03-04 NOTE — Telephone Encounter
Records received from Dr Franken's office. [place].    Staff message sent to Dr Noaiseh for review

## 2019-03-04 NOTE — Telephone Encounter
Pt called requesting her lab results. This RN spoke with patient and read the results of her CBC to her and then her Hep C antibody screen. Patient had no further questions and stated that her f/u appt has already been scheduled.

## 2019-03-12 ENCOUNTER — Encounter: Admit: 2019-03-12 | Discharge: 2019-03-12 | Payer: MEDICARE

## 2019-03-12 DIAGNOSIS — E785 Hyperlipidemia, unspecified: Secondary | ICD-10-CM

## 2019-03-12 DIAGNOSIS — Z853 Personal history of malignant neoplasm of breast: Secondary | ICD-10-CM

## 2019-03-12 DIAGNOSIS — M1990 Unspecified osteoarthritis, unspecified site: Secondary | ICD-10-CM

## 2019-03-12 DIAGNOSIS — E162 Hypoglycemia, unspecified: Secondary | ICD-10-CM

## 2019-03-12 DIAGNOSIS — F329 Major depressive disorder, single episode, unspecified: Secondary | ICD-10-CM

## 2019-03-12 DIAGNOSIS — I471 Supraventricular tachycardia: Secondary | ICD-10-CM

## 2019-03-12 DIAGNOSIS — U071 COVID-19: Secondary | ICD-10-CM

## 2019-03-12 DIAGNOSIS — M810 Age-related osteoporosis without current pathological fracture: Secondary | ICD-10-CM

## 2019-03-12 DIAGNOSIS — Z9109 Other allergy status, other than to drugs and biological substances: Secondary | ICD-10-CM

## 2019-03-12 DIAGNOSIS — E559 Vitamin D deficiency, unspecified: Secondary | ICD-10-CM

## 2019-03-12 DIAGNOSIS — C50212 Malignant neoplasm of upper-inner quadrant of left female breast: Secondary | ICD-10-CM

## 2019-03-12 DIAGNOSIS — M549 Dorsalgia, unspecified: Secondary | ICD-10-CM

## 2019-03-12 DIAGNOSIS — Z1379 Encounter for other screening for genetic and chromosomal anomalies: Secondary | ICD-10-CM

## 2019-03-12 DIAGNOSIS — G629 Polyneuropathy, unspecified: Secondary | ICD-10-CM

## 2019-03-12 DIAGNOSIS — M3500 Sicca syndrome, unspecified: Secondary | ICD-10-CM

## 2019-03-12 DIAGNOSIS — K219 Gastro-esophageal reflux disease without esophagitis: Secondary | ICD-10-CM

## 2019-03-12 NOTE — Progress Notes
Date of Service: 03/12/2019    Kristen Norris Pronounced Kristen Norris is a 69 y.o. female.       HPI     Kristen Norris had COVID diagnosed on November 20 and she was out from work until early January.  She had bad diarrhea, fatigue, cough, and anorexia.    She has seen her neurologist and urologist; there seems to be some agreement that her ongoing fatigue is now related to her Sjogren's.  Her rheumatologist and oncologist both did CBC's showing WBC 3.1.  She was started on corticosteroids for Sjogren's exacerbation and the WBC count came up to the 10's.    Her palpitations were doing better on metoprolol until she started the steroids.      She's been checking BP at home--typically about 116/70.  Her pressure increased with the steroids.         Vitals:    03/12/19 1404   BP: 130/78   BP Source: Arm, Right Upper   Patient Position: Sitting   Pulse: 76   SpO2: 96%   Weight: 68.6 kg (151 lb 3.2 oz)   Height: 1.588 m (5' 2.5)   PainSc: Zero     Body mass index is 27.21 kg/m?Marland Kitchen     Past Medical History  Patient Active Problem List    Diagnosis Date Noted   ? 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  2-week cardiac monitor shows episodes of fast SVT 3-20 seconds in duration.     ? Overactive bladder 10/31/2018   ? 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 Rheumatoid factor positive 02/23/2018   ? Arthralgia of right temporomandibular joint 02/23/2018   ? S/P hysterectomy with oophorectomy 07/15/2011   ? Pelvic pain in female 07/15/2011   ? Cystocele 07/15/2011   ? Left bundle branch block 03/17/2011   ? Preoperative evaluation to rule out surgical contraindication 03/17/2011   ? Abnormal electro-oculogram 03/17/2011   ? Ovarian cyst, left 01/05/2011   ? 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 Thoracic or lumbosacral neuritis or radiculitis, unspecified 04/18/2010 ? S/P Lumpectomy of Breast-Surgical Oncology Notes 12/17/2007     DIAGNOSIS:  Left breast Invasive Ductal Cancer diagnosed 3/07,  STAGE II-A  T2 N0 M0  Markers:  ER 100%, PR 0%, Ki67 15%, HER-2/neu negative.     PROCEDURE: Left lumpectomy with sentinel node biopsy 06/07/2005    HPI: This 69 year old postmenopausal female developed pain in the medial side of the left chest wall in 2/07.  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 She underwent left lumpectomy and sentinel lymph node sampling on 06/07/05. Lumpectomy revealed a 2.1 x 2-cm invasive ductal carcinoma, grade II. No angiolymphatic invasion. There was perineural invasion. 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 Margins were clear. 0/2 sentinel lymph nodes were involved.   Oncotype DX was sent and came back with a high recurrence score of 33, which corresponds to a 10-year rate of distant recurrence of 23% with adjuvant tamoxifen alone. For adjuvant chemotherapy, she received four cycles of dose-dense AC and 10 weeks of taxol by Dr. Osborn Coho.  . Taxol had to be stopped at 10 weeks due to grade 3 peripheral neuropathy. She was placed on adjuvant Arimidex in 10/07.  Radiation therapy was given by Dr. Joycelyn Man in [place], [place]  and completed in December 2007. She was last seen in 12/2007 with c/o left axillary burning.  On exam there was a stable scar retraction at left lumpectomy site in left UIQ. No palpable mass, no nipple or areolar change. Right breast: palpable fullness with tenderness right UIQ most consistent with prominent pectoral muscle (patient works heavy labor as gardener). Left diagnostic mammogram 11/09 was class 2. Right breast with palpable tender fullness right upper breast/chest wall, benign clinically. Right breast sonogram also negative 11/09.  Follow-up exam 03/2008 assessment:  Right breast pain- negative clinical exam as well as negative Ultrasound. Findings and complaint most consistent with musculoskeletal etiology (patient works in Aeronautical engineer).  She now returns for a 3 month f/u exam to confirm this impression.          ? GERD (gastroesophageal reflux disease)    ? Depression    ? Environmental allergies    ? PN (Peripheral Neuropathy)    ? Hypoglycemia    ? Osteoporosis      BMD  T-score (-2.3) spine and (-2.9) femur on Actonel     ? Vitamin D deficiency    ? Back pain 07/18/2007     The patient is currently experiencing pain.      Location: middle back   Intensity (1-10): 6   Description: dull   Duration: constant  Fatigue (0-10): 7     ? Pain in joint, ankle and foot 10/05/2006   ? Malignant neoplasm of other specified sites of female breast 06/15/2006     DIAGNOSIS:  Left breast cancer, 3/07.     STAGE:  II-A;  T2N0M0, ER positive, HER-2/neu negative. PAST THERAPY:  This 69 year old postmenopausal female developed pain in the medial side of the left chest wall in 2/07.  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 She underwent left lumpectomy and sentinel lymph node sampling on 06/07/05. Lumpectomy revealed a 2.1 x 2-cm invasive ductal carcinoma, grade II. No angiolymphatic invasion. There was perineural invasion. 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 Margins were clear. 0/2 sentinel lymph nodes were involved. Oncotype DX was sent and came back with a high recurrence score of 33, which corresponds to a 10-year rate of distant recurrence of 23% with adjuvant tamoxifen alone. For adjuvant chemotherapy, she received four cycles of dose-dense AC and 10 weeks of taxol. Taxol had to be stopped at 10 weeks due to grade 3 peripheral neuropathy. She was placed on adjuvant Arimidex in 10/07.  Radiation therapy was given in [place], [place] and completed in December 2007.    PRESENT THERAPY:  Adjuvant Arimidex since 11/24/05.         ? Edema 02/28/2006         Review of Systems   Constitution: Positive for chills, malaise/fatigue and weight gain.   HENT: Negative.    Eyes: Positive for blurred vision.   Cardiovascular: Positive for dyspnea on exertion, irregular heartbeat and palpitations.   Respiratory: Negative.    Endocrine: Positive for polydipsia.   Hematologic/Lymphatic: Bruises/bleeds easily.   Skin: Negative.         Skin tears     Musculoskeletal: Positive for arthritis, back pain, joint pain, myalgias and neck pain.   Gastrointestinal: Positive for bloating and dysphagia.   Genitourinary: Positive for bladder incontinence.   Neurological: Positive for dizziness, light-headedness, numbness and paresthesias.   Psychiatric/Behavioral: Negative.    Allergic/Immunologic: Negative.  Physical Exam    Physical Exam General Appearance: no distress   Skin: warm, no ulcers or xanthomas   Digits and Nails: no cyanosis or clubbing   Eyes: conjunctivae and lids normal, pupils are equal and round   Teeth/Gums/Palate: dentition unremarkable, no lesions   Lips & Oral Mucosa: no pallor or cyanosis   Neck Veins: normal JVP , neck veins are not distended   Thyroid: no nodules, masses, tenderness or enlargement   Chest Inspection: chest is normal in appearance   Respiratory Effort: breathing comfortably, no respiratory distress   Auscultation/Percussion: lungs clear to auscultation, no rales or rhonchi, no wheezing   PMI: PMI not enlarged or displaced   Cardiac Rhythm: regular rhythm and normal rate   Cardiac Auscultation: S1, S2 normal, no rub, no gallop   Murmurs: no murmur   Peripheral Circulation: normal peripheral circulation   Carotid Arteries: normal carotid upstroke bilaterally, no bruits   Radial Arteries: normal symmetric radial pulses   Abdominal Aorta: no abdominal aortic bruit   Pedal Pulses: normal symmetric pedal pulses   Lower Extremity Edema: no lower extremity edema   Abdominal Exam: soft, non-tender, no masses, bowel sounds normal   Liver & Spleen: no organomegaly   Gait & Station: walks without assistance   Muscle Strength: normal muscle tone   Orientation: oriented to time, place and person   Affect & Mood: appropriate and sustained affect   Language and Memory: patient responsive and seems to comprehend information   Neurologic Exam: neurological assessment grossly intact   Other: moves all extremities      Problems Addressed Today  Encounter Diagnoses   Name Primary?   ? PSVT (paroxysmal supraventricular tachycardia) (HCC)        Assessment and Plan       PSVT (paroxysmal supraventricular tachycardia) (HCC)  Palpitations improved with metoprolol, somewhat worse after corticosteroids started.  Will continue current dose of metoprolol and I'll plan to see her back in about 3 months. Current Medications (including today's revisions)  ? alendronate (FOSAMAX) 70 mg tablet Take one tablet by mouth every 7 days. Take at least 30 minutes before breakfast with plain water. Do not lie down for 30 minutes.   ? amitriptyline (ELAVIL) 25 mg tablet Take 25 mg by mouth at bedtime as needed.   ? aspirin EC 81 mg tablet Take 81 mg by mouth daily. Taking every 3 days   ? benzonatate (TESSALON PERLES) 100 mg capsule Take 100 mg by mouth every 8 hours as needed for Cough.   ? CALCIUM CARBONATE (CALCIUM 500 PO) Take 1 Cap by mouth Daily.   ? Cholecalciferol (Vitamin D3) (VITAMIN D) 1,000 unit cap Take 3 Caps by mouth daily.   ? cyclobenzaprine (FLEXERIL) 5 mg tablet Take one tablet by mouth three times daily as needed for Muscle Cramps.   ? fluticasone propionate (FLONASE) 50 mcg/actuation nasal spray, suspension Apply 1 spray to each nostril as directed as Needed.   ? hydrOXYchloroQUINE (PLAQUENIL) 200 mg tablet TAKE 1 TABLET BY MOUTH ONCE DAILY FOR 7 DAYS THEN INCREASE TO 2 TABLETS ONCE DAILY WITH FOOD   ? ibuprofen (MOTRIN) 600 mg tablet Take 1 Tab by mouth every 6 hours as needed for Pain.   ? metoprolol XL (TOPROL XL) 50 mg extended release tablet Take 1 tablet by mouth once daily   ? oxybutynin XL (DITROPAN XL) 10 mg tablet Take one tablet by mouth daily. Do not cut/ crush/ chew   ? potassium/magnesium (MAGNESIUM-POTASSIUM PO)  Take  by mouth daily.     Total time spent on today's office visit was 25 minutes.  This includes face-to-face in person visit with patient as well as nonface-to-face time including review of the EMR, outside records, labs, radiologic studies, echocardiogram & other cardiovascular studies, formation of treatment plan, after visit summary, future disposition, and lastly on documentation.

## 2019-03-12 NOTE — Assessment & Plan Note
Palpitations improved with metoprolol, somewhat worse after corticosteroids started.  Will continue current dose of metoprolol and I'll plan to see her back in about 3 months.

## 2019-03-14 ENCOUNTER — Ambulatory Visit: Admit: 2019-03-14 | Discharge: 2019-03-15 | Payer: MEDICARE

## 2019-03-14 ENCOUNTER — Encounter: Admit: 2019-03-14 | Discharge: 2019-03-14 | Payer: MEDICARE

## 2019-03-14 DIAGNOSIS — G629 Polyneuropathy, unspecified: Secondary | ICD-10-CM

## 2019-03-14 DIAGNOSIS — U071 COVID-19: Secondary | ICD-10-CM

## 2019-03-14 DIAGNOSIS — K219 Gastro-esophageal reflux disease without esophagitis: Secondary | ICD-10-CM

## 2019-03-14 DIAGNOSIS — R3915 Urgency of urination: Secondary | ICD-10-CM

## 2019-03-14 DIAGNOSIS — M1990 Unspecified osteoarthritis, unspecified site: Secondary | ICD-10-CM

## 2019-03-14 DIAGNOSIS — M549 Dorsalgia, unspecified: Secondary | ICD-10-CM

## 2019-03-14 DIAGNOSIS — Z1379 Encounter for other screening for genetic and chromosomal anomalies: Secondary | ICD-10-CM

## 2019-03-14 DIAGNOSIS — C50212 Malignant neoplasm of upper-inner quadrant of left female breast: Secondary | ICD-10-CM

## 2019-03-14 DIAGNOSIS — Z9109 Other allergy status, other than to drugs and biological substances: Secondary | ICD-10-CM

## 2019-03-14 DIAGNOSIS — E559 Vitamin D deficiency, unspecified: Secondary | ICD-10-CM

## 2019-03-14 DIAGNOSIS — E785 Hyperlipidemia, unspecified: Secondary | ICD-10-CM

## 2019-03-14 DIAGNOSIS — M3500 Sicca syndrome, unspecified: Secondary | ICD-10-CM

## 2019-03-14 DIAGNOSIS — F329 Major depressive disorder, single episode, unspecified: Secondary | ICD-10-CM

## 2019-03-14 DIAGNOSIS — M810 Age-related osteoporosis without current pathological fracture: Secondary | ICD-10-CM

## 2019-03-14 DIAGNOSIS — N3281 Overactive bladder: Secondary | ICD-10-CM

## 2019-03-14 DIAGNOSIS — Z853 Personal history of malignant neoplasm of breast: Secondary | ICD-10-CM

## 2019-03-14 DIAGNOSIS — E162 Hypoglycemia, unspecified: Secondary | ICD-10-CM

## 2019-03-14 MED ORDER — OXYBUTYNIN CHLORIDE 10 MG PO TR24
10 mg | ORAL_TABLET | Freq: Every day | ORAL | 3 refills | 12.00000 days | Status: AC
Start: 2019-03-14 — End: ?

## 2019-03-14 NOTE — Progress Notes
Date of Service: 03/14/2019    Subjective:             Kristen Norris Pronounced Kristen Norris is a 69 y.o. female.    History of Present Illness  Ms. Stump presents to clinic for followup.  She has a history of urinary urgency, frequency, and nocturia.  She has underlying history of breast cancer and Sjogren's syndrome.  She was last seen on 10-31-2018 and that was her first appointment with [place].  She was given a prescription for trospium but this was not covered by her insurance and it was switched to oxybutynin.  She is taking 10 mg extended release once daily.  She reports doing well with this.  She does have some baseline dry mouth because of her Sjogren's syndrome but this is not making it particularly worse.  She thinks that the medication has really helped with her symptoms.    She recently saw her rheumatologist.  They have started her on Plaquenil.  Of note, she did have COVID-19 and reports being quite sick with that.  However her symptoms have resolved.  She indicates that her bladder symptoms are somewhat different now.  She feels a sensation of tingling in her hands, torso, and head.  This occurs prior to urination.  I explained that there is not a clear reason for this but that some patients do feel though sort of sensations at times.    She reports having 2 urinary tract infections 1 of which during her Covid infection.  These were treated with antibiotics and have resolved.  She has been diagnosed with a left bundle branch block by cardiology.      Medical History:   Diagnosis Date   ? Arthritis    ? Back pain 07/18/2007   ? Chest pain    ? COVID-19 12/28/2018   ? Depression    ? Environmental allergies    ? 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 Hereditary Cancer Update Test - [place])   ? GERD (gastroesophageal reflux disease)    ? Hyperlipemia    ? Hypoglycemia ? 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 Neuropathy    ? Osteoporosis 2/09    BMD  T-score (-2.3) spine and (-2.9) femur on Actonel   ? Personal history of malignant neoplasm of breast    ? PN (Peripheral Neuropathy)    ? Sjogren's disease (HCC)    ? Vitamin D deficiency 6/09     Surgical History:   Procedure Laterality Date   ? CARDIAC CATHERIZATION  03/28/2005    LAD 20%, Circ ostial 30%, EF 65%   ? HX BREAST LUMPECTOMY  06/07/05    Left with SLNBX (0/2)   ? COLONOSCOPY N/A 03/01/2010    normal ([name], [facility])   ? HX HYSTERECTOMY  03/24/2011    [name], MD [facility]   ? HX BLADDER SUSPENSION     ? HX TUBAL LIGATION       Social History     Socioeconomic History   ? Marital status: Divorced     Spouse name: Not on file   ? Number of children: 2   ? Years of education: Not on file   ? Highest education level: GED or equivalent   Occupational History   ? Occupation: grounds Biomedical engineer  Employer: [name]   Tobacco Use   ? Smoking status: Former Smoker     Packs/day: 1.00     Years: 14.00     Pack years: 14.00     Types: Cigarettes     Quit date: 04/07/2000     Years since quitting: 18.9   ? Smokeless tobacco: Never Used   Substance and Sexual Activity   ? Alcohol use: Not Currently   ? Drug use: No   ? Sexual activity: Never   Other Topics Concern   ? Not on file   Social History Narrative   ? Not on file     Family History   Problem Relation Age of Onset   ? Heart Disease Mother    ? Hypoglycemia Mother    ? Heart Disease Father         farmer   ? Coronary Artery Disease Father    ? Heart Attack Brother 45   ? Complete Hysterectomy Sister         rotator cuff surgery   ? Diabetes Type II Maternal Grandmother    ? Cancer-Hematologic Daughter 46   ? COPD Daughter         on oxygen   ? Tobacco disorder Daughter         on Chantix   ? Complete Hysterectomy Daughter 36 ? None Reported Grandchild    ? Other Grandchild         wrestler-stomach problems   ? None Reported Grandchild    ? None Reported Grandchild    ? None Reported Grandchild    ? None Reported Grandchild    ? None Reported Sister    ? Heart murmur Sister    ? Heart Disease Sister    ? Osteopenia Sister    ? Depression Brother 56        hung himself 05/2017   ? Other Brother 0        died in utero-umbilical cord wrapped around him   ? Cancer-Breast Paternal Aunt 55        bilateral mastectomy   ? Cancer-Breast Other 60        paternal 1st cousin   ? Cancer-Lung Other 19        paternal 1st cousin   ? Cancer-Breast Other 40        paternal 1st cousin   ? Cancer-Ovarian Other 25   ? None Reported Other    ? Cancer-Breast Other 24        paternal 1st cousin   ? Brain Tumor Other 44        nephew   ? Cancer-Lung Paternal Uncle 45   ? Cervical Cancer Neg Hx    ? Cancer-Colon Neg Hx    ? Cancer-Uterine Neg Hx    ? Bleeding Disorders Neg Hx    ? Diabetes Neg Hx      Allergies   Allergen Reactions   ? Sulfa (Sulfonamide Antibiotics) RASH and NAUSEA AND VOMITING     Allergy recorded in SMS: Sulfa              Review of Systems   Constitutional: Positive for chills, fatigue and unexpected weight change. Negative for activity change, appetite change, diaphoresis and fever.   HENT: Positive for sinus pressure. Negative for congestion, hearing loss and mouth sores.    Eyes: Positive for visual disturbance.   Respiratory: Positive for shortness of breath. Negative for apnea, cough and chest  tightness.    Cardiovascular: Positive for palpitations. Negative for chest pain and leg swelling.   Gastrointestinal: Negative for abdominal pain, blood in stool, constipation, diarrhea, nausea, rectal pain and vomiting.   Endocrine: Negative. Genitourinary: Positive for frequency. Negative for decreased urine volume, difficulty urinating, dyspareunia, dysuria, enuresis, flank pain, genital sores, hematuria, pelvic pain, urgency, vaginal bleeding, vaginal discharge and vaginal pain.   Musculoskeletal: Positive for arthralgias, back pain and myalgias. Negative for gait problem.   Skin: Negative for rash and wound.   Allergic/Immunologic: Negative.    Neurological: Negative for dizziness, tremors, seizures, syncope, weakness, light-headedness, numbness and headaches.   Hematological: Negative for adenopathy. Bruises/bleeds easily.   Psychiatric/Behavioral: Negative for decreased concentration and dysphoric mood. The patient is nervous/anxious.          Objective:         ? alendronate (FOSAMAX) 70 mg tablet Take one tablet by mouth every 7 days. Take at least 30 minutes before breakfast with plain water. Do not lie down for 30 minutes.   ? amitriptyline (ELAVIL) 25 mg tablet Take 25 mg by mouth at bedtime as needed.   ? aspirin EC 81 mg tablet Take 81 mg by mouth daily. Taking every 3 days   ? benzonatate (TESSALON PERLES) 100 mg capsule Take 100 mg by mouth every 8 hours as needed for Cough.   ? CALCIUM CARBONATE (CALCIUM 500 PO) Take 1 Cap by mouth Daily.   ? Cholecalciferol (Vitamin D3) (VITAMIN D) 1,000 unit cap Take 3 Caps by mouth daily.   ? cyclobenzaprine (FLEXERIL) 5 mg tablet Take one tablet by mouth three times daily as needed for Muscle Cramps.   ? fluticasone propionate (FLONASE) 50 mcg/actuation nasal spray, suspension Apply 1 spray to each nostril as directed as Needed.   ? hydrOXYchloroQUINE (PLAQUENIL) 200 mg tablet TAKE 1 TABLET BY MOUTH ONCE DAILY FOR 7 DAYS THEN INCREASE TO 2 TABLETS ONCE DAILY WITH FOOD   ? ibuprofen (MOTRIN) 600 mg tablet Take 1 Tab by mouth every 6 hours as needed for Pain.   ? metoprolol XL (TOPROL XL) 50 mg extended release tablet Take 1 tablet by mouth once daily ? oxybutynin XL (DITROPAN XL) 10 mg tablet Take one tablet by mouth daily. Do not cut/ crush/ chew   ? potassium/magnesium (MAGNESIUM-POTASSIUM PO) Take  by mouth daily.     Vitals:    03/14/19 0832   BP: 122/74   Pulse: 69   Weight: 68 kg (150 lb)   Height: 158.8 cm (62.52)   PainSc: Zero     Body mass index is 26.98 kg/m?Marland Kitchen     Physical Exam  Vitals signs reviewed.   Constitutional:       General: She is not in acute distress.     Appearance: Normal appearance. She is well-developed and normal weight. She is not ill-appearing.   HENT:      Head: Normocephalic and atraumatic.   Eyes:      Extraocular Movements: Extraocular movements intact.   Neck:      Musculoskeletal: Normal range of motion.   Pulmonary:      Effort: Pulmonary effort is normal. No respiratory distress.   Musculoskeletal: Normal range of motion.   Skin:     General: Skin is warm and dry.   Neurological:      General: No focal deficit present.      Mental Status: She is alert and oriented to person, place, and time. Mental status is at baseline.  Psychiatric:         Mood and Affect: Mood normal.         Behavior: Behavior normal.         Thought Content: Thought content normal.         Judgment: Judgment normal.         PVR = 20 mL  UA negative      Assessment and Plan:  Ms. Schuller presents to clinic for follow-up as outlined.  She has a history of urinary urgency, frequency, and urge incontinence with nocturia.  She is done very well.  She is doing behavioral therapies as well as oxybutynin extended release 10 mg once daily.  She denies significant side effects or problems with the medication.  She is very pleased with her progress.  She is good to continue to monitor symptoms closely.  She will continue on her medication.  She will follow-up again in clinic in 1 year or sooner for problems.    History and examination reviewed and key elements confirmed.  Discussed in detail with the patient and questions answered. Total visit time today was approximately 40 minutes including history, exam and counseling.    I personally saw and evaluated the patient and determined the plan of care.    Najat Olazabal L. Littie Deeds, MD, MPH

## 2019-03-14 NOTE — Patient Instructions
Urinary Urgency Suppression Instructions    When you experience an urge to void:    First:  Stop & stand still.  Sit down if you can.  Don't move.  You need to stay very still to maintain control.    Second:  Relax.  Take a deep breath, then let it out.  Try to think of something other than the bathroom.  Try to make the urge go away.    Third:  Contract your pelvic muscles 3 to 4 times to keep from leaking.  Finally, when you feel the urge go away, walk normally to the bathroom.    If the urge recurs on the way, stop & repeat.  Practice this at home until you are able to suppress the urge & feel confident.  Do not be discouraged if control is not immediate.    Adapted from [facility] -- Bennie [place].  Treatment program 6-34.  =======================================================================      Kegel Exercises    1.  During urination, attempt to stop your stream.  The muscle used to stop urination is called the external urinary sphincter.    2.  After you have correctly identified this muscle, you should follow a sphincter strengthening program outside of urination.    Follow this Schedule:  -- Slowly contract your sphincter muscle to a count of five.  Slowly release to a count of five.  Repeat 10 times. This is called a set.   -- It is important not to tense other muscles during this exercise.  You're your abdominal (belly), thigh, and butt muscles relaxed while you are doing Kegel exercises.    -- Repeat a set of exercises at least four times daily or up to once every hour.  To help you remember a time schedule, do a set at breakfast, lunch, dinner and bedtime.  Vary the position in which you do the exercises such as standing, lying, walking, and driving.  Different positions will strengthen the muscles in different ways.    -- To help prevent leakage with increased abdominal pressure, you may do a single Kegel squeeze and count of five when you are bending, stooping, getting up out of a chair, or bed or when lifting.    -- It is normal to experience leakage for several weeks or even several months after surgery.  Time, patience, and Kegel exercises will help restore your urinary control.    -- Guards for Men [employer]) is a pad that fits into your jockey shorts to absorb small to moderate amounts of urine.  It has an adhesive strip which adheres to the inside of your underwear.  You may purchase these pads at any major pharmacy or discount store.  For large amounts of leakage, you may wish to purchase Depends underwear.  It's a full brief that you use in place of underwear.  ====================================================================================        Constipation Relief Recipe    1 cup unsweetened applesauce  1 cup unprocessed wheat bran (a.k.a Miller?s bran)  1/4 cup prune juice    Mix all of the ingredients together.  Take 2 tablespoons a day for one week and drink with a full glass of water.  If bowel movements are not greatly improved, increase to 4 tablespoons a day for one week and continue to increase as needed.  The maximum dose is 8 tablespoons a day.  Each time you take the recipe, you need to drink an 8 ounce glass of water  with it.    The mixture will keep for about one week in a plastic container in the refrigerator.

## 2019-03-14 NOTE — Progress Notes
PVR = 20 mL

## 2019-04-09 ENCOUNTER — Encounter: Admit: 2019-04-09 | Discharge: 2019-04-09 | Payer: MEDICARE

## 2019-04-09 ENCOUNTER — Ambulatory Visit: Admit: 2019-04-09 | Discharge: 2019-04-09 | Payer: MEDICARE

## 2019-04-09 DIAGNOSIS — G629 Polyneuropathy, unspecified: Secondary | ICD-10-CM

## 2019-04-09 DIAGNOSIS — M810 Age-related osteoporosis without current pathological fracture: Secondary | ICD-10-CM

## 2019-04-09 DIAGNOSIS — E559 Vitamin D deficiency, unspecified: Secondary | ICD-10-CM

## 2019-04-09 DIAGNOSIS — Z853 Personal history of malignant neoplasm of breast: Secondary | ICD-10-CM

## 2019-04-09 DIAGNOSIS — Z9109 Other allergy status, other than to drugs and biological substances: Secondary | ICD-10-CM

## 2019-04-09 DIAGNOSIS — U071 COVID-19: Secondary | ICD-10-CM

## 2019-04-09 DIAGNOSIS — M3500 Sicca syndrome, unspecified: Secondary | ICD-10-CM

## 2019-04-09 DIAGNOSIS — K219 Gastro-esophageal reflux disease without esophagitis: Secondary | ICD-10-CM

## 2019-04-09 DIAGNOSIS — E785 Hyperlipidemia, unspecified: Secondary | ICD-10-CM

## 2019-04-09 DIAGNOSIS — M5137 Other intervertebral disc degeneration, lumbosacral region: Secondary | ICD-10-CM

## 2019-04-09 DIAGNOSIS — M1990 Unspecified osteoarthritis, unspecified site: Secondary | ICD-10-CM

## 2019-04-09 DIAGNOSIS — F329 Major depressive disorder, single episode, unspecified: Secondary | ICD-10-CM

## 2019-04-09 DIAGNOSIS — Z1379 Encounter for other screening for genetic and chromosomal anomalies: Secondary | ICD-10-CM

## 2019-04-09 DIAGNOSIS — M542 Cervicalgia: Secondary | ICD-10-CM

## 2019-04-09 DIAGNOSIS — E162 Hypoglycemia, unspecified: Secondary | ICD-10-CM

## 2019-04-09 DIAGNOSIS — C50212 Malignant neoplasm of upper-inner quadrant of left female breast: Secondary | ICD-10-CM

## 2019-04-09 DIAGNOSIS — M549 Dorsalgia, unspecified: Secondary | ICD-10-CM

## 2019-04-09 MED ORDER — TRAMADOL 50 MG PO TAB
50 mg | ORAL_TABLET | ORAL | 0 refills | Status: DC | PRN
Start: 2019-04-09 — End: 2019-06-04

## 2019-04-11 ENCOUNTER — Encounter: Admit: 2019-04-11 | Discharge: 2019-04-11 | Payer: MEDICARE

## 2019-05-27 MED ORDER — HYDROXYCHLOROQUINE 200 MG PO TAB
ORAL_TABLET | Freq: Every day | 0 refills
Start: 2019-05-27 — End: ?

## 2019-05-28 MED ORDER — HYDROXYCHLOROQUINE 200 MG PO TAB
ORAL_TABLET | Freq: Every day | 0 refills
Start: 2019-05-28 — End: ?

## 2019-05-31 MED ORDER — HYDROXYCHLOROQUINE 200 MG PO TAB
200 mg | ORAL_TABLET | Freq: Two times a day (BID) | ORAL | 3 refills | 90.00000 days | Status: AC
Start: 2019-05-31 — End: ?

## 2019-05-31 MED ORDER — ALENDRONATE 70 MG PO TAB
70 mg | ORAL_TABLET | ORAL | 0 refills | Status: DC
Start: 2019-05-31 — End: 2019-07-30

## 2019-06-03 MED ORDER — HYDROXYCHLOROQUINE 200 MG PO TAB
ORAL_TABLET | Freq: Every day | 0 refills
Start: 2019-06-03 — End: ?

## 2019-06-04 MED ORDER — TRAMADOL 50 MG PO TAB
50 mg | ORAL_TABLET | ORAL | 0 refills | Status: AC | PRN
Start: 2019-06-04 — End: ?

## 2019-06-06 MED ORDER — FLECAINIDE 50 MG PO TAB
50 mg | ORAL_TABLET | Freq: Two times a day (BID) | ORAL | 3 refills | 30.00000 days | Status: AC
Start: 2019-06-06 — End: ?

## 2019-06-11 ENCOUNTER — Encounter: Admit: 2019-06-11 | Discharge: 2019-06-11 | Payer: MEDICARE

## 2019-06-11 NOTE — Progress Notes
Patient in the office today for an EKG after starting Flecainide last week. Patient denies any complaints at this time. Visit will be routed to [facility] for his review of EKG for further recommendations if needed.

## 2019-06-12 ENCOUNTER — Encounter: Admit: 2019-06-12 | Discharge: 2019-06-12 | Payer: MEDICARE

## 2019-06-12 DIAGNOSIS — M5137 Other intervertebral disc degeneration, lumbosacral region: Secondary | ICD-10-CM

## 2019-06-13 ENCOUNTER — Ambulatory Visit: Admit: 2019-06-13 | Discharge: 2019-06-13 | Payer: MEDICARE

## 2019-06-13 ENCOUNTER — Encounter: Admit: 2019-06-13 | Discharge: 2019-06-13 | Payer: MEDICARE

## 2019-06-13 MED ORDER — METHYLPREDNISOLONE ACETATE 80 MG/ML IJ SUSP
80 mg | Freq: Once | INTRAMUSCULAR | 0 refills | Status: CP
Start: 2019-06-13 — End: ?
  Administered 2019-06-13: 17:00:00 80 mg via INTRAMUSCULAR

## 2019-06-13 MED ORDER — IOPAMIDOL 41 % IT SOLN
2.5 mL | Freq: Once | EPIDURAL | 0 refills | Status: CP
Start: 2019-06-13 — End: ?
  Administered 2019-06-13: 17:00:00 2.5 mL via EPIDURAL

## 2019-06-13 NOTE — Progress Notes
[facility]  INTERVENTIONAL PAIN PROCEDURE HISTORY AND PHYSICAL    Chief Complaint   Patient presents with   ? Buttocks pain       HISTORY OF PRESENT ILLNESS:  pain    Medical History:   Diagnosis Date   ? Arthritis    ? Back pain 07/18/2007   ? Chest pain    ? COVID-19 12/28/2018   ? Depression    ? Environmental allergies    ? 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 Hereditary Cancer Update Test - [place])   ? GERD (gastroesophageal reflux disease)    ? Hyperlipemia    ? Hypoglycemia    ? 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 Neuropathy    ? Osteoporosis 2/09    BMD  T-score (-2.3) spine and (-2.9) femur on Actonel   ? Personal history of malignant neoplasm of breast    ? PN (Peripheral Neuropathy)    ? Sjogren's disease (HCC)    ? Vitamin D deficiency 6/09       Surgical History:   Procedure Laterality Date   ? CARDIAC CATHERIZATION  03/28/2005    LAD 20%, Circ ostial 30%, EF 65%   ? HX BREAST LUMPECTOMY  06/07/05    Left with SLNBX (0/2)   ? COLONOSCOPY N/A 03/01/2010    normal ([name], MD-[facility])   ? HX HYSTERECTOMY  03/24/2011    [name], MD [facility]   ? HX BLADDER SUSPENSION     ? HX TUBAL LIGATION         family history includes Brain Tumor (age of onset: 41) in an other family member; COPD in her daughter; Essie Christine (age of onset: 25) in an other family member; Physiological scientist (age of onset: 76) in an other family member; Physiological scientist (age of onset: 76) in her paternal aunt; Cancer-Breast (age of onset: 45) in an other family member; Cancer-Hematologic (age of onset: 71) in her daughter; Teresa Coombs (age of onset: 78) in her paternal uncle; Cancer-Lung (age of onset: 34) in an other family member; Cancer-Ovarian (age of onset: 56) in an other family member; Complete Hysterectomy in her sister; Complete Hysterectomy (age of onset: 14) in her daughter; Coronary Artery Disease in her father; Depression (age of onset: 18) in her brother; Diabetes Type II in her maternal grandmother; Heart Attack (age of onset: 66) in her brother; Heart Disease in her father, mother, and sister; Heart murmur in her sister; Hypoglycemia in her mother; None Reported in her grandchild, grandchild, grandchild, grandchild, grandchild, sister, and another family member; Osteopenia in her sister; Other in her grandchild; Other (age of onset: 0) in her brother; Tobacco disorder in her daughter.    Social History     Socioeconomic History   ? Marital status: Divorced     Spouse name: Not on file   ? Number of children: 2   ? Years of education: Not on file   ? Highest education level: GED or equivalent   Occupational History   ? Occupation: grounds Human resources officer: [name]   Tobacco Use   ? Smoking status: Former Smoker     Packs/day: 1.00     Years: 14.00     Pack years: 14.00     Types: Cigarettes     Quit  date: 04/07/2000     Years since quitting: 19.1   ? Smokeless tobacco: Never Used   Substance and Sexual Activity   ? Alcohol use: Not Currently   ? Drug use: No   ? Sexual activity: Never   Other Topics Concern   ? Not on file   Social History Narrative   ? Not on file       Allergies   Allergen Reactions   ? Sulfa (Sulfonamide Antibiotics) RASH and NAUSEA AND VOMITING     Allergy recorded in SMS: Sulfa       Vitals:    06/13/19 1044   BP: (!) 143/74   BP Source: Arm, Right Upper   Patient Position: Sitting   Resp: 18   Temp: 36.7 ?C (98.1 ?F)   TempSrc: Oral   SpO2: 98%   Weight: 68 kg (150 lb)   Height: 158.8 cm (62.5)   PainSc: Seven       REVIEW OF SYSTEMS: 10 point ROS obtained and negative except pain      PHYSICAL EXAM:General Appearance: moderately overweight, no distress     Respiratory Effort: breathing comfortably, no respiratory distress   Pedal Pulses: normal symmetric pedal pulses   Lower Extremity Edema: no lower extremity edema   Abdominal Exam: mildly protuberant but soft, non-tender, no masses, bowel sounds normal   Gait & Station: walks without assistance   Muscle Strength: normal muscle tone   Orientation: oriented to time, place and person   Affect & Mood: appropriate and sustained affect   Language and Memory: patient responsive and seems to comprehend information   Neurologic Exam: neurological assessment grossly intact   Other: moves all extremities          IMPRESSION:    1. Lumbar radiculopathy    2. Degeneration of lumbar or lumbosacral intervertebral disc         PLAN: Lumbar Epidural Steroid Injection

## 2019-06-13 NOTE — Patient Instructions
Procedure Completed Today: Lumbar Epidural Steroid Injection    Important information following your procedure today: You may drive today    1. Pain relief may not be immediate. It is possible you may even experience an increase in pain during the first 24-48 hours followed by a gradual decrease of your pain.  2. Though the procedure is generally safe and complications are rare, we do ask that you be aware of any of the following:   ? Any swelling, persistent redness, new bleeding, or drainage from the site of the injection.  ? You should not experience a severe headache.  ? You should not run a fever over 101? F.  ? New onset of sharp, severe back & or neck pain.  ? New onset of upper or lower extremity numbness or weakness.  ? New difficulty controlling bowel or bladder function after the injection.  ? New shortness of breath.    If any of these occur, please call to report this occurrence to a nurse at [phone]. If you are calling after 4:00 p.m., on weekends or holidays please call [phone] and ask to have the resident physician on call for the physician paged or go to your local emergency room.  3. You may experience soreness at the injection site. Ice can be applied at 20 minute intervals. Avoid application of direct heat, hot showers or hot tubs today.  4. Avoid strenuous activity today. You may resume your regular activities and exercise tomorrow.  5. Patients with diabetes may see an elevation in blood sugars for 7-10 days after the injection. It is important to pay close attention to your diet, check your blood sugars daily and report extreme elevations to the physician that treats your diabetes.  6. Patients taking a daily blood thinner can resume their regular dose this evening.  7. It is important that you take all medications ordered by your pain physician. Taking medication as ordered is an important part of your pain care plan. If you cannot continue the medication plan, please notify the physician.     Possible side effects to steroids that may occur:  ? Flushing or redness of the face  ? Irritability  ? Fluid retention  ? Change in women?s menses    The following medications were used: Lidocaine , Depomedrol and Contrast Dye

## 2019-06-13 NOTE — Procedures
Attending Surgeon: Jerilynn Som, MD    Anesthesia: Local    Pre-Procedure Diagnosis:   1. Lumbar radiculopathy    2. Degeneration of lumbar or lumbosacral intervertebral disc        Post-Procedure Diagnosis:   1. Lumbar radiculopathy    2. Degeneration of lumbar or lumbosacral intervertebral disc        [facility] INJECT INTERLAM LMBR/SAC  Procedure: epidural - interlaminar    Laterality: n/a    Location: lumbar - L5-S1      Consent:   Consent obtained: written  Consent given by: patient  Risks discussed: allergic reaction, bleeding, bruising, infection, no change or worsening in pain, reaction to medication and weakness  Alternatives discussed: alternative treatment, delayed treatment, no treatment and referral  Discussed with patient the purpose of the treatment/procedure, other ways of treating my condition, including no treatment/ procedure and the risks and benefits of the alternatives. Patient has decided to proceed with treatment/procedure.        Universal Protocol:  Relevant documents: relevant documents present and verified  Test results: test results available and properly labeled  Imaging studies: imaging studies available  Required items: required blood products, implants, devices, and special equipment available  Site marked: the operative site was marked  Patient identity confirmed: Patient identify confirmed verbally with patient.        Time out: Immediately prior to procedure a time out was called to verify the correct patient, procedure, equipment, support staff and site/side marked as required      Procedures Details:   Indications: pain   Prep: Betadine  Patient position: prone  Estimated Blood Loss: minimal  Specimens: none  Number of Levels: 1  Approach: midline  Guidance: fluoroscopy  Contrast: Procedure confirmed with contrast under live fluoroscopy.  Needle and Epidural Catheter: tuohy  Needle size: 18 G  Injection procedure: Incremental injection, Negative aspiration for blood and Introduced needle  Patient tolerance: Patient tolerated the procedure well with no immediate complications. Pressure was applied, and hemostasis was accomplished.  Outcome: Pain improved  Comments: INTERVENTIONAL PAIN MANAGEMENT PROCEDURE REPORT    Lumbar Interlaminar Epidural Steroid Injection    Date of Service: @ED @    Procedure Title(s):    1. L5-S1 epidural injection   2. Intraoperative fluoroscopy     Attending Surgeon: @ENCPROVNMTITLE @    Anesthesia: Local                       Anxiolysis No           Procedural Sedation No    Indications: Kristen Norris is a 69 y.o. female with a diagnosis of .diag  . The patient's history and physical exam were reviewed. The risks, benefits and alternatives to the procedure were discussed, and all questions were answered to the patient's satisfaction. The patient agreed to proceed, and written informed consent was obtained.     Procedure in Detail: IV was started? No    The patient was brought into the procedure room and placed in the prone position on the fluoroscopy table. Standard monitors were placed, and vital signs were observed throughout the procedure. The area of the lumbar spine was prepped with Hibaclens and draped in a sterile manner.     The L5-S1 interspace was identified and marked under AP fluoroscopy. The skin and subcutaneous tissues in the area were anesthetized with 1% lidocaine. A 18-gauge Tuohy epidural needle was directed toward the interspace under fluoroscopic guidance until  the ligamentum flavum was engaged. From this point, a loss of resistance technique with a glass syringe and saline was used to identify entrance of the needle into the epidural space using AP and lateral fluoroscopic guidance. Once an appropriate loss of resistance was obtained, negative aspiration was confirmed and 1ml of contrast solution was injected. An appropriate epidurogram was noted on AP and lateral fluoroscopy with no vascular or intrathecal uptake.    Then, after negative aspiration, a solution consisting of 2 mL local anesthetic, 2 mL of depo (steroid) and 2 mL preservative-free saline was easily injected. The needle was removed with a 1% lidocaine flush. The patient?s back was cleaned and a bandage was placed over the site of needle insertion.    Disposition: The patient tolerated the procedure well, and there were no apparent complications. Vital signs remained stable througtout the procedure. The patient was taken to the recovery area where discharge instructions for the procedure were given.     Estimated Blood Loss: minimal    Specimens: none    Complications: none        Estimated blood loss: none or minimal  Specimens: none  Patient tolerated the procedure well with no immediate complications. Pressure was applied, and hemostasis was accomplished.

## 2019-06-13 NOTE — Progress Notes

## 2019-07-03 ENCOUNTER — Encounter: Admit: 2019-07-03 | Discharge: 2019-07-03 | Payer: MEDICARE

## 2019-07-03 MED ORDER — METOPROLOL SUCCINATE 50 MG PO TB24
ORAL_TABLET | Freq: Every day | ORAL | 11 refills | 90.00000 days | Status: AC
Start: 2019-07-03 — End: ?

## 2019-07-09 ENCOUNTER — Encounter: Admit: 2019-07-09 | Discharge: 2019-07-09 | Payer: MEDICARE

## 2019-07-09 ENCOUNTER — Ambulatory Visit: Admit: 2019-07-09 | Discharge: 2019-07-10 | Payer: MEDICARE

## 2019-07-09 DIAGNOSIS — M1990 Unspecified osteoarthritis, unspecified site: Secondary | ICD-10-CM

## 2019-07-09 DIAGNOSIS — E162 Hypoglycemia, unspecified: Secondary | ICD-10-CM

## 2019-07-09 DIAGNOSIS — K219 Gastro-esophageal reflux disease without esophagitis: Secondary | ICD-10-CM

## 2019-07-09 DIAGNOSIS — Z853 Personal history of malignant neoplasm of breast: Secondary | ICD-10-CM

## 2019-07-09 DIAGNOSIS — M810 Age-related osteoporosis without current pathological fracture: Secondary | ICD-10-CM

## 2019-07-09 DIAGNOSIS — C50212 Malignant neoplasm of upper-inner quadrant of left female breast: Secondary | ICD-10-CM

## 2019-07-09 DIAGNOSIS — G629 Polyneuropathy, unspecified: Secondary | ICD-10-CM

## 2019-07-09 DIAGNOSIS — F329 Major depressive disorder, single episode, unspecified: Secondary | ICD-10-CM

## 2019-07-09 DIAGNOSIS — E785 Hyperlipidemia, unspecified: Secondary | ICD-10-CM

## 2019-07-09 DIAGNOSIS — M3500 Sicca syndrome, unspecified: Secondary | ICD-10-CM

## 2019-07-09 DIAGNOSIS — Z9109 Other allergy status, other than to drugs and biological substances: Secondary | ICD-10-CM

## 2019-07-09 DIAGNOSIS — M549 Dorsalgia, unspecified: Secondary | ICD-10-CM

## 2019-07-09 DIAGNOSIS — E559 Vitamin D deficiency, unspecified: Secondary | ICD-10-CM

## 2019-07-09 DIAGNOSIS — U071 COVID-19: Secondary | ICD-10-CM

## 2019-07-09 DIAGNOSIS — Z1379 Encounter for other screening for genetic and chromosomal anomalies: Secondary | ICD-10-CM

## 2019-07-09 MED ORDER — ZOLEDRONIC ACID-MANNITOL-WATER 5 MG/100 ML IV PGBK
5 mg | Freq: Once | INTRAVENOUS | 0 refills | Status: CN
Start: 2019-07-09 — End: ?

## 2019-07-24 ENCOUNTER — Encounter: Admit: 2019-07-24 | Discharge: 2019-07-24 | Payer: MEDICARE

## 2019-07-24 DIAGNOSIS — M5137 Other intervertebral disc degeneration, lumbosacral region: Secondary | ICD-10-CM

## 2019-07-25 ENCOUNTER — Encounter: Admit: 2019-07-25 | Discharge: 2019-07-25 | Payer: MEDICARE

## 2019-07-25 ENCOUNTER — Ambulatory Visit: Admit: 2019-07-25 | Discharge: 2019-07-25 | Payer: MEDICARE

## 2019-07-25 DIAGNOSIS — E162 Hypoglycemia, unspecified: Secondary | ICD-10-CM

## 2019-07-25 DIAGNOSIS — Z9109 Other allergy status, other than to drugs and biological substances: Secondary | ICD-10-CM

## 2019-07-25 DIAGNOSIS — M549 Dorsalgia, unspecified: Secondary | ICD-10-CM

## 2019-07-25 DIAGNOSIS — M810 Age-related osteoporosis without current pathological fracture: Secondary | ICD-10-CM

## 2019-07-25 DIAGNOSIS — G629 Polyneuropathy, unspecified: Secondary | ICD-10-CM

## 2019-07-25 DIAGNOSIS — M3500 Sicca syndrome, unspecified: Secondary | ICD-10-CM

## 2019-07-25 DIAGNOSIS — Z853 Personal history of malignant neoplasm of breast: Secondary | ICD-10-CM

## 2019-07-25 DIAGNOSIS — C50212 Malignant neoplasm of upper-inner quadrant of left female breast: Secondary | ICD-10-CM

## 2019-07-25 DIAGNOSIS — M5137 Other intervertebral disc degeneration, lumbosacral region: Secondary | ICD-10-CM

## 2019-07-25 DIAGNOSIS — E559 Vitamin D deficiency, unspecified: Secondary | ICD-10-CM

## 2019-07-25 DIAGNOSIS — K219 Gastro-esophageal reflux disease without esophagitis: Secondary | ICD-10-CM

## 2019-07-25 DIAGNOSIS — Z1379 Encounter for other screening for genetic and chromosomal anomalies: Secondary | ICD-10-CM

## 2019-07-25 DIAGNOSIS — M5414 Radiculopathy, thoracic region: Secondary | ICD-10-CM

## 2019-07-25 DIAGNOSIS — M1990 Unspecified osteoarthritis, unspecified site: Secondary | ICD-10-CM

## 2019-07-25 DIAGNOSIS — E785 Hyperlipidemia, unspecified: Secondary | ICD-10-CM

## 2019-07-25 DIAGNOSIS — U071 COVID-19: Secondary | ICD-10-CM

## 2019-07-25 DIAGNOSIS — F329 Major depressive disorder, single episode, unspecified: Secondary | ICD-10-CM

## 2019-07-25 MED ORDER — TRIAMCINOLONE ACETONIDE 40 MG/ML IJ SUSP
80 mg | Freq: Once | EPIDURAL | 0 refills | Status: CP
Start: 2019-07-25 — End: ?
  Administered 2019-07-25: 16:00:00 80 mg via EPIDURAL

## 2019-07-25 NOTE — Patient Instructions
Procedure Completed Today: Thoracic Epidural Steroid Injection    Important information following your procedure today: You may drive today    1. Pain relief may not be immediate. It is possible you may even experience an increase in pain during the first 24-48 hours followed by a gradual decrease of your pain.  2. Though the procedure is generally safe and complications are rare, we do ask that you be aware of any of the following:   ? Any swelling, persistent redness, new bleeding, or drainage from the site of the injection.  ? You should not experience a severe headache.  ? You should not run a fever over 101? F.  ? New onset of sharp, severe back & or neck pain.  ? New onset of upper or lower extremity numbness or weakness.  ? New difficulty controlling bowel or bladder function after the injection.  ? New shortness of breath.    If any of these occur, please call to report this occurrence to a nurse at [phone]. If you are calling after 4:00 p.m., on weekends or holidays please call [phone] and ask to have the resident physician on call for the physician paged or go to your local emergency room.  3. You may experience soreness at the injection site. Ice can be applied at 20 minute intervals. Avoid application of direct heat, hot showers or hot tubs today.  4. Avoid strenuous activity today. You may resume your regular activities and exercise tomorrow.  5. Patients with diabetes may see an elevation in blood sugars for 7-10 days after the injection. It is important to pay close attention to your diet, check your blood sugars daily and report extreme elevations to the physician that treats your diabetes.  6. Patients taking a daily blood thinner can resume their regular dose this evening.  7. It is important that you take all medications ordered by your pain physician. Taking medication as ordered is an important part of your pain care plan. If you cannot continue the medication plan, please notify the physician.     Possible side effects to steroids that may occur:  ? Flushing or redness of the face  ? Irritability  ? Fluid retention  ? Change in women?s menses    The following medications were used: Lidocaine , Triamcinolone   and Contrast Dye

## 2019-07-29 ENCOUNTER — Encounter: Admit: 2019-07-29 | Discharge: 2019-07-29 | Payer: MEDICARE

## 2019-07-29 NOTE — Telephone Encounter
Patient called and she stopped fosamax and she is doing fine without the fosamax, and has relief of GERD symptoms.     She is asking about an infusion.   Returned call and advised it is ordered.

## 2019-07-31 ENCOUNTER — Encounter: Admit: 2019-07-31 | Discharge: 2019-07-31 | Payer: MEDICARE

## 2019-07-31 ENCOUNTER — Ambulatory Visit: Admit: 2019-07-31 | Discharge: 2019-08-01 | Payer: MEDICARE

## 2019-07-31 DIAGNOSIS — G629 Polyneuropathy, unspecified: Secondary | ICD-10-CM

## 2019-07-31 DIAGNOSIS — E785 Hyperlipidemia, unspecified: Secondary | ICD-10-CM

## 2019-07-31 DIAGNOSIS — M549 Dorsalgia, unspecified: Secondary | ICD-10-CM

## 2019-07-31 DIAGNOSIS — U071 COVID-19: Secondary | ICD-10-CM

## 2019-07-31 DIAGNOSIS — M1990 Unspecified osteoarthritis, unspecified site: Secondary | ICD-10-CM

## 2019-07-31 DIAGNOSIS — E559 Vitamin D deficiency, unspecified: Secondary | ICD-10-CM

## 2019-07-31 DIAGNOSIS — E162 Hypoglycemia, unspecified: Secondary | ICD-10-CM

## 2019-07-31 DIAGNOSIS — F329 Major depressive disorder, single episode, unspecified: Secondary | ICD-10-CM

## 2019-07-31 DIAGNOSIS — Z9109 Other allergy status, other than to drugs and biological substances: Secondary | ICD-10-CM

## 2019-07-31 DIAGNOSIS — Z1379 Encounter for other screening for genetic and chromosomal anomalies: Secondary | ICD-10-CM

## 2019-07-31 DIAGNOSIS — C50212 Malignant neoplasm of upper-inner quadrant of left female breast: Secondary | ICD-10-CM

## 2019-07-31 DIAGNOSIS — Z853 Personal history of malignant neoplasm of breast: Secondary | ICD-10-CM

## 2019-07-31 DIAGNOSIS — M810 Age-related osteoporosis without current pathological fracture: Secondary | ICD-10-CM

## 2019-07-31 DIAGNOSIS — M3500 Sicca syndrome, unspecified: Secondary | ICD-10-CM

## 2019-07-31 DIAGNOSIS — K219 Gastro-esophageal reflux disease without esophagitis: Secondary | ICD-10-CM

## 2019-07-31 MED ORDER — ZOLEDRONIC ACID-MANNITOL-WATER 5 MG/100 ML IV PGBK
5 mg | Freq: Once | INTRAVENOUS | 0 refills | Status: CN
Start: 2019-07-31 — End: ?

## 2019-07-31 MED ORDER — ZOLEDRONIC ACID-MANNITOL-WATER 5 MG/100 ML IV PGBK
5 mg | Freq: Once | INTRAVENOUS | 0 refills | Status: CP
Start: 2019-07-31 — End: ?
  Administered 2019-07-31: 20:00:00 5 mg via INTRAVENOUS

## 2019-07-31 NOTE — Progress Notes
1545:  Patient tolerated infusion without difficulty.  No signs of reaction noted.  Medication information provided.

## 2019-07-31 NOTE — Progress Notes
1540:  Patient tolerated infusion without difficulty.  No signs of reaction noted.  Medication information provided.

## 2019-07-31 NOTE — Patient Instructions
Reclast Injection 0.05 mg/mL  Uses  This medicine is used for the following purposes:  · bone disease  · bone strength  Instructions  This is an IV medicine. It is given through a sterile tube directly into the vein by a healthcare provider.  This medicine is given gradually through the IV line.  Please ask your doctor, nurse, or pharmacist how to discard unused medicines safely.  This medicine should be given by a trained health care provider.  Drink plenty of water while on this medicine.  Drink at least 2 glasses of water before treatment, unless instructed otherwise.  It may take several weeks for this medicine to fully work.  It is important that you keep taking each dose of this medicine on time even if you are feeling well.  If you miss a dose, contact your doctor for instructions.  Please tell your doctor and pharmacist about all the medicines you take. Include both prescription and over-the-counter medicines. Also tell them about any vitamins, herbal medicines, or anything else you take for your health.  Talk to your doctor before taking other medicines, including aspirins and ibuprofen containing products. Speak to your doctor about which medicines are safe to use while you are on this medicine.  Do not suddenly stop taking this medicine. Check with your doctor before stopping.  This medicine may affect the strength of your bones. If you have or are at increased risk for developing osteoporosis (weakening of the bones), your doctor may recommend adding foods containing calcium and vitamin D while on this medicine. Please talk to your doctor for more information.  You may need vitamin and mineral supplements while on this medicine. Please speak with your doctor or pharmacist.  Visit your dentist regularly. Proper care of your teeth is very important while taking this medicine. Brush your teeth and floss regularly.  It is very important that you follow your doctor's instructions for all blood tests.  It is  very important that you keep all appointments for medical exams and tests while on this medicine.  Cautions  Tell your doctor and pharmacist if you ever had an allergic reaction to a medicine. Symptoms of an allergic reaction can include trouble breathing, skin rash, itching, swelling, or severe dizziness.  Some patients taking this medicine have experienced serious side effects. Please speak with your doctor to understand the risks and benefits associated with this medicine.  Do not use the medication any more than instructed.  This medicine may cause dizziness or fainting, especially after exercising or in hot weather. Be very careful when standing or sitting up quickly.  Your ability to stay alert or to react quickly may be impaired by this medicine. Do not drive or operate machinery until you know how this medicine will affect you.  Please check with your doctor before drinking alcohol while on this medicine.  Avoid smoking while on this medicine. Smoking may increase your risk for bone fractures.  If possible, avoid using with marijuana or other medicines that can cause dizziness or drowsiness. These include allergy/cold products, muscle relaxers, sleep aids, and pain relievers.  Contact your doctor if you notice a change in the amount or darkening of your urine.  Tell the doctor or pharmacist if you are pregnant, planning to be pregnant, or breastfeeding.  Do not use this medicine if you are pregnant. If you become pregnant while on this medicine, contact your doctor immediately.  This medicine can hurt a new baby in the womb.   If you become pregnant while on this medicine, tell your doctor immediately. Your doctor may switch you to a different medicine.  Ask your pharmacist if this medicine can interact with any of your other medicines. Be sure to tell them about all the medicines you take.  Please tell all your doctors and dentists that you are on this medicine before they provide care.  Do not start or stop  any other medicines without first speaking to your doctor or pharmacist.  This medicine can cause serious side effects in some patients. Important information from the [place] Food and Drug Administration (FDA) is available from your pharmacist. Please review it carefully with your pharmacist to understand the risks associated with this medicine.  Side Effects  The following is a list of some common side effects from this medicine. Please speak with your doctor about what you should do if you experience these or other side effects.  · dizziness  · lack of energy and tiredness  · flu-like symptoms  · headaches  · reaction at the area of the injection (pain, redness, swelling)  · nausea  Call your doctor or get medical help right away if you notice any of these more serious side effects:  · bone pain  · fever or chills  · high fever  · fast or irregular heart beats  · jaw pain  · pain in the joints  · kidney problems  · mouth sores or irritation  · muscle cramps  · muscle pain  · tight or rigid muscles  · muscle weakness  · feeling of numbness or tingling in your hands and feet  · eye pain or swelling  · skin irritation such as redness, itching, rash, or burning  · redness of eyes  · seizures  · sensitivity to light  · shortness of breath  · skin tingling, burning or prickly feeling  · unusual or unexplained tiredness or weakness  · urinating less often  · dark urine  · blurring or changes of vision  · weakness  A few people may have an allergic reactions to this medicine. Symptoms can include difficulty breathing, skin rash, itching, swelling, or severe dizziness. If you notice any of these symptoms, seek medical help quickly.  Extra  Please speak with your doctor, nurse, or pharmacist if you have any questions about this medicine.  [email or web address]  IMPORTANT NOTE: This document tells you briefly how to take your medicine, but it does not tell you all there is to know about it.Your doctor  or pharmacist may give you other documents about your medicine. Please talk to them if you have any questions.Always follow their advice. There is a more complete description of this medicine available in English.Scan this code on your smartphone or tablet or use the web address below. You can also ask your pharmacist for a printout. If you have any questions, please ask your pharmacist.   © 2021 [employer]

## 2019-08-05 ENCOUNTER — Encounter: Admit: 2019-08-05 | Discharge: 2019-08-05 | Payer: MEDICARE

## 2019-08-06 ENCOUNTER — Encounter: Admit: 2019-08-06 | Discharge: 2019-08-06 | Payer: MEDICARE

## 2019-08-28 ENCOUNTER — Encounter: Admit: 2019-08-28 | Discharge: 2019-08-28 | Payer: MEDICARE

## 2019-08-28 DIAGNOSIS — M5137 Other intervertebral disc degeneration, lumbosacral region: Secondary | ICD-10-CM

## 2019-08-29 ENCOUNTER — Encounter: Admit: 2019-08-29 | Discharge: 2019-08-29 | Payer: MEDICARE

## 2019-08-29 ENCOUNTER — Ambulatory Visit: Admit: 2019-08-29 | Discharge: 2019-08-29 | Payer: MEDICARE

## 2019-08-29 DIAGNOSIS — M5416 Radiculopathy, lumbar region: Secondary | ICD-10-CM

## 2019-08-29 DIAGNOSIS — Z1379 Encounter for other screening for genetic and chromosomal anomalies: Secondary | ICD-10-CM

## 2019-08-29 DIAGNOSIS — M810 Age-related osteoporosis without current pathological fracture: Secondary | ICD-10-CM

## 2019-08-29 DIAGNOSIS — M5412 Radiculopathy, cervical region: Secondary | ICD-10-CM

## 2019-08-29 DIAGNOSIS — E785 Hyperlipidemia, unspecified: Secondary | ICD-10-CM

## 2019-08-29 DIAGNOSIS — Z853 Personal history of malignant neoplasm of breast: Secondary | ICD-10-CM

## 2019-08-29 DIAGNOSIS — M3500 Sicca syndrome, unspecified: Secondary | ICD-10-CM

## 2019-08-29 DIAGNOSIS — F329 Major depressive disorder, single episode, unspecified: Secondary | ICD-10-CM

## 2019-08-29 DIAGNOSIS — G629 Polyneuropathy, unspecified: Secondary | ICD-10-CM

## 2019-08-29 DIAGNOSIS — K219 Gastro-esophageal reflux disease without esophagitis: Secondary | ICD-10-CM

## 2019-08-29 DIAGNOSIS — M549 Dorsalgia, unspecified: Secondary | ICD-10-CM

## 2019-08-29 DIAGNOSIS — M1990 Unspecified osteoarthritis, unspecified site: Secondary | ICD-10-CM

## 2019-08-29 DIAGNOSIS — Z9109 Other allergy status, other than to drugs and biological substances: Secondary | ICD-10-CM

## 2019-08-29 DIAGNOSIS — E559 Vitamin D deficiency, unspecified: Secondary | ICD-10-CM

## 2019-08-29 DIAGNOSIS — U071 COVID-19: Secondary | ICD-10-CM

## 2019-08-29 DIAGNOSIS — M5137 Other intervertebral disc degeneration, lumbosacral region: Secondary | ICD-10-CM

## 2019-08-29 DIAGNOSIS — E162 Hypoglycemia, unspecified: Secondary | ICD-10-CM

## 2019-08-29 DIAGNOSIS — C50212 Malignant neoplasm of upper-inner quadrant of left female breast: Secondary | ICD-10-CM

## 2019-08-29 MED ORDER — METHYLPREDNISOLONE ACETATE 80 MG/ML IJ SUSP
80 mg | Freq: Once | INTRAMUSCULAR | 0 refills | Status: CP
Start: 2019-08-29 — End: ?
  Administered 2019-08-29: 15:00:00 80 mg via INTRAMUSCULAR

## 2019-08-29 MED ORDER — IOPAMIDOL 41 % IT SOLN
2.5 mL | Freq: Once | EPIDURAL | 0 refills | Status: CP
Start: 2019-08-29 — End: ?
  Administered 2019-08-29: 15:00:00 2.5 mL via EPIDURAL

## 2019-08-29 NOTE — Progress Notes

## 2019-09-02 ENCOUNTER — Encounter: Admit: 2019-09-02 | Discharge: 2019-09-02 | Payer: MEDICARE

## 2019-09-30 ENCOUNTER — Encounter: Admit: 2019-09-30 | Discharge: 2019-09-30 | Payer: MEDICARE

## 2019-10-02 ENCOUNTER — Encounter: Admit: 2019-10-02 | Discharge: 2019-10-02 | Payer: MEDICARE

## 2019-10-02 DIAGNOSIS — M5412 Radiculopathy, cervical region: Secondary | ICD-10-CM

## 2019-10-03 ENCOUNTER — Ambulatory Visit: Admit: 2019-10-03 | Discharge: 2019-10-03 | Payer: MEDICARE

## 2019-10-03 ENCOUNTER — Encounter: Admit: 2019-10-03 | Discharge: 2019-10-03 | Payer: MEDICARE

## 2019-10-03 DIAGNOSIS — E162 Hypoglycemia, unspecified: Secondary | ICD-10-CM

## 2019-10-03 DIAGNOSIS — E785 Hyperlipidemia, unspecified: Secondary | ICD-10-CM

## 2019-10-03 DIAGNOSIS — M1990 Unspecified osteoarthritis, unspecified site: Secondary | ICD-10-CM

## 2019-10-03 DIAGNOSIS — G629 Polyneuropathy, unspecified: Secondary | ICD-10-CM

## 2019-10-03 DIAGNOSIS — F329 Major depressive disorder, single episode, unspecified: Secondary | ICD-10-CM

## 2019-10-03 DIAGNOSIS — E559 Vitamin D deficiency, unspecified: Secondary | ICD-10-CM

## 2019-10-03 DIAGNOSIS — M5412 Radiculopathy, cervical region: Secondary | ICD-10-CM

## 2019-10-03 DIAGNOSIS — Z853 Personal history of malignant neoplasm of breast: Secondary | ICD-10-CM

## 2019-10-03 DIAGNOSIS — Z1379 Encounter for other screening for genetic and chromosomal anomalies: Secondary | ICD-10-CM

## 2019-10-03 DIAGNOSIS — M549 Dorsalgia, unspecified: Secondary | ICD-10-CM

## 2019-10-03 DIAGNOSIS — M3500 Sicca syndrome, unspecified: Secondary | ICD-10-CM

## 2019-10-03 DIAGNOSIS — C50212 Malignant neoplasm of upper-inner quadrant of left female breast: Secondary | ICD-10-CM

## 2019-10-03 DIAGNOSIS — U071 COVID-19: Secondary | ICD-10-CM

## 2019-10-03 DIAGNOSIS — Z9109 Other allergy status, other than to drugs and biological substances: Secondary | ICD-10-CM

## 2019-10-03 DIAGNOSIS — M810 Age-related osteoporosis without current pathological fracture: Secondary | ICD-10-CM

## 2019-10-03 DIAGNOSIS — K219 Gastro-esophageal reflux disease without esophagitis: Secondary | ICD-10-CM

## 2019-10-03 MED ORDER — IOPAMIDOL 41 % IT SOLN
2.5 mL | Freq: Once | EPIDURAL | 0 refills | Status: CP
Start: 2019-10-03 — End: ?
  Administered 2019-10-03: 15:00:00 2.5 mL via EPIDURAL

## 2019-10-03 MED ORDER — TRIAMCINOLONE ACETONIDE 40 MG/ML IJ SUSP
80 mg | Freq: Once | EPIDURAL | 0 refills | Status: CP
Start: 2019-10-03 — End: ?
  Administered 2019-10-03: 15:00:00 80 mg via EPIDURAL

## 2019-10-03 MED ORDER — METHOCARBAMOL 500 MG PO TAB
1000 mg | ORAL_TABLET | Freq: Four times a day (QID) | ORAL | 0 refills | Status: AC
Start: 2019-10-03 — End: ?

## 2019-10-03 NOTE — Patient Instructions
Procedure Completed Today: Cervical Epidural Steroid Injection    Important information following your procedure today: You may drive today    1. Pain relief may not be immediate. It is possible you may even experience an increase in pain during the first 24-48 hours followed by a gradual decrease of your pain.  2. Though the procedure is generally safe and complications are rare, we do ask that you be aware of any of the following:   ? Any swelling, persistent redness, new bleeding, or drainage from the site of the injection.  ? You should not experience a severe headache.  ? You should not run a fever over 101? F.  ? New onset of sharp, severe back & or neck pain.  ? New onset of upper or lower extremity numbness or weakness.  ? New difficulty controlling bowel or bladder function after the injection.  ? New shortness of breath.    If any of these occur, please call to report this occurrence to a nurse at [phone]. If you are calling after 4:00 p.m., on weekends or holidays please call [phone] and ask to have the resident physician on call for the physician paged or go to your local emergency room.  3. You may experience soreness at the injection site. Ice can be applied at 20 minute intervals. Avoid application of direct heat, hot showers or hot tubs today.  4. Avoid strenuous activity today. You may resume your regular activities and exercise tomorrow.  5. Patients with diabetes may see an elevation in blood sugars for 7-10 days after the injection. It is important to pay close attention to your diet, check your blood sugars daily and report extreme elevations to the physician that treats your diabetes.  6. Patients taking a daily blood thinner can resume their regular dose this evening.  7. It is important that you take all medications ordered by your pain physician. Taking medication as ordered is an important part of your pain care plan. If you cannot continue the medication plan, please notify the physician.     Possible side effects to steroids that may occur:  ? Flushing or redness of the face  ? Irritability  ? Fluid retention  ? Change in women?s menses    The following medications were used: Lidocaine , Triamcinolone   and Contrast Dye

## 2019-10-08 ENCOUNTER — Encounter: Admit: 2019-10-08 | Discharge: 2019-10-08 | Payer: MEDICARE

## 2019-10-08 DIAGNOSIS — M1990 Unspecified osteoarthritis, unspecified site: Secondary | ICD-10-CM

## 2019-10-08 DIAGNOSIS — E162 Hypoglycemia, unspecified: Secondary | ICD-10-CM

## 2019-10-08 DIAGNOSIS — E785 Hyperlipidemia, unspecified: Secondary | ICD-10-CM

## 2019-10-08 DIAGNOSIS — Z853 Personal history of malignant neoplasm of breast: Secondary | ICD-10-CM

## 2019-10-08 DIAGNOSIS — C50212 Malignant neoplasm of upper-inner quadrant of left female breast: Secondary | ICD-10-CM

## 2019-10-08 DIAGNOSIS — R079 Chest pain, unspecified: Secondary | ICD-10-CM

## 2019-10-08 DIAGNOSIS — I447 Left bundle-branch block, unspecified: Secondary | ICD-10-CM

## 2019-10-08 DIAGNOSIS — R002 Palpitations: Secondary | ICD-10-CM

## 2019-10-08 DIAGNOSIS — I471 Supraventricular tachycardia: Secondary | ICD-10-CM

## 2019-10-08 DIAGNOSIS — M549 Dorsalgia, unspecified: Secondary | ICD-10-CM

## 2019-10-08 DIAGNOSIS — K219 Gastro-esophageal reflux disease without esophagitis: Secondary | ICD-10-CM

## 2019-10-08 DIAGNOSIS — F329 Major depressive disorder, single episode, unspecified: Secondary | ICD-10-CM

## 2019-10-08 DIAGNOSIS — E559 Vitamin D deficiency, unspecified: Secondary | ICD-10-CM

## 2019-10-08 DIAGNOSIS — Z9109 Other allergy status, other than to drugs and biological substances: Secondary | ICD-10-CM

## 2019-10-08 DIAGNOSIS — G629 Polyneuropathy, unspecified: Secondary | ICD-10-CM

## 2019-10-08 DIAGNOSIS — M3500 Sicca syndrome, unspecified: Secondary | ICD-10-CM

## 2019-10-08 DIAGNOSIS — M79604 Pain in right leg: Secondary | ICD-10-CM

## 2019-10-08 DIAGNOSIS — Z1379 Encounter for other screening for genetic and chromosomal anomalies: Secondary | ICD-10-CM

## 2019-10-08 DIAGNOSIS — U071 COVID-19: Secondary | ICD-10-CM

## 2019-10-08 DIAGNOSIS — M810 Age-related osteoporosis without current pathological fracture: Secondary | ICD-10-CM

## 2019-10-08 NOTE — Patient Instructions
Echo   Stress Test  Ultrasound of both legs  FLP- Fasting lab nothing to eat or drink 10-12 hrs prior to lab draw         [facility] Nuclear Stress Test Instructions    PLEASE REPORT TO:    ____[facility], [address], [place], [place]) - [phone]   ____[facility] ([address], [address], [place], [place]) - [phone]    ____[facility] ([address] [place], [place]) - [phone]    ____[facility] ([address], [place], [place]) - [phone]   ____[facility] ([address] [place], [place]) - [phone]     [facility] Main Phone Number: [phone]     Date of Test  ____________  at ____________  for ________________________    Are you able to raise your arm up by your head for about 20 minutes? Yes  Can you lie on your back for approximately 20 minutes with minimal movement? yes    The Thallium evaluation has two parts -- two nuclear scans.  The first scan is done in the morning and the second three to four hours later.   Wear comfortable clothing. Shorts or pants. (No dresses or skirts please).  Bring or wear sneakers/walking shoes if you are walking on Treadmill.  Please let the nuclear technologists know if you plan on flying after the test.    NO CAFFEINE 24 HOURS PRIOR TO TEST. Examples: coffee, tea, decaf drinks, cola, chocolate.     DO NOT EAT OR DRINK THE MORNING OF YOUR TEST unless otherwise instructed. (You may have a couple sips of water).    If you are a diabetic, if insulin dependent: please take one third of your insulin with two pieces of dry toast and a small juice). Bring insulin and medication with you to the test.     ___ TAKE MORNING MEDICATIONS WITH A COUPLE SIPS OF WATER PRIOR TO TEST.     HOLD THE FOLLOWING MEDICATIONS AS INDICATED BELOW:      ? none       WHAT TO DO BETWEEN THE FIRST TWO THALLIUM SCANS:  1. No strenuous exercise should be performed during this time.  2. A light lunch is permissible. The technologist will give you a list of appropriate foods.  3. Please return 15 minutes prior to the schedule of your second scan. Our nuclear technologist will tell you exactly what time to return.  4. Please do not use tobacco products in between scans.  5. After the first scan is completed, you may resume usual medications.     TEST FINDINGS:  You will receive the results of the test within 7 business days of its completion by telephone, unless arranged differently at the time of the procedure.  If you have any questions concerning your thallium test or if you do not hear from your CVM physician/or nurse within 7 business days, please call the appropriate office checked above.      Instructions given by Florene Route, RN

## 2019-10-08 NOTE — Progress Notes
Date of Service: 10/08/2019    Kristen Norris Pronounced Kristen Norris is a 69 y.o. female.       HPI     Kristen Norris was in the [facility] clinic today for follow-up regarding palpitations and PSVT.  I was very sorry to hear that yet another of her brothers committed suicide recently.  She is also worried about a sister out in [place] who is still recovering from a complicated case of Covid.    The patient always has some sort of weird symptoms or problem every time I see her and I still do not have a good sense about her cardiac pathology.  She wore a monitor a year ago that did show fairly frequent episodes of SVT.  We started her on beta-blocker at the time and the symptoms have seemed to decrease in frequency.    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  She is now complaining of some intermittent chest discomfort and I think we need to reevaluate for the significance of any coronary disease.    Her main complaint today has to do with bilateral leg swelling and some pain behind the left knee.  The swelling seems to have resolved fairly quickly and seemingly spontaneously after she drank some tea.  She denies any history of deep vein thrombosis but she is having a little more breathlessness recently and I remain a little concerned that she may have some degree of deep vein thrombosis.    She denies any TIA or stroke symptoms.  She has had no syncope or near syncope.       Vitals:    10/08/19 0824   BP: (!) 146/82   BP Source: Arm, Right Upper   Patient Position: Sitting   Pulse: 55   SpO2: 98%   Weight: 67.9 kg (149 lb 12.8 oz)   Height: 1.6 m (5' 3)   PainSc: Three     Body mass index is 26.54 kg/m?Marland Kitchen     Past Medical History  Patient Active Problem List    Diagnosis Date Noted   ? 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  2-week cardiac monitor shows episodes of fast SVT 3-20 seconds in duration.     ? Overactive bladder 10/31/2018   ? 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 Rheumatoid factor positive 02/23/2018   ? Arthralgia of right temporomandibular joint 02/23/2018   ? S/P hysterectomy with oophorectomy 07/15/2011   ? Pelvic pain in female 07/15/2011   ? Cystocele 07/15/2011   ? Left bundle branch block 03/17/2011   ? Preoperative evaluation to rule out surgical contraindication 03/17/2011   ? Abnormal electro-oculogram 03/17/2011   ? Ovarian cyst, left 01/05/2011   ? 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 Thoracic or lumbosacral neuritis or radiculitis, unspecified 04/18/2010   ? S/P Lumpectomy of Breast-Surgical Oncology Notes 12/17/2007     DIAGNOSIS:  Left breast Invasive Ductal Cancer diagnosed 3/07,  STAGE II-A  T2 N0 M0  Markers:  ER 100%, PR 0%, Ki67 15%, HER-2/neu negative.     PROCEDURE: Left lumpectomy with sentinel node biopsy 06/07/2005    HPI: This 69 year old postmenopausal female developed pain in the medial side of the left chest wall in 2/07.  Mammogram and ultrasound revealed left upper inner quadrant breast mass  that on biopsy revealed grade II invasive ductal carcinoma, ER 100%, PR 0%, Ki67 15%, HER-2/neu negative. She underwent left lumpectomy and sentinel lymph node sampling on 06/07/05. Lumpectomy revealed a 2.1 x 2-cm invasive ductal carcinoma, grade II. No angiolymphatic invasion. There was perineural invasion. 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 Margins were clear. 0/2 sentinel lymph nodes were involved.   Oncotype DX was sent and came back with a high recurrence score of 33, which corresponds to a 10-year rate of distant recurrence of 23% with adjuvant tamoxifen alone. For adjuvant chemotherapy, she received four cycles of dose-dense AC and 10 weeks of taxol by Dr. Osborn Coho.  . Taxol had to be stopped at 10 weeks due to grade 3 peripheral neuropathy. She was placed on adjuvant Arimidex in 10/07.  Radiation therapy was given by Dr. Joycelyn Man in [place], [place] and completed in December 2007.   She was last seen in 12/2007 with c/o left axillary burning.  On exam there was a stable scar retraction at left lumpectomy site in left UIQ. No palpable mass, no nipple or areolar change. Right breast: palpable fullness with tenderness right UIQ most consistent with prominent pectoral muscle (patient works heavy labor as gardener). Left diagnostic mammogram 11/09 was class 2. Right breast with palpable tender fullness right upper breast/chest wall, benign clinically. Right breast sonogram also negative 11/09.  Follow-up exam 03/2008 assessment:  Right breast pain- negative clinical exam as well as negative Ultrasound. Findings and complaint most consistent with musculoskeletal etiology (patient works in Aeronautical engineer).  She now returns for a 3 month f/u exam to confirm this impression.          ? GERD (gastroesophageal reflux disease)    ? Depression    ? Environmental allergies    ? PN (Peripheral Neuropathy)    ? Hypoglycemia    ? Osteoporosis      BMD  T-score (-2.3) spine and (-2.9) femur on Actonel     ? Vitamin D deficiency    ? Back pain 07/18/2007     The patient is currently experiencing pain.      Location: middle back   Intensity (1-10): 6   Description: dull   Duration: constant  Fatigue (0-10): 7     ? Pain in joint, ankle and foot 10/05/2006   ? Malignant neoplasm of other specified sites of female breast (HCC) 06/15/2006     DIAGNOSIS:  Left breast cancer, 3/07.     STAGE:  II-A;  T2N0M0, ER positive, HER-2/neu negative.     PAST THERAPY:  This 69 year old postmenopausal female developed pain in the medial side of the left chest wall in 2/07.  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 She underwent left lumpectomy and sentinel lymph node sampling on 06/07/05. Lumpectomy revealed a 2.1 x 2-cm invasive ductal carcinoma, grade II. No angiolymphatic invasion. There was perineural invasion. 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 Margins were clear. 0/2 sentinel lymph nodes were involved. Oncotype DX was sent and came back with a high recurrence score of 33, which corresponds to a 10-year rate of distant recurrence of 23% with adjuvant tamoxifen alone. For adjuvant chemotherapy, she received four cycles of dose-dense AC and 10 weeks of taxol. Taxol had to be stopped at 10 weeks due to grade 3 peripheral neuropathy. She was placed on adjuvant Arimidex in 10/07.  Radiation therapy was given in [place],  [place] and completed in December 2007.    PRESENT THERAPY:  Adjuvant Arimidex since 11/24/05.         ? Edema 02/28/2006         Review of Systems   Constitutional: Positive for malaise/fatigue.   HENT: Positive for hoarse voice and stridor.    Eyes: Negative.    Cardiovascular: Positive for dyspnea on exertion, irregular heartbeat, leg swelling and palpitations.   Respiratory: Positive for cough, shortness of breath and wheezing.    Endocrine: Positive for polydipsia and polyuria.   Hematologic/Lymphatic: Bruises/bleeds easily.   Skin: Negative.    Musculoskeletal: Positive for back pain, joint pain, myalgias and neck pain.   Gastrointestinal: Positive for diarrhea, dysphagia and hematochezia.   Genitourinary: Positive for frequency and urgency.   Neurological: Positive for headaches and light-headedness.   Psychiatric/Behavioral: Negative.    Allergic/Immunologic: Negative.        Physical Exam    Physical Exam   General Appearance: no distress   Skin: warm, no ulcers or xanthomas   Digits and Nails: no cyanosis or clubbing   Eyes: conjunctivae and lids normal, pupils are equal and round   Teeth/Gums/Palate: dentition unremarkable, no lesions   Lips & Oral Mucosa: no pallor or cyanosis   Neck Veins: normal JVP , neck veins are not distended   Thyroid: no nodules, masses, tenderness or enlargement   Chest Inspection: chest is normal in appearance   Respiratory Effort: breathing comfortably, no respiratory distress   Auscultation/Percussion: lungs clear to auscultation, no rales or rhonchi, no wheezing   PMI: PMI not enlarged or displaced   Cardiac Rhythm: regular rhythm and normal rate   Cardiac Auscultation: S1, S2 normal, no rub, no gallop   Murmurs: no murmur   Peripheral Circulation: normal peripheral circulation   Carotid Arteries: normal carotid upstroke bilaterally, no bruits   Radial Arteries: normal symmetric radial pulses   Abdominal Aorta: no abdominal aortic bruit   Pedal Pulses: normal symmetric pedal pulses   Lower Extremity Edema: no lower extremity edema   Abdominal Exam: soft, non-tender, no masses, bowel sounds normal   Liver & Spleen: no organomegaly   Gait & Station: walks without assistance   Muscle Strength: normal muscle tone   Orientation: oriented to time, place and person   Affect & Mood: appropriate and sustained affect   Language and Memory: patient responsive and seems to comprehend information   Neurologic Exam: neurological assessment grossly intact   Other: moves all extremities      Problems Addressed Today  Encounter Diagnoses   Name Primary?   ? Left bundle branch block Yes   ? PSVT (paroxysmal supraventricular tachycardia) (HCC)    ? Chest pain, unspecified type    ? Palpitations    ? Pain in both lower extremities        Assessment and Plan       PSVT (paroxysmal supraventricular tachycardia) (HCC)  We need to evaluate for structural cardiac problems again, but I suspect that we will probably want to use another long-term cardiac monitor to determine how much arrhythmia burden she has currently.    I have ordered a regadenoson thallium stress test.  She has a left bundle branch block and therefore treadmill only stress testing will not be accurate.    I have also ordered an echocardiogram to evaluate for the development of any significant left ventricular dysfunction, given her underlying LBBB.    I have also ordered bilateral venous duplex to assess  for the possibility of deep vein thrombosis as an explanation for her bilateral lower extremity edema and pain.      Current Medications (including today's revisions)  ? amitriptyline (ELAVIL) 25 mg tablet Take 25 mg by mouth at bedtime as needed.   ? aspirin EC 81 mg tablet Take 81 mg by mouth daily. Taking every 3 days   ? benzonatate (TESSALON PERLES) 100 mg capsule Take 100 mg by mouth every 8 hours as needed for Cough.   ? CALCIUM CARBONATE (CALCIUM 500 PO) Take 1 Cap by mouth Daily.   ? Cholecalciferol (Vitamin D3) (VITAMIN D) 1,000 unit cap Take 3 Caps by mouth daily.   ? cyclobenzaprine (FLEXERIL) 5 mg tablet Take one tablet by mouth three times daily as needed for Muscle Cramps.   ? flecainide (TAMBOCOR) 50 mg tablet Take one tablet by mouth twice daily.   ? fluticasone propionate (FLONASE) 50 mcg/actuation nasal spray, suspension Apply 1 spray to each nostril as directed as Needed.   ? hydrOXYchloroQUINE (PLAQUENIL) 200 mg tablet Take one tablet by mouth twice daily. Take with food.   ? methocarbamoL (ROBAXIN) 500 mg tablet Take two tablets by mouth four times daily.   ? metoprolol XL (TOPROL XL) 50 mg extended release tablet Take 1 tablet by mouth once daily   ? oxybutynin XL (DITROPAN XL) 10 mg tablet Take one tablet by mouth daily. Do not cut/ crush/ chew   ? traMADoL (ULTRAM) 50 mg tablet Take one tablet by mouth every 8 hours as needed for Pain. Indications: pain     Total time spent on today's office visit was 30 minutes.  This includes face-to-face in person visit with patient as well as nonface-to-face time including review of the EMR, outside records, labs, radiologic studies, echocardiogram & other cardiovascular studies, formation of treatment plan, after visit summary, future disposition, and lastly on documentation.

## 2019-10-09 ENCOUNTER — Encounter: Admit: 2019-10-09 | Discharge: 2019-10-09 | Payer: MEDICARE

## 2019-10-09 DIAGNOSIS — R079 Chest pain, unspecified: Secondary | ICD-10-CM

## 2019-10-09 DIAGNOSIS — R002 Palpitations: Secondary | ICD-10-CM

## 2019-10-09 DIAGNOSIS — I471 Supraventricular tachycardia: Secondary | ICD-10-CM

## 2019-10-09 DIAGNOSIS — I447 Left bundle-branch block, unspecified: Secondary | ICD-10-CM

## 2019-10-09 DIAGNOSIS — M79604 Pain in right leg: Secondary | ICD-10-CM

## 2019-10-12 ENCOUNTER — Encounter: Admit: 2019-10-12 | Discharge: 2019-10-12 | Payer: MEDICARE

## 2019-10-12 MED ORDER — FLECAINIDE 50 MG PO TAB
ORAL_TABLET | Freq: Two times a day (BID) | 0 refills
Start: 2019-10-12 — End: ?

## 2019-10-15 ENCOUNTER — Encounter: Admit: 2019-10-15 | Discharge: 2019-10-15 | Payer: MEDICARE

## 2019-10-15 ENCOUNTER — Ambulatory Visit: Admit: 2019-10-15 | Discharge: 2019-10-15 | Payer: MEDICARE

## 2019-10-15 DIAGNOSIS — M79605 Pain in left leg: Secondary | ICD-10-CM

## 2019-10-15 DIAGNOSIS — I471 Supraventricular tachycardia: Secondary | ICD-10-CM

## 2019-10-15 DIAGNOSIS — M79604 Pain in right leg: Secondary | ICD-10-CM

## 2019-10-15 DIAGNOSIS — I447 Left bundle-branch block, unspecified: Secondary | ICD-10-CM

## 2019-10-15 DIAGNOSIS — R002 Palpitations: Secondary | ICD-10-CM

## 2019-10-15 DIAGNOSIS — R079 Chest pain, unspecified: Secondary | ICD-10-CM

## 2019-10-15 NOTE — Telephone Encounter
Results and recommendations called to patient. Patient has no questions that this time.

## 2019-10-15 NOTE — Telephone Encounter
-----   Message from Vanice Sarah, MD sent at 10/15/2019  4:04 PM CDT -----  [facility] Nursing, can you please let her know that this looked fine?    Cc:  Dr. Olene Floss

## 2019-10-17 ENCOUNTER — Encounter: Admit: 2019-10-17 | Discharge: 2019-10-17 | Payer: MEDICARE

## 2019-10-22 ENCOUNTER — Encounter: Admit: 2019-10-22 | Discharge: 2019-10-22 | Payer: MEDICARE

## 2019-10-22 NOTE — Telephone Encounter
Discussed results of stress test with patient. Patient does not have any further questions or concerns at this time.

## 2019-10-22 NOTE — Telephone Encounter
-----   Message from Vanice Sarah, MD sent at 10/22/2019 12:43 PM CDT -----  Jeanell Sparrow Nursing, can you please let Arlette know that this looks fine for her?    Cc:  Leodis Sias

## 2019-11-04 ENCOUNTER — Encounter: Admit: 2019-11-04 | Discharge: 2019-11-04 | Payer: MEDICARE

## 2019-11-05 ENCOUNTER — Encounter: Admit: 2019-11-05 | Discharge: 2019-11-05 | Payer: MEDICARE

## 2019-11-05 DIAGNOSIS — E785 Hyperlipidemia, unspecified: Secondary | ICD-10-CM

## 2019-11-05 MED ORDER — ATORVASTATIN 20 MG PO TAB
20 mg | ORAL_TABLET | Freq: Every day | ORAL | 3 refills | Status: AC
Start: 2019-11-05 — End: ?

## 2019-11-05 NOTE — Telephone Encounter
Called and discussed results and recommendations with patient. Patient is agreeable to care plan and has no questions at this time.

## 2019-11-05 NOTE — Telephone Encounter
-----   Message from Vanice Sarah, MD sent at 11/05/2019  5:58 AM CDT -----  Would she be willing to start atorva 20 mg/day with f/up lipid profile, AST, and ALT in 6 weeks?  ----- Message -----  From: Florene Route, RN  Sent: 10/09/2019  10:58 AM CDT  To: Vanice Sarah, MD    Labs for your review and recommendations. Patient is not currently on a statin. Thanks!

## 2019-11-14 ENCOUNTER — Ambulatory Visit: Admit: 2019-11-14 | Discharge: 2019-11-14 | Payer: MEDICARE

## 2019-11-14 ENCOUNTER — Encounter: Admit: 2019-11-14 | Discharge: 2019-11-14 | Payer: MEDICARE

## 2019-11-14 DIAGNOSIS — E162 Hypoglycemia, unspecified: Secondary | ICD-10-CM

## 2019-11-14 DIAGNOSIS — M810 Age-related osteoporosis without current pathological fracture: Secondary | ICD-10-CM

## 2019-11-14 DIAGNOSIS — U071 COVID-19: Secondary | ICD-10-CM

## 2019-11-14 DIAGNOSIS — Z853 Personal history of malignant neoplasm of breast: Secondary | ICD-10-CM

## 2019-11-14 DIAGNOSIS — E782 Mixed hyperlipidemia: Secondary | ICD-10-CM

## 2019-11-14 DIAGNOSIS — E559 Vitamin D deficiency, unspecified: Secondary | ICD-10-CM

## 2019-11-14 DIAGNOSIS — Z9109 Other allergy status, other than to drugs and biological substances: Secondary | ICD-10-CM

## 2019-11-14 DIAGNOSIS — M3500 Sicca syndrome, unspecified: Secondary | ICD-10-CM

## 2019-11-14 DIAGNOSIS — C50212 Malignant neoplasm of upper-inner quadrant of left female breast: Secondary | ICD-10-CM

## 2019-11-14 DIAGNOSIS — Z1379 Encounter for other screening for genetic and chromosomal anomalies: Secondary | ICD-10-CM

## 2019-11-14 DIAGNOSIS — M1990 Unspecified osteoarthritis, unspecified site: Secondary | ICD-10-CM

## 2019-11-14 DIAGNOSIS — M549 Dorsalgia, unspecified: Secondary | ICD-10-CM

## 2019-11-14 DIAGNOSIS — K219 Gastro-esophageal reflux disease without esophagitis: Secondary | ICD-10-CM

## 2019-11-14 DIAGNOSIS — F32A Depression: Secondary | ICD-10-CM

## 2019-11-14 DIAGNOSIS — G629 Polyneuropathy, unspecified: Secondary | ICD-10-CM

## 2019-11-14 DIAGNOSIS — E785 Hyperlipidemia, unspecified: Secondary | ICD-10-CM

## 2019-11-14 NOTE — Progress Notes
Kristen Norris  is a 69 year old- Caucasian female here for a follow-up visit    The patient is referred by Dr. Olene Floss    Chief complaint: Management of seronegative Sjogren's syndrome      Interval history  Last seen in June at which time we recommended Reclast, she had stopped Fosamax due to GI side effects and reported resolution of the symptoms after she stopped, she proceeded with infusion in June without any issues, she has been dealing with chronic low back pain for which she received epidural injections by another provider, she is scheduled to have one in the near future, overall she reports intermittent pain involving her hands and knees  She started physical therapy with significant improvement of her musculoskeletal pain, she is very pleased with that, she has been compliant with hydroxychloroquine and recalls having an eye examination earlier this year but she is not sure what month,  No other complaints      Current medication list:  Reviewed      Physical Examination:    VITAL SIGNS: BP 122/64 (BP Source: Arm, Right Upper, Patient Position: Sitting)  - Pulse 61  - Temp 36.4 ?C (97.5 ?F) (Temporal)  - Resp 18  - Ht 158.8 cm (62.5)  - Wt 66.2 kg (146 lb)  - LMP  (LMP Unknown)  - SpO2 97%  - BMI 26.28 kg/m?      GENERAL APPEARANCE: The patient is a well-nourished adult in no acute distress    EYES: No scleral erythema or conjunctival injection.    MUSCULOSKELETAL: The joints of the upper and lower extremities have full range of motion, no tenderness, swelling, deformity   Mild tenderness over some of the MCP without clear swelling except the right second MCP    Schirmer's test  OS 3 mm  OD 4 mm     Unstimulated salivary flow rate= 0.16 ml/min                                   Assessment and plan:    1.  Primary Sjogren's syndrome, she fulfills 2016 classification criteria based on the presence of sicca syndrome, possibly related myalgia and arthralgia, objective evidence of dry eyes and dry mouth, positive ANA and rheumatoid factor and a positive lip biopsy with focus score of 2  She started hydroxychloroquine in August 2020 with some relief, she will continue the same therapy  Activity labs were unremarkable in January 2021    2. 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 Long term Hydroxychloroquine use since mid 2020, last eye exam this year, the patient is not sure about the exact date  5. Breast cancer: Diagnosed in 2007, in remission since       Plan:  Continue hydroxychloroquine  Continue annual reclass infusion    I will see her back in about 4 months    Leverne Humbles, MD    Because this dictation was prepared with voice recognition software, there remains a potential for typographical errors or incorrect word choices by the system. We apologize for any inadvertent inconvenience from such an error.

## 2019-11-14 NOTE — Patient Instructions
Follow up in 4 months 

## 2019-12-02 ENCOUNTER — Encounter: Admit: 2019-12-02 | Discharge: 2019-12-02 | Payer: MEDICARE

## 2019-12-04 ENCOUNTER — Encounter: Admit: 2019-12-04 | Discharge: 2019-12-04 | Payer: MEDICARE

## 2019-12-04 ENCOUNTER — Ambulatory Visit: Admit: 2019-12-04 | Discharge: 2019-12-04 | Payer: MEDICARE

## 2019-12-04 DIAGNOSIS — K219 Gastro-esophageal reflux disease without esophagitis: Secondary | ICD-10-CM

## 2019-12-04 DIAGNOSIS — C50212 Malignant neoplasm of upper-inner quadrant of left female breast: Secondary | ICD-10-CM

## 2019-12-04 DIAGNOSIS — E162 Hypoglycemia, unspecified: Secondary | ICD-10-CM

## 2019-12-04 DIAGNOSIS — M4316 Spondylolisthesis, lumbar region: Secondary | ICD-10-CM

## 2019-12-04 DIAGNOSIS — M810 Age-related osteoporosis without current pathological fracture: Secondary | ICD-10-CM

## 2019-12-04 DIAGNOSIS — G629 Polyneuropathy, unspecified: Secondary | ICD-10-CM

## 2019-12-04 DIAGNOSIS — E782 Mixed hyperlipidemia: Secondary | ICD-10-CM

## 2019-12-04 DIAGNOSIS — F32A Depression: Secondary | ICD-10-CM

## 2019-12-04 DIAGNOSIS — Z1379 Encounter for other screening for genetic and chromosomal anomalies: Secondary | ICD-10-CM

## 2019-12-04 DIAGNOSIS — M549 Dorsalgia, unspecified: Secondary | ICD-10-CM

## 2019-12-04 DIAGNOSIS — U071 COVID-19: Secondary | ICD-10-CM

## 2019-12-04 DIAGNOSIS — E559 Vitamin D deficiency, unspecified: Secondary | ICD-10-CM

## 2019-12-04 DIAGNOSIS — E785 Hyperlipidemia, unspecified: Secondary | ICD-10-CM

## 2019-12-04 DIAGNOSIS — M533 Sacrococcygeal disorders, not elsewhere classified: Secondary | ICD-10-CM

## 2019-12-04 DIAGNOSIS — M1990 Unspecified osteoarthritis, unspecified site: Secondary | ICD-10-CM

## 2019-12-04 DIAGNOSIS — Z853 Personal history of malignant neoplasm of breast: Secondary | ICD-10-CM

## 2019-12-04 DIAGNOSIS — Z9109 Other allergy status, other than to drugs and biological substances: Secondary | ICD-10-CM

## 2019-12-04 DIAGNOSIS — M47816 Spondylosis without myelopathy or radiculopathy, lumbar region: Secondary | ICD-10-CM

## 2019-12-04 DIAGNOSIS — M3500 Sicca syndrome, unspecified: Secondary | ICD-10-CM

## 2019-12-04 MED ORDER — GABAPENTIN 300 MG PO CAP
ORAL_CAPSULE | Freq: Two times a day (BID) | 3 refills | Status: AC
Start: 2019-12-04 — End: ?

## 2019-12-25 ENCOUNTER — Encounter: Admit: 2019-12-25 | Discharge: 2019-12-25 | Payer: MEDICARE

## 2019-12-25 DIAGNOSIS — M533 Sacrococcygeal disorders, not elsewhere classified: Secondary | ICD-10-CM

## 2019-12-26 ENCOUNTER — Encounter: Admit: 2019-12-26 | Discharge: 2019-12-26 | Payer: MEDICARE

## 2019-12-26 ENCOUNTER — Ambulatory Visit: Admit: 2019-12-26 | Discharge: 2019-12-26 | Payer: MEDICARE

## 2019-12-26 DIAGNOSIS — M4316 Spondylolisthesis, lumbar region: Secondary | ICD-10-CM

## 2019-12-26 DIAGNOSIS — M47816 Spondylosis without myelopathy or radiculopathy, lumbar region: Secondary | ICD-10-CM

## 2019-12-26 DIAGNOSIS — M533 Sacrococcygeal disorders, not elsewhere classified: Secondary | ICD-10-CM

## 2019-12-26 MED ORDER — METHYLPREDNISOLONE ACETATE 80 MG/ML IJ SUSP
80 mg | Freq: Once | INTRAMUSCULAR | 0 refills | Status: CP
Start: 2019-12-26 — End: ?
  Administered 2019-12-26: 16:00:00 80 mg via INTRAMUSCULAR

## 2019-12-26 MED ORDER — IOHEXOL 240 MG IODINE/ML IV SOLN
2.5 mL | Freq: Once | EPIDURAL | 0 refills | Status: CP
Start: 2019-12-26 — End: ?
  Administered 2019-12-26: 16:00:00 2.5 mL via EPIDURAL

## 2019-12-26 NOTE — Progress Notes
[facility]  INTERVENTIONAL PAIN PROCEDURE HISTORY AND PHYSICAL    Chief Complaint   Patient presents with   ? Lower Back - Pain       HISTORY OF PRESENT ILLNESS:  pain    Medical History:   Diagnosis Date   ? Arthritis    ? Back pain 07/18/2007   ? Chest pain    ? COVID-19 12/28/2018   ? Depression    ? Elevated triglycerides with high cholesterol    ? Environmental allergies    ? 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 Hereditary Cancer Update Test - [place])   ? GERD (gastroesophageal reflux disease)    ? Hyperlipemia    ? Hypoglycemia    ? 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 Neuropathy    ? Osteoporosis 2/09    BMD  T-score (-2.3) spine and (-2.9) femur on Actonel   ? Personal history of malignant neoplasm of breast    ? PN (Peripheral Neuropathy)    ? Sjogren's disease (HCC)    ? Vitamin D deficiency 6/09       Surgical History:   Procedure Laterality Date   ? CARDIAC CATHERIZATION  03/28/2005    LAD 20%, Circ ostial 30%, EF 65%   ? HX BREAST LUMPECTOMY  06/07/05    Left with SLNBX (0/2)   ? COLONOSCOPY N/A 03/01/2010    normal ([name], [facility])   ? HX HYSTERECTOMY  03/24/2011    [name], MD [facility]/BSO   ? HX BLADDER SUSPENSION     ? HX TUBAL LIGATION         family history includes Brain Tumor (age of onset: 102) in an other family member; COPD in her daughter; Essie Christine (age of onset: 76) in an other family member; Physiological scientist (age of onset: 35) in an other family member; Physiological scientist (age of onset: 39) in her paternal aunt; Cancer-Breast (age of onset: 2) in an other family member; Cancer-Hematologic (age of onset: 12) in her daughter; Teresa Coombs (age of onset: 39) in her paternal uncle; Cancer-Lung (age of onset: 12) in an other family member; Cancer-Ovarian (age of onset: 11) in an other family member; Complete Hysterectomy in her sister; Complete Hysterectomy (age of onset: 85) in her daughter; Coronary Artery Disease in her father; Depression (age of onset: 73) in her brother; Diabetes Type II in her maternal grandmother; Heart Attack (age of onset: 66) in her brother; Heart Disease in her father, mother, and sister; Heart murmur in her sister; Hypoglycemia in her mother; None Reported in her grandchild, grandchild, grandchild, grandchild, grandchild, sister, and another family member; Osteopenia in her sister; Other in her grandchild; Other (age of onset: 0) in her brother; Tobacco disorder in her daughter.    Social History     Socioeconomic History   ? Marital status: Divorced     Spouse name: Not on file   ? Number of children: 2   ? Years of education: Not on file   ? Highest education level: GED or equivalent   Occupational History   ? Occupation: grounds Human resources officer: [name]   Tobacco Use   ? Smoking status: Former Smoker     Packs/day: 1.00     Years: 14.00     Pack years: 14.00  Types: Cigarettes     Quit date: 04/07/2000     Years since quitting: 19.7   ? Smokeless tobacco: Never Used   Vaping Use   ? Vaping Use: Never used   Substance and Sexual Activity   ? Alcohol use: Not Currently   ? Drug use: No   ? Sexual activity: Never   Other Topics Concern   ? Not on file   Social History Narrative   ? Not on file       Allergies   Allergen Reactions   ? Sulfa (Sulfonamide Antibiotics) RASH and NAUSEA AND VOMITING     Allergy recorded in SMS: Sulfa       Vitals:    12/26/19 0906   BP: (!) 144/83   BP Source: Arm, Right Upper   Patient Position: Sitting   Pulse: 70   Resp: 18   Temp: 36.4 ?C (97.6 ?F)   TempSrc: Oral   SpO2: 99%   Weight: 68 kg (150 lb)   Height: 160 cm (63)   PainSc: Six       REVIEW OF SYSTEMS: 10 point ROS obtained and negative except pain      PHYSICAL EXAM:unchanged        IMPRESSION:    1. Sacroiliac pain         PLAN: Other left SI joint

## 2019-12-26 NOTE — Procedures
Attending Surgeon: Rushie Nyhan, MD    Anesthesia: Local    Pre-Procedure Diagnosis:   1. Sacroiliac pain        Post-Procedure Diagnosis:   1. Sacroiliac pain        [facility] SPINE SI JOINT INJECT ANESTH/STEROID PROC  Location: sacroiliac joint L sacroiliac joint11/18/2021 9:30 AM    Consent:   Consent obtained: written  Consent given by: patient  Risks discussed: bleeding, infection, nerve damage, pain and soft tissue reaction  Alternatives discussed: alternative treatment, delayed treatment, no treatment and referral     Universal Protocol:  Relevant documents: relevant documents present and verified  Test results: test results available and properly labeled  Imaging studies: imaging studies available  Required items: required blood products, implants, devices, and special equipment available  Site marked: the operative site was marked  Patient identity confirmed: Patient identify confirmed verbally with patient.        Time out: Immediately prior to procedure a "time out" was called to verify the correct patient, procedure, equipment, support staff and site/side marked as required      Procedures Details:   Indications: pain and diagnostic evaluation   Prep: povidone-iodine  Guidance: fluoroscopy  Needle size: 22 G  Approach: posterior  Aspirate: clear  Patient tolerance: Patient tolerated the procedure well with no immediate complications. Pressure was applied, and hemostasis was accomplished.  Comments: 26ml lido and 80mg  depomedrol          Estimated blood loss: none or minimal  Specimens: none  Patient tolerated the procedure well with no immediate complications. Pressure was applied, and hemostasis was accomplished.

## 2019-12-26 NOTE — Patient Instructions
Procedure Completed Today: Other si joint    Important information following your procedure today: You may drive today    1. Pain relief may not be immediate. It is possible you may even experience an increase in pain during the first 24-48 hours followed by a gradual decrease of your pain.  2. Though the procedure is generally safe and complications are rare, we do ask that you be aware of any of the following:   ? Any swelling, persistent redness, new bleeding, or drainage from the site of the injection.  ? You should not experience a severe headache.  ? You should not run a fever over 101? F.  ? New onset of sharp, severe back & or neck pain.  ? New onset of upper or lower extremity numbness or weakness.  ? New difficulty controlling bowel or bladder function after the injection.  ? New shortness of breath.    If any of these occur, please call to report this occurrence to a nurse at [phone]. If you are calling after 4:00 p.m., on weekends or holidays please call [phone] and ask to have the resident physician on call for the physician paged or go to your local emergency room.  3. You may experience soreness at the injection site. Ice can be applied at 20 minute intervals. Avoid application of direct heat, hot showers or hot tubs today.  4. Avoid strenuous activity today. You may resume your regular activities and exercise tomorrow.  5. Patients with diabetes may see an elevation in blood sugars for 7-10 days after the injection. It is important to pay close attention to your diet, check your blood sugars daily and report extreme elevations to the physician that treats your diabetes.  6. Patients taking a daily blood thinner can resume their regular dose this evening.  7. It is important that you take all medications ordered by your pain physician. Taking medication as ordered is an important part of your pain care plan. If you cannot continue the medication plan, please notify the physician. Possible side effects to steroids that may occur:  ? Flushing or redness of the face  ? Irritability  ? Fluid retention  ? Change in women?s menses    The following medications were used: Lidocaine , Depomedrol and Contrast Dye

## 2019-12-30 ENCOUNTER — Encounter: Admit: 2019-12-30 | Discharge: 2019-12-30 | Payer: MEDICARE

## 2020-01-17 ENCOUNTER — Encounter: Admit: 2020-01-17 | Discharge: 2020-01-17 | Payer: MEDICARE

## 2020-01-21 ENCOUNTER — Encounter: Admit: 2020-01-21 | Discharge: 2020-01-21 | Payer: MEDICARE

## 2020-01-21 DIAGNOSIS — M3500 Sicca syndrome, unspecified: Secondary | ICD-10-CM

## 2020-01-21 DIAGNOSIS — E785 Hyperlipidemia, unspecified: Secondary | ICD-10-CM

## 2020-01-21 DIAGNOSIS — G909 Disorder of the autonomic nervous system, unspecified: Secondary | ICD-10-CM

## 2020-01-21 DIAGNOSIS — I447 Left bundle-branch block, unspecified: Secondary | ICD-10-CM

## 2020-01-21 DIAGNOSIS — G629 Polyneuropathy, unspecified: Secondary | ICD-10-CM

## 2020-01-21 DIAGNOSIS — M1990 Unspecified osteoarthritis, unspecified site: Secondary | ICD-10-CM

## 2020-01-21 DIAGNOSIS — E782 Mixed hyperlipidemia: Secondary | ICD-10-CM

## 2020-01-21 DIAGNOSIS — Z1379 Encounter for other screening for genetic and chromosomal anomalies: Secondary | ICD-10-CM

## 2020-01-21 DIAGNOSIS — E559 Vitamin D deficiency, unspecified: Secondary | ICD-10-CM

## 2020-01-21 DIAGNOSIS — U071 COVID-19: Secondary | ICD-10-CM

## 2020-01-21 DIAGNOSIS — K219 Gastro-esophageal reflux disease without esophagitis: Secondary | ICD-10-CM

## 2020-01-21 DIAGNOSIS — C50212 Malignant neoplasm of upper-inner quadrant of left female breast: Secondary | ICD-10-CM

## 2020-01-21 DIAGNOSIS — E162 Hypoglycemia, unspecified: Secondary | ICD-10-CM

## 2020-01-21 DIAGNOSIS — M810 Age-related osteoporosis without current pathological fracture: Secondary | ICD-10-CM

## 2020-01-21 DIAGNOSIS — M549 Dorsalgia, unspecified: Secondary | ICD-10-CM

## 2020-01-21 DIAGNOSIS — I471 Supraventricular tachycardia: Secondary | ICD-10-CM

## 2020-01-21 DIAGNOSIS — Z853 Personal history of malignant neoplasm of breast: Secondary | ICD-10-CM

## 2020-01-21 DIAGNOSIS — Z9109 Other allergy status, other than to drugs and biological substances: Secondary | ICD-10-CM

## 2020-01-21 DIAGNOSIS — F32A Depression: Secondary | ICD-10-CM

## 2020-01-21 NOTE — Assessment & Plan Note
We did both echocardiography and stress testing a few months ago and there is no evidence for underlying structural heart disease.

## 2020-01-21 NOTE — Progress Notes
Date of Service: 01/21/2020    Kristen Norris L Pronounced Kristen Norris is a 69 y.o. female.       HPI     Kristen Norris was in the [place] clinic today for follow-up regarding SVT.  She says that she is doing much better than when I last saw her.  She is having only rare episodes of palpitations that last a few seconds.    She gets tired toward the end of the day.  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    She isn't having chest discomfort.  She still has bilateral leg swelling, but is using compression stockings fairly regularly now.         Vitals:    01/21/20 1416   BP: 108/64   BP Source: Arm, Right Upper   Patient Position: Sitting   Pulse: 70   SpO2: 96%   Weight: 68.2 kg (150 lb 6.4 oz)   Height: 1.6 m (5' 3)   PainSc: Five     Body mass index is 26.64 kg/m?Kristen Norris     Past Medical History  Patient Active Problem List    Diagnosis Date Noted   ? 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  2-week cardiac monitor shows episodes of fast SVT 3-20 seconds in duration.     ? Overactive bladder 10/31/2018   ? 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 Rheumatoid factor positive 02/23/2018   ? Arthralgia of right temporomandibular joint 02/23/2018   ? S/P hysterectomy with oophorectomy 07/15/2011   ? Pelvic pain in female 07/15/2011   ? Cystocele 07/15/2011   ? Left bundle branch block 03/17/2011   ? Preoperative evaluation to rule out surgical contraindication 03/17/2011   ? Abnormal electro-oculogram 03/17/2011   ? Ovarian cyst, left 01/05/2011   ? 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 Thoracic or lumbosacral neuritis or radiculitis, unspecified 04/18/2010   ? S/P Lumpectomy of Breast-Surgical Oncology Notes 12/17/2007     DIAGNOSIS:  Left breast Invasive Ductal Cancer diagnosed 3/07,  STAGE II-A  T2 N0 M0  Markers:  ER 100%, PR 0%, Ki67 15%, HER-2/neu negative.     PROCEDURE: Left lumpectomy with sentinel node biopsy 06/07/2005    HPI: This 69 year old postmenopausal female developed pain in the medial side of the left chest wall in 2/07.  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 She underwent left lumpectomy and sentinel lymph node sampling on 06/07/05. Lumpectomy revealed a 2.1 x 2-cm invasive ductal carcinoma, grade II. No angiolymphatic invasion. There was perineural invasion. 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 Margins were clear. 0/2 sentinel lymph nodes were involved.   Oncotype DX was sent and came back with a high recurrence score of 33, which corresponds to a 10-year rate of distant recurrence of 23% with adjuvant tamoxifen alone. For adjuvant chemotherapy, she received four cycles of dose-dense AC and 10 weeks of taxol by Dr. Osborn Coho.  . Taxol had to be stopped at 10 weeks due to grade 3 peripheral neuropathy. She was placed on adjuvant Arimidex in 10/07.  Radiation therapy was given by Dr. Joycelyn Man in [place], [place] and completed in December 2007.   She was last seen in 12/2007 with c/o left axillary burning.  On  exam there was a stable scar retraction at left lumpectomy site in left UIQ. No palpable mass, no nipple or areolar change. Right breast: palpable fullness with tenderness right UIQ most consistent with prominent pectoral muscle (patient works heavy labor as gardener). Left diagnostic mammogram 11/09 was class 2. Right breast with palpable tender fullness right upper breast/chest wall, benign clinically. Right breast sonogram also negative 11/09.  Follow-up exam 03/2008 assessment:  Right breast pain- negative clinical exam as well as negative Ultrasound. Findings and complaint most consistent with musculoskeletal etiology (patient works in Aeronautical [employer]).  She now returns for a 3 month f/u exam to confirm this impression.          ? GERD (gastroesophageal reflux disease)    ? Depression    ? Environmental allergies    ? PN (Peripheral Neuropathy)    ? Hypoglycemia    ? Osteoporosis      BMD  T-score (-2.3) spine and (-2.9) femur on Actonel     ? Vitamin D deficiency    ? Back pain 07/18/2007     The patient is currently experiencing pain.      Location: middle back   Intensity (1-10): 6   Description: dull   Duration: constant  Fatigue (0-10): 7     ? Pain in joint, ankle and foot 10/05/2006   ? Malignant neoplasm of other specified sites of female breast 06/15/2006     DIAGNOSIS:  Left breast cancer, 3/07.     STAGE:  II-A;  T2N0M0, ER positive, HER-2/neu negative.     PAST THERAPY:  This 69 year old postmenopausal female developed pain in the medial side of the left chest wall in 2/07.  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 She underwent left lumpectomy and sentinel lymph node sampling on 06/07/05. Lumpectomy revealed a 2.1 x 2-cm invasive ductal carcinoma, grade II. No angiolymphatic invasion. There was perineural invasion. 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 Margins were clear. 0/2 sentinel lymph nodes were involved. Oncotype DX was sent and came back with a high recurrence score of 33, which corresponds to a 10-year rate of distant recurrence of 23% with adjuvant tamoxifen alone. For adjuvant chemotherapy, she received four cycles of dose-dense AC and 10 weeks of taxol. Taxol had to be stopped at 10 weeks due to grade 3 peripheral neuropathy. She was placed on adjuvant Arimidex in 10/07.  Radiation therapy was given in [place], [place] and completed in December 2007.    PRESENT THERAPY:  Adjuvant Arimidex since 11/24/05.         ? Edema 02/28/2006         Review of Systems   Constitutional: Positive for malaise/fatigue.   HENT: Negative.    Eyes: Positive for photophobia and redness.   Cardiovascular: Positive for palpitations.   Respiratory: Negative.    Endocrine: Positive for polyuria.   Hematologic/Lymphatic: Bruises/bleeds easily.   Skin: Positive for dry skin.   Musculoskeletal: Positive for arthritis, back pain, joint pain, joint swelling and myalgias.   Gastrointestinal: Negative.    Genitourinary: Positive for bladder incontinence and dysuria.   Neurological: Negative.    Psychiatric/Behavioral: Negative.    Allergic/Immunologic: Positive for environmental allergies.       Physical Exam    Physical Exam   General Appearance: no distress   Skin: warm, no ulcers or xanthomas   Digits and Nails: no cyanosis or clubbing  Eyes: conjunctivae and lids normal, pupils are equal and round   Teeth/Gums/Palate: dentition unremarkable, no lesions   Lips & Oral Mucosa: no pallor or cyanosis   Neck Veins: normal JVP , neck veins are not distended   Thyroid: no nodules, masses, tenderness or enlargement   Chest Inspection: chest is normal in appearance   Respiratory Effort: breathing comfortably, no respiratory distress   Auscultation/Percussion: lungs clear to auscultation, no rales or rhonchi, no wheezing   PMI: PMI not enlarged or displaced   Cardiac Rhythm: regular rhythm and normal rate   Cardiac Auscultation: S1, S2 normal, no rub, no gallop   Murmurs: no murmur   Peripheral Circulation: normal peripheral circulation   Carotid Arteries: normal carotid upstroke bilaterally, no bruits   Radial Arteries: normal symmetric radial pulses   Abdominal Aorta: no abdominal aortic bruit   Pedal Pulses: normal symmetric pedal pulses   Lower Extremity Edema: no lower extremity edema   Abdominal Exam: soft, non-tender, no masses, bowel sounds normal   Liver & Spleen: no organomegaly   Gait & Station: walks without assistance   Muscle Strength: normal muscle tone   Orientation: oriented to time, place and person   Affect & Mood: appropriate and sustained affect   Language and Memory: patient responsive and seems to comprehend information   Neurologic Exam: neurological assessment grossly intact   Other: moves all extremities      Cardiovascular Health Factors  Vitals BP Readings from Last 3 Encounters:   01/21/20 108/64   12/26/19 136/70   12/04/19 137/78     Wt Readings from Last 3 Encounters:   01/21/20 68.2 kg (150 lb 6.4 oz)   12/26/19 68 kg (150 lb)   12/04/19 66.2 kg (146 lb)     BMI Readings from Last 3 Encounters:   01/21/20 26.64 kg/m?   12/26/19 26.57 kg/m?   12/04/19 26.70 kg/m?      Smoking Social History     Tobacco Use   Smoking Status Former Smoker   ? Packs/day: 1.00   ? Years: 14.00   ? Pack years: 14.00   ? Types: Cigarettes   ? Quit date: 04/07/2000   ? Years since quitting: 19.8   Smokeless Tobacco Never Used      Lipid Profile Cholesterol   Date Value Ref Range Status   12/24/2019 150  Final     HDL   Date Value Ref Range Status   12/24/2019 42  Final     LDL   Date Value Ref Range Status   12/24/2019 67  Final     Triglycerides   Date Value Ref Range Status   12/24/2019 207 (H) <150 Final      Blood Sugar No results found for: HGBA1C  Glucose   Date Value Ref Range Status   10/30/2019 104  Final   02/21/2019 99 70 - 100 MG/DL Final   16/11/9602 540 (H) 70 - 100 MG/DL Final   98/12/9145 98 70 - 110 MG/DL Final   82/95/6213 086 70 - 110 MG/DL Final   57/84/6962 94 70 - 110 MG/DL Final     Glucose, POC   Date Value Ref Range Status   03/24/2011 117 (H) 70 - 100 MG/DL Final          Problems Addressed Today  Encounter Diagnoses   Name Primary?   ? PSVT (paroxysmal supraventricular tachycardia) (HCC)    ? Autonomic dysfunction    ? Left bundle branch block  Assessment and Plan       PSVT (paroxysmal supraventricular tachycardia) (HCC)  She is doing well on the combination of metoprolol and flecainide.    Autonomic dysfunction  She is not having any problems with lightheadedness or syncope currently.    Left bundle branch block  We did both echocardiography and stress testing a few months ago and there is no evidence for underlying structural heart disease.      Current Medications (including today's revisions)  ? amitriptyline (ELAVIL) 25 mg tablet Take 25 mg by mouth at bedtime as needed.   ? aspirin EC 81 mg tablet Take 81 mg by mouth daily. Taking every 3 days   ? atorvastatin (LIPITOR) 20 mg tablet Take one tablet by mouth daily.   ? benzonatate (TESSALON PERLES) 100 mg capsule Take 100 mg by mouth every 8 hours as needed for Cough.   ? CALCIUM CARBONATE (CALCIUM 500 PO) Take 1 Cap by mouth Daily.   ? Cholecalciferol (Vitamin D3) (VITAMIN D) 1,000 unit cap Take 3 Caps by mouth daily.   ? cyclobenzaprine (FLEXERIL) 5 mg tablet Take one tablet by mouth three times daily as needed for Muscle Cramps.   ? flecainide (TAMBOCOR) 50 mg tablet Take 1 tablet by mouth twice daily   ? gabapentin (NEURONTIN) 300 mg capsule 1 po qhs x 1 week then 1 po bid x 1 week then 1 po tid thereafter   ? hydrOXYchloroQUINE (PLAQUENIL) 200 mg tablet Take one tablet by mouth twice daily. Take with food.   ? methocarbamoL (ROBAXIN) 500 mg tablet Take two tablets by mouth four times daily.   ? metoprolol XL (TOPROL XL) 50 mg extended release tablet Take 1 tablet by mouth once daily   ? oxybutynin XL (DITROPAN XL) 10 mg tablet Take one tablet by mouth daily. Do not cut/ crush/ chew   ? traMADoL (ULTRAM) 50 mg tablet Take one tablet by mouth every 8 hours as needed for Pain. Indications: pain     Total time spent on today's office visit was 30 minutes.  This includes face-to-face in person visit with patient as well as nonface-to-face time including review of the EMR, outside records, labs, radiologic studies, echocardiogram & other cardiovascular studies, formation of treatment plan, after visit summary, future disposition, and lastly on documentation.

## 2020-01-21 NOTE — Assessment & Plan Note
She is not having any problems with lightheadedness or syncope currently.

## 2020-01-21 NOTE — Assessment & Plan Note
She is doing well on the combination of metoprolol and flecainide.

## 2020-02-03 ENCOUNTER — Encounter: Admit: 2020-02-03 | Discharge: 2020-02-03 | Payer: MEDICARE

## 2020-02-19 ENCOUNTER — Encounter: Admit: 2020-02-19 | Discharge: 2020-02-19 | Payer: MEDICARE

## 2020-03-17 ENCOUNTER — Encounter: Admit: 2020-03-17 | Discharge: 2020-03-17 | Payer: MEDICARE

## 2020-03-17 NOTE — Telephone Encounter
Pt called, she has been diagnosed with shingles and put on acyclovir, and wanted to make sure it was safe to take with hcq, advised yes. Advised she cont to follow up with her PCP.     She stated understanding and denied any additional needs.

## 2020-04-01 ENCOUNTER — Encounter: Admit: 2020-04-01 | Discharge: 2020-04-01 | Payer: MEDICARE

## 2020-04-01 ENCOUNTER — Ambulatory Visit: Admit: 2020-04-01 | Discharge: 2020-04-01 | Payer: MEDICARE

## 2020-04-01 DIAGNOSIS — C50212 Malignant neoplasm of upper-inner quadrant of left female breast: Secondary | ICD-10-CM

## 2020-04-01 DIAGNOSIS — M810 Age-related osteoporosis without current pathological fracture: Secondary | ICD-10-CM

## 2020-04-01 DIAGNOSIS — M549 Dorsalgia, unspecified: Secondary | ICD-10-CM

## 2020-04-01 DIAGNOSIS — M533 Sacrococcygeal disorders, not elsewhere classified: Secondary | ICD-10-CM

## 2020-04-01 DIAGNOSIS — M48062 Spinal stenosis, lumbar region with neurogenic claudication: Secondary | ICD-10-CM

## 2020-04-01 DIAGNOSIS — Z853 Personal history of malignant neoplasm of breast: Secondary | ICD-10-CM

## 2020-04-01 DIAGNOSIS — E782 Mixed hyperlipidemia: Secondary | ICD-10-CM

## 2020-04-01 DIAGNOSIS — M5412 Radiculopathy, cervical region: Secondary | ICD-10-CM

## 2020-04-01 DIAGNOSIS — F32A Depression: Secondary | ICD-10-CM

## 2020-04-01 DIAGNOSIS — M5414 Radiculopathy, thoracic region: Secondary | ICD-10-CM

## 2020-04-01 DIAGNOSIS — M1990 Unspecified osteoarthritis, unspecified site: Secondary | ICD-10-CM

## 2020-04-01 DIAGNOSIS — G629 Polyneuropathy, unspecified: Secondary | ICD-10-CM

## 2020-04-01 DIAGNOSIS — E162 Hypoglycemia, unspecified: Secondary | ICD-10-CM

## 2020-04-01 DIAGNOSIS — Z1379 Encounter for other screening for genetic and chromosomal anomalies: Secondary | ICD-10-CM

## 2020-04-01 DIAGNOSIS — E785 Hyperlipidemia, unspecified: Secondary | ICD-10-CM

## 2020-04-01 DIAGNOSIS — U071 COVID-19: Secondary | ICD-10-CM

## 2020-04-01 DIAGNOSIS — M3500 Sicca syndrome, unspecified: Secondary | ICD-10-CM

## 2020-04-01 DIAGNOSIS — K219 Gastro-esophageal reflux disease without esophagitis: Secondary | ICD-10-CM

## 2020-04-01 DIAGNOSIS — Z9109 Other allergy status, other than to drugs and biological substances: Secondary | ICD-10-CM

## 2020-04-01 DIAGNOSIS — E559 Vitamin D deficiency, unspecified: Secondary | ICD-10-CM

## 2020-04-24 ENCOUNTER — Encounter: Admit: 2020-04-24 | Discharge: 2020-04-24 | Payer: MEDICARE

## 2020-04-24 NOTE — Telephone Encounter
Spoke with patient about seeing another provider in our [place] office. Patient is understanding and agreeable to this plan. Patient has been scheduled and notified of date/time and location.

## 2020-04-24 NOTE — Telephone Encounter
Patient calls with complaints of increased swelling of bilateral lower legs up to and including her knees and occasionally in her hands also. Patient describes her legs as being tight and the skin is shiny. She sometimes has difficulty bending her legs due to the swelling and has shortness of breath with exertion. She continues to wear compression stockings, elevate her feet when sitting and monitor salt intake in her diet. Her symptoms have progressively worsened over the past 4 weeks and has gained about 10 pounds.      Will route to [facility] for his review and recommendations.

## 2020-04-24 NOTE — Telephone Encounter
Michiel Cowboy, MD  I don't know what to do for her--I'm not there soon--can we get her in to see a mid-level or another physician next week?

## 2020-04-27 ENCOUNTER — Ambulatory Visit: Admit: 2020-04-27 | Discharge: 2020-04-27 | Payer: MEDICARE

## 2020-04-27 ENCOUNTER — Encounter: Admit: 2020-04-27 | Discharge: 2020-04-27 | Payer: MEDICARE

## 2020-04-27 DIAGNOSIS — Z9109 Other allergy status, other than to drugs and biological substances: Secondary | ICD-10-CM

## 2020-04-27 DIAGNOSIS — E782 Mixed hyperlipidemia: Secondary | ICD-10-CM

## 2020-04-27 DIAGNOSIS — K219 Gastro-esophageal reflux disease without esophagitis: Secondary | ICD-10-CM

## 2020-04-27 DIAGNOSIS — M810 Age-related osteoporosis without current pathological fracture: Secondary | ICD-10-CM

## 2020-04-27 DIAGNOSIS — E559 Vitamin D deficiency, unspecified: Secondary | ICD-10-CM

## 2020-04-27 DIAGNOSIS — M533 Sacrococcygeal disorders, not elsewhere classified: Secondary | ICD-10-CM

## 2020-04-27 DIAGNOSIS — E162 Hypoglycemia, unspecified: Secondary | ICD-10-CM

## 2020-04-27 DIAGNOSIS — E785 Hyperlipidemia, unspecified: Secondary | ICD-10-CM

## 2020-04-27 DIAGNOSIS — M549 Dorsalgia, unspecified: Secondary | ICD-10-CM

## 2020-04-27 DIAGNOSIS — C50212 Malignant neoplasm of upper-inner quadrant of left female breast: Secondary | ICD-10-CM

## 2020-04-27 DIAGNOSIS — Z853 Personal history of malignant neoplasm of breast: Secondary | ICD-10-CM

## 2020-04-27 DIAGNOSIS — Z1379 Encounter for other screening for genetic and chromosomal anomalies: Secondary | ICD-10-CM

## 2020-04-27 DIAGNOSIS — F32A Depression: Secondary | ICD-10-CM

## 2020-04-27 DIAGNOSIS — U071 COVID-19: Secondary | ICD-10-CM

## 2020-04-27 DIAGNOSIS — G629 Polyneuropathy, unspecified: Secondary | ICD-10-CM

## 2020-04-27 DIAGNOSIS — M1990 Unspecified osteoarthritis, unspecified site: Secondary | ICD-10-CM

## 2020-04-27 DIAGNOSIS — M3500 Sicca syndrome, unspecified: Secondary | ICD-10-CM

## 2020-04-27 NOTE — Discharge Instructions - Supplementary Instructions
GENERAL POST PROCEDURE INSTRUCTIONS    Time:____________________  Physician:_________________________________    Procedure Completed Today:  Joint Injection (hip, knee, shoulder)  Cervical Epidural Steroid Injection  Cervical Transforaminal Steroid Injection  Trigger Point Injection  Other___________________________ Thoracic Epidural Steroid Injection  Lumbar Epidural Steroid Injection  Lumbar Transforaminal Steroid Injection  Facet Joint Injection     Important information following your procedure today:  You may drive today     If you had sedation ,you may NOT drive today  Rest at home for the next 6 hours.  You may then begin to resume your normal activities.  DO NOT drive any vehicle, operate any power tools, drink alcohol, make any major decisions, or sign any legal documents for the next 12 hours.  Pain relief may not be immediate. It is possible you may even experience an increase in pain during the first 24-48 hours followed by a gradual decrease of your pain.  Though the procedure is generally safe and complications are rare, we do ask that you be aware of any of the following:  Any swelling, persistent redness, new bleeding or drainage from the site of the injection.  You should not experience a severe headache.  You should not run a fever over 101oF.  New onset of sharp, severe back and or neck pain.  New onset of upper or lower extremity numbness or weakness.  New difficulty controlling bowel or bladder function after injection.  New shortness of breath.  If any of these occur, please call to report this occurrence to a nurse at [phone]. If you are calling after 4:00pm, on the weekends or holidays please call [phone] and ask to have the pain management resident physician on call for the physician paged or go to your local emergency room.   You may experience soreness at the injection site. Ice can be applied at 20 minute intervals for the first 24 hours. The following day you may alternate ice with heat if you are experiencing muscle tightness, otherwise continue with ice. Ice works best at decreasing pain. Avoid application of direct heat, hot showers or hot tubs today.  Avoid strenuous activity today. You many resume your regular activities and exercise tomorrow.  Patients with diabetes may see an elevation in blood sugars for 7-10 days after the injection. It is important to pay close attention to you diet, check your blood sugars daily and repost extreme elevations to the physician that treats your diabetes.  Patients taking daily blood thinners can resume their regular dose this evening.  It is important that you take all medications ordered by your pain physician. Taking medications as ordered is an important part of you pain care plan. If you cannot continue the medication plan, please notify the physician.  Possible side effects to steroids that may occur:  Flushing or redness of the face  Irritability  Fluid retention  Change in women's menses  Follow up appointment as needed if in the event you are unable to keep an appointment please notify the scheduler 24 hours in advance at [phone].    ____________________________________  ____________________________________

## 2020-04-27 NOTE — Progress Notes
SPINE CENTER  INTERVENTIONAL PAIN PROCEDURE HISTORY AND PHYSICAL    Chief Complaint: Pain    HISTORY OF PRESENT ILLNESS:  pain    Medical History:   Diagnosis Date   ? Arthritis    ? Back pain 07/18/2007   ? Chest pain    ? COVID-19 12/28/2018   ? Depression    ? Elevated triglycerides with high cholesterol    ? Environmental allergies    ? 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 Hereditary Cancer Update Test - [facility])   ? GERD (gastroesophageal reflux disease)    ? Hyperlipemia    ? Hypoglycemia    ? 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 Neuropathy    ? Osteoporosis 2/09    BMD  T-score (-2.3) spine and (-2.9) femur on Actonel   ? Personal history of malignant neoplasm of breast    ? PN (Peripheral Neuropathy)    ? Sjogren's disease (HCC)    ? Vitamin D deficiency 6/09       Surgical History:   Procedure Laterality Date   ? CARDIAC CATHERIZATION  03/28/2005    LAD 20%, Circ ostial 30%, EF 65%   ? HX BREAST LUMPECTOMY  06/07/05    Left with SLNBX (0/2)   ? COLONOSCOPY N/A 03/01/2010    normal ([name], [facility])   ? HX HYSTERECTOMY  03/24/2011    [name], MD [facility]O   ? HX BLADDER SUSPENSION     ? HX TUBAL LIGATION         family history includes Brain Tumor (age of onset: 75) in an other family member; COPD in her daughter; Essie Christine (age of onset: 68) in an other family member; Physiological scientist (age of onset: 63) in an other family member; Physiological scientist (age of onset: 64) in her paternal aunt; Cancer-Breast (age of onset: 32) in an other family member; Cancer-Hematologic (age of onset: 67) in her daughter; Teresa Coombs (age of onset: 68) in her paternal uncle; Cancer-Lung (age of onset: 74) in an other family member; Cancer-Ovarian (age of onset: 62) in an other family member; Complete Hysterectomy in her sister; Complete Hysterectomy (age of onset: 18) in her daughter; Coronary Artery Disease in her father; Depression (age of onset: 59) in her brother; Diabetes Type II in her maternal grandmother; Heart Attack (age of onset: 42) in her brother; Heart Disease in her father, mother, and sister; Heart murmur in her sister; Hypoglycemia in her mother; None Reported in her grandchild, grandchild, grandchild, grandchild, grandchild, sister, and another family member; Osteopenia in her sister; Other in her grandchild; Other (age of onset: 0) in her brother; Other (age of onset: 28) in her brother; Other (age of onset: 3) in her sister; Tobacco disorder in her daughter.    Social History     Socioeconomic History   ? Marital status: Divorced     Spouse name: Not on file   ? Number of children: 2   ? Years of education: Not on file   ? Highest education level: GED or equivalent   Occupational History   ? Occupation: grounds Human resources officer: [name]   Tobacco Use   ? Smoking status: Former Smoker     Packs/day: 1.00     Years: 14.00     Pack  years: 14.00     Types: Cigarettes     Quit date: 04/07/2000     Years since quitting: 20.0   ? Smokeless tobacco: Never Used   Vaping Use   ? Vaping Use: Never used   Substance and Sexual Activity   ? Alcohol use: Not Currently   ? Drug use: No   ? Sexual activity: Never   Other Topics Concern   ? Not on file   Social History Narrative   ? Not on file       Allergies   Allergen Reactions   ? Sulfa (Sulfonamide Antibiotics) RASH and NAUSEA AND VOMITING     Allergy recorded in SMS: Sulfa       There were no vitals filed for this visit.       REVIEW OF SYSTEMS: 10 point ROS obtained and negative except pain      PHYSICAL EXAM:unchanged        IMPRESSION:    1. Sacroiliac joint dysfunction    2. Sacroiliac pain         PLAN: Other left SI joint inj

## 2020-04-27 NOTE — Procedures
Attending Surgeon: Rushie Nyhan, MD    Anesthesia: Local    Pre-Procedure Diagnosis:   1. Sacroiliac joint dysfunction    2. Sacroiliac pain        Post-Procedure Diagnosis:   1. Sacroiliac joint dysfunction    2. Sacroiliac pain        SI Joint Inject Anesth/Steroid (Procedure)  Location: sacroiliac joint L sacroiliac joint3/21/2022 2:23 PM    Consent:   Consent obtained: written  Consent given by: patient  Risks discussed: bleeding, infection, nerve damage, pain and soft tissue reaction  Alternatives discussed: alternative treatment, delayed treatment, no treatment and referral     Universal Protocol:  Relevant documents: relevant documents present and verified  Test results: test results available and properly labeled  Imaging studies: imaging studies available  Required items: required blood products, implants, devices, and special equipment available  Site marked: the operative site was marked  Patient identity confirmed: Patient identify confirmed verbally with patient.        Time out: Immediately prior to procedure a "time out" was called to verify the correct patient, procedure, equipment, support staff and site/side marked as required      Procedures Details:   Indications: pain and diagnostic evaluation   Prep: povidone-iodine  Guidance: fluoroscopy  Needle size: 22 G  Approach: posterior  Aspirate: clear  Patient tolerance: Patient tolerated the procedure well with no immediate complications. Pressure was applied, and hemostasis was accomplished.  Comments: 53ml bupi and 40mg  depomedrol inj  No complications          Estimated blood loss: none or minimal  Specimens: none  Patient tolerated the procedure well with no immediate complications. Pressure was applied, and hemostasis was accomplished.

## 2020-04-28 ENCOUNTER — Encounter: Admit: 2020-04-28 | Discharge: 2020-04-28 | Payer: MEDICARE

## 2020-04-28 ENCOUNTER — Ambulatory Visit: Admit: 2020-04-28 | Discharge: 2020-04-28 | Payer: MEDICARE

## 2020-04-28 DIAGNOSIS — G909 Disorder of the autonomic nervous system, unspecified: Secondary | ICD-10-CM

## 2020-04-28 DIAGNOSIS — M1990 Unspecified osteoarthritis, unspecified site: Secondary | ICD-10-CM

## 2020-04-28 DIAGNOSIS — I471 Supraventricular tachycardia: Secondary | ICD-10-CM

## 2020-04-28 DIAGNOSIS — K219 Gastro-esophageal reflux disease without esophagitis: Secondary | ICD-10-CM

## 2020-04-28 DIAGNOSIS — E162 Hypoglycemia, unspecified: Secondary | ICD-10-CM

## 2020-04-28 DIAGNOSIS — I447 Left bundle-branch block, unspecified: Secondary | ICD-10-CM

## 2020-04-28 DIAGNOSIS — I10 Essential (primary) hypertension: Secondary | ICD-10-CM

## 2020-04-28 DIAGNOSIS — Z1379 Encounter for other screening for genetic and chromosomal anomalies: Secondary | ICD-10-CM

## 2020-04-28 DIAGNOSIS — R600 Localized edema: Secondary | ICD-10-CM

## 2020-04-28 DIAGNOSIS — G629 Polyneuropathy, unspecified: Secondary | ICD-10-CM

## 2020-04-28 DIAGNOSIS — E782 Mixed hyperlipidemia: Secondary | ICD-10-CM

## 2020-04-28 DIAGNOSIS — M549 Dorsalgia, unspecified: Secondary | ICD-10-CM

## 2020-04-28 DIAGNOSIS — E559 Vitamin D deficiency, unspecified: Secondary | ICD-10-CM

## 2020-04-28 DIAGNOSIS — Z853 Personal history of malignant neoplasm of breast: Secondary | ICD-10-CM

## 2020-04-28 DIAGNOSIS — F32A Depression: Secondary | ICD-10-CM

## 2020-04-28 DIAGNOSIS — M810 Age-related osteoporosis without current pathological fracture: Secondary | ICD-10-CM

## 2020-04-28 DIAGNOSIS — U071 COVID-19: Secondary | ICD-10-CM

## 2020-04-28 DIAGNOSIS — C50212 Malignant neoplasm of upper-inner quadrant of left female breast: Secondary | ICD-10-CM

## 2020-04-28 DIAGNOSIS — M3500 Sicca syndrome, unspecified: Secondary | ICD-10-CM

## 2020-04-28 DIAGNOSIS — Z9109 Other allergy status, other than to drugs and biological substances: Secondary | ICD-10-CM

## 2020-04-28 DIAGNOSIS — E785 Hyperlipidemia, unspecified: Secondary | ICD-10-CM

## 2020-04-28 LAB — BASIC METABOLIC PANEL
Lab: 0.8 K/UL (ref 0–0.20)
Lab: 105 mL/min (ref >60)
Lab: 127 — ABNORMAL HIGH (ref 70–105)
Lab: 13 K/UL (ref 0–0.45)
Lab: 140 U/L — AB (ref 7–56)
Lab: 25 K/UL (ref 0–0.80)
Lab: 9.6

## 2020-04-28 NOTE — Progress Notes
Ambulatory (External) Cardiac Monitor Placement Record    Patient was seen today for placement of an ambulatory cardiac monitor.        Brand: [employer] ([employer])  Serial Number:  Location where monitor was placed:  Home Enrollment  Start Time and Date: 04/28/20 10:17 AM  Will Holter be returned by mail? Yes   Patient instructed to contact company phone number on the monitor box with questions regarding billing, placement, troubleshooting.     Neva Seat

## 2020-04-28 NOTE — Patient Instructions
Labs can be done today.  Echo   Holter: will be mailed to you    Follow up as directed.  Call sooner if issues.  Call the [place] line at [phone].  Leave a detailed message for the nurse in [facility] with how we can assist you and we will call you back.

## 2020-04-29 ENCOUNTER — Encounter: Admit: 2020-04-29 | Discharge: 2020-04-29 | Payer: MEDICARE

## 2020-05-01 ENCOUNTER — Encounter: Admit: 2020-05-01 | Discharge: 2020-05-01 | Payer: MEDICARE

## 2020-05-01 DIAGNOSIS — M533 Sacrococcygeal disorders, not elsewhere classified: Secondary | ICD-10-CM

## 2020-05-11 ENCOUNTER — Encounter: Admit: 2020-05-11 | Discharge: 2020-05-11 | Payer: MEDICARE

## 2020-05-11 ENCOUNTER — Ambulatory Visit: Admit: 2020-05-11 | Discharge: 2020-05-11 | Payer: MEDICARE

## 2020-05-11 DIAGNOSIS — I471 Supraventricular tachycardia: Secondary | ICD-10-CM

## 2020-05-11 DIAGNOSIS — I447 Left bundle-branch block, unspecified: Secondary | ICD-10-CM

## 2020-05-11 DIAGNOSIS — G909 Disorder of the autonomic nervous system, unspecified: Secondary | ICD-10-CM

## 2020-05-11 MED ORDER — PERFLUTREN LIPID MICROSPHERES 1.1 MG/ML IV SUSP
1-20 mL | Freq: Once | INTRAVENOUS | 0 refills | Status: CP | PRN
Start: 2020-05-11 — End: ?

## 2020-05-13 ENCOUNTER — Encounter: Admit: 2020-05-13 | Discharge: 2020-05-13 | Payer: MEDICARE

## 2020-05-13 ENCOUNTER — Ambulatory Visit: Admit: 2020-05-13 | Discharge: 2020-05-14 | Payer: MEDICARE

## 2020-05-13 DIAGNOSIS — G629 Polyneuropathy, unspecified: Secondary | ICD-10-CM

## 2020-05-13 DIAGNOSIS — M810 Age-related osteoporosis without current pathological fracture: Secondary | ICD-10-CM

## 2020-05-13 DIAGNOSIS — M26609 Unspecified temporomandibular joint disorder, unspecified side: Secondary | ICD-10-CM

## 2020-05-13 DIAGNOSIS — Z853 Personal history of malignant neoplasm of breast: Secondary | ICD-10-CM

## 2020-05-13 DIAGNOSIS — U071 COVID-19: Secondary | ICD-10-CM

## 2020-05-13 DIAGNOSIS — Z1379 Encounter for other screening for genetic and chromosomal anomalies: Secondary | ICD-10-CM

## 2020-05-13 DIAGNOSIS — F32A Depression: Secondary | ICD-10-CM

## 2020-05-13 DIAGNOSIS — E162 Hypoglycemia, unspecified: Secondary | ICD-10-CM

## 2020-05-13 DIAGNOSIS — M549 Dorsalgia, unspecified: Secondary | ICD-10-CM

## 2020-05-13 DIAGNOSIS — C50212 Malignant neoplasm of upper-inner quadrant of left female breast: Secondary | ICD-10-CM

## 2020-05-13 DIAGNOSIS — M3500 Sicca syndrome, unspecified: Secondary | ICD-10-CM

## 2020-05-13 DIAGNOSIS — E782 Mixed hyperlipidemia: Secondary | ICD-10-CM

## 2020-05-13 DIAGNOSIS — K219 Gastro-esophageal reflux disease without esophagitis: Secondary | ICD-10-CM

## 2020-05-13 DIAGNOSIS — E785 Hyperlipidemia, unspecified: Secondary | ICD-10-CM

## 2020-05-13 DIAGNOSIS — E559 Vitamin D deficiency, unspecified: Secondary | ICD-10-CM

## 2020-05-13 DIAGNOSIS — Z9109 Other allergy status, other than to drugs and biological substances: Secondary | ICD-10-CM

## 2020-05-13 DIAGNOSIS — M1990 Unspecified osteoarthritis, unspecified site: Secondary | ICD-10-CM

## 2020-05-13 LAB — CBC AND DIFF
Lab: 0 K/UL (ref 0–0.20)
Lab: 0 K/UL (ref 0–0.45)
Lab: 0.3 K/UL (ref 0–0.80)
Lab: 1 % (ref 0–2)
Lab: 1 % (ref >60)
Lab: 1.5 K/UL (ref 1.0–4.8)
Lab: 14 % (ref 11–15)
Lab: 14 g/dL (ref 12.0–15.0)
Lab: 218 K/UL (ref 150–400)
Lab: 26 % (ref 24–44)
Lab: 3.8 K/UL (ref 1.8–7.0)
Lab: 30 pg (ref 26–34)
Lab: 4.7 M/UL — ABNORMAL HIGH (ref 4.0–5.0)
Lab: 5.9 K/UL (ref 4.5–11.0)
Lab: 65 % (ref 41–77)
Lab: 7 % (ref 4–12)
Lab: 8.5 FL (ref 7–11)
Lab: 91 FL (ref 80–100)

## 2020-05-13 LAB — COMPREHENSIVE METABOLIC PANEL
Lab: 145 MMOL/L (ref 137–147)
Lab: 3.9 MMOL/L (ref 3.5–5.1)

## 2020-05-13 LAB — KAPPA/LAMBDA FREE LIGHT CHAINS
Lab: 1.3 mg/dL (ref 0.33–1.94)
Lab: 1.3 mg/dL (ref 0.57–2.63)

## 2020-05-13 LAB — IMMUNOGLOBULINS-IGA,IGG,IGM
Lab: 121 mg/dL (ref 70–390)
Lab: 613 mg/dL — ABNORMAL LOW (ref 762–1488)

## 2020-05-13 LAB — SED RATE: Lab: 15 mm/h (ref 0–30)

## 2020-05-13 LAB — C4 COMPLEMENT 4: Lab: 43 mg/dL (ref 10–49)

## 2020-05-13 LAB — C REACTIVE PROTEIN (CRP): Lab: 0 mg/dL (ref <1.0)

## 2020-05-13 LAB — BETA 2 MICROGLOBULIN: Lab: 1.7 mg/L (ref 0.8–2.3)

## 2020-05-13 LAB — C3 COMPLEMENT 3: Lab: 134 mg/dL (ref 88–200)

## 2020-05-13 NOTE — Progress Notes
Kristen Norris  is a 70 year old- Caucasian female here for a follow-up visit    The patient is referred by Dr. Olene Norris    Chief complaint: Management of seronegative Sjogren's syndrome      Interval history:  This lady was last evaluated in October at which time we recommended staying on the same dose of hydroxychloroquine, due to her stable disease, Her labs in January revealed normal CBC and CMP, vitamin D was normal as well  BNPep was mildly elevated 180 in March   She now reports several complaints, first over the last 2 months she noticed an use of continuous slightly painful and tender lesion involving the dorsum of her arm, just proximal to the wrist, no trauma or injury, no skin changes, no similar symptoms anywhere else, the lesion is mobile  Additionally over the last few months she has been noticing pain with clicking and grinding sensation with chewing involving the left TMJ, and to lesser degree on the right side as well, again there is no history of trauma or injury, the symptoms are significant enough for her to have stopped using gums to alleviate the dryness in her oral mucosa, there is no history of intermittent jaw claudication, her dentist suggested maxillofacial surgery consult  She also has been dealing with some cardiac issues with leg edema for which 2D echo was repeated and she had a Holter monitor, she has been discussing these issues with her cardiologist actively  No new rashes, joint pain beyond the TMJ, or swollen glands      Current medication list:  Reviewed      Physical Examination:    VITAL SIGNS: BP 138/69 (BP Source: Arm, Right Upper, Patient Position: Sitting)  - Pulse 69  - Temp 36.3 ?C (97.3 ?F) (Temporal)  - Resp 16  - Ht 160 cm (5' 3)  - Wt 71.2 kg (157 lb)  - LMP  (LMP Unknown)  - SpO2 99%  - BMI 27.81 kg/m?      GENERAL APPEARANCE: The patient is a well-nourished adult in no acute distress    EYES: No scleral erythema or conjunctival injection.    There is no parotitis, clicking noted while moving TMJ on left side, no tenderness surrounding the area    Examination of the right wrist area reveals mobile, well-demarcated lesion measuring approximately 3 cm in diameter without overlying skin changes few centimeters proximal to the dorsum of the right wrist    Schirmer's test  OS 3 mm  OD 4 mm     Unstimulated salivary flow rate= 0.16 ml/min                                   Assessment and plan:    1.  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 Osteoporosis: she was on prolia but could not afford it after 2 doses, Fosamax was started in January 2021 but stopped in May due to epigastric pain, she received the first dose of Reclast in June 2021, we recommend proceeding with the second dose in June    3. 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 Breast cancer: Diagnosed in 2007, in remission since       Plan:  Proceed with Reclast in June  Referred to general surgery, she will let me know if she does not hear from our surgery department within a week  Referred to maxillofacial surgery  Sjogren's activity labs  Continue hydroxychloroquine    I will see her back in about 4 months    Kristen Humbles, MD    Because this dictation was prepared with voice recognition software, there remains a potential for typographical errors or incorrect word choices by the system. We apologize for any inadvertent inconvenience from such an error.

## 2020-05-14 DIAGNOSIS — M3500 Sicca syndrome, unspecified: Principal | ICD-10-CM

## 2020-05-14 DIAGNOSIS — R2231 Localized swelling, mass and lump, right upper limb: Secondary | ICD-10-CM

## 2020-05-18 ENCOUNTER — Encounter: Admit: 2020-05-18 | Discharge: 2020-05-18 | Payer: MEDICARE

## 2020-05-18 NOTE — Telephone Encounter
-----   Message from Wynona Meals, RN sent at 05/18/2020 12:33 PM CDT -----    ----- Message -----  From: Shan Levans, MD  Sent: 05/16/2020   8:55 AM CDT  To: Binnie Kand, RN    I think the echo is underestimated.  Please arrange for a MUGA study to document LV EF and be sure we have a folow up planned.  Do MUGA soon please.

## 2020-05-19 ENCOUNTER — Encounter: Admit: 2020-05-19 | Discharge: 2020-05-19 | Payer: MEDICARE

## 2020-05-19 NOTE — Telephone Encounter
Intake  Document           Patient Name:  Kristen Norris  DOB:  09-20-1950    Appointment Info:  05/25/2020 1040 am    Diagnosis & Reason for Visit:  Right wrist mass; orthopedic surgical oncology consult    Physician Info:   Referring Physician:  Dr. Clare Charon Name & Number:  [facility]      Info: [name] is a 70 year old female who is referred for a right wrist mass. This has presented itself over the last few months. Katherine states that the mass is sometimes tender to the touch, especially if a grandchild grabs for her wrist. Delcie Roch follows with [facility] for a history of breast cancer (dx 2007) and sees Dr. Jacquiline Doe for Sjogren's disease. She has not yet had any imaging.    Location of Imaging and Records:  No imaging at this time, records in [facility]

## 2020-05-20 ENCOUNTER — Encounter: Admit: 2020-05-20 | Discharge: 2020-05-20 | Payer: MEDICARE

## 2020-05-20 DIAGNOSIS — M5414 Radiculopathy, thoracic region: Secondary | ICD-10-CM

## 2020-05-21 ENCOUNTER — Ambulatory Visit: Admit: 2020-05-21 | Discharge: 2020-05-21 | Payer: MEDICARE

## 2020-05-21 ENCOUNTER — Encounter: Admit: 2020-05-21 | Discharge: 2020-05-21 | Payer: MEDICARE

## 2020-05-21 DIAGNOSIS — M5414 Radiculopathy, thoracic region: Secondary | ICD-10-CM

## 2020-05-21 MED ORDER — IOHEXOL 240 MG IODINE/ML IV SOLN
2.5 mL | Freq: Once | EPIDURAL | 0 refills | Status: CP
Start: 2020-05-21 — End: ?
  Administered 2020-05-21: 18:00:00 2.5 mL via EPIDURAL

## 2020-05-21 MED ORDER — TRIAMCINOLONE ACETONIDE 40 MG/ML IJ SUSP
80 mg | Freq: Once | EPIDURAL | 0 refills | Status: CP
Start: 2020-05-21 — End: ?
  Administered 2020-05-21: 18:00:00 80 mg via EPIDURAL

## 2020-05-21 NOTE — Progress Notes
SPINE CENTER  INTERVENTIONAL PAIN PROCEDURE HISTORY AND PHYSICAL    Chief Complaint: Pain    HISTORY OF PRESENT ILLNESS:  painp    Medical History:   Diagnosis Date   ? Arthritis    ? Back pain 07/18/2007   ? Chest pain    ? COVID-19 12/28/2018   ? Depression    ? Elevated triglycerides with high cholesterol    ? Environmental allergies    ? 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 Hereditary Cancer Update Test - [facility])   ? GERD (gastroesophageal reflux disease)    ? Hyperlipemia    ? Hypoglycemia    ? 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 Neuropathy    ? Osteoporosis 2/09    BMD  T-score (-2.3) spine and (-2.9) femur on Actonel   ? Personal history of malignant neoplasm of breast    ? PN (Peripheral Neuropathy)    ? Sjogren's disease (HCC)    ? Vitamin D deficiency 6/09       Surgical History:   Procedure Laterality Date   ? CARDIAC CATHERIZATION  03/28/2005    LAD 20%, Circ ostial 30%, EF 65%   ? HX BREAST LUMPECTOMY  06/07/05    Left with SLNBX (0/2)   ? COLONOSCOPY N/A 03/01/2010    normal ([name], [facility])   ? HX HYSTERECTOMY  03/24/2011    [name], MD [facility]/BSO   ? HX BLADDER SUSPENSION     ? HX TUBAL LIGATION         family history includes Brain Tumor (age of onset: 38) in an other family member; COPD in her daughter; Essie Christine (age of onset: 39) in an other family member; Physiological scientist (age of onset: 23) in an other family member; Physiological scientist (age of onset: 19) in her paternal aunt; Cancer-Breast (age of onset: 73) in an other family member; Cancer-Hematologic (age of onset: 57) in her daughter; Teresa Coombs (age of onset: 15) in her paternal uncle; Cancer-Lung (age of onset: 51) in an other family member; Cancer-Ovarian (age of onset: 43) in an other family member; Complete Hysterectomy in her sister; Complete Hysterectomy (age of onset: 3) in her daughter; Coronary Artery Disease in her father; Depression (age of onset: 19) in her brother; Diabetes Type II in her maternal grandmother; Heart Attack (age of onset: 51) in her brother; Heart Disease in her father, mother, and sister; Heart murmur in her sister; Hypoglycemia in her mother; None Reported in her grandchild, grandchild, grandchild, grandchild, grandchild, sister, and another family member; Osteopenia in her sister; Other in her grandchild; Other (age of onset: 0) in her brother; Other (age of onset: 26) in her brother; Other (age of onset: 91) in her sister; Tobacco disorder in her daughter.    Social History     Socioeconomic History   ? Marital status: Divorced     Spouse name: Not on file   ? Number of children: 2   ? Years of education: Not on file   ? Highest education level: GED or equivalent   Occupational History   ? Occupation: grounds Human resources officer: [name]   Tobacco Use   ? Smoking status: Former Smoker     Packs/day: 1.00     Years: 14.00     Pack  years: 14.00     Types: Cigarettes     Quit date: 04/07/2000     Years since quitting: 20.1   ? Smokeless tobacco: Never Used   Vaping Use   ? Vaping Use: Never used   Substance and Sexual Activity   ? Alcohol use: Not Currently   ? Drug use: No   ? Sexual activity: Never   Other Topics Concern   ? Not on file   Social History Narrative   ? Not on file       Allergies   Allergen Reactions   ? Sulfa (Sulfonamide Antibiotics) RASH and NAUSEA AND VOMITING     Allergy recorded in SMS: Sulfa       Vitals:    05/21/20 1203   BP: 138/80   BP Source: Arm, Right Upper   Pulse: 54   Temp: 36.7 ?C (98.1 ?F)   Resp: 14   SpO2: 96%   O2 Device: None (Room air)   TempSrc: Oral   PainSc: Three     Pain Score: Three    REVIEW OF SYSTEMS: 10 point ROS obtained and negative except pain      PHYSICAL EXAM:unchanged        IMPRESSION:    1. Thoracic radiculopathy         PLAN: Thoracic Epidural Steroid Injection

## 2020-05-21 NOTE — Patient Instructions
Procedure Completed Today: Thoracic Epidural Steroid Injection    Important information following your procedure today: You may drive today    1. Pain relief may not be immediate. It is possible you may even experience an increase in pain during the first 24-48 hours followed by a gradual decrease of your pain.  2. Though the procedure is generally safe and complications are rare, we do ask that you be aware of any of the following:   ? Any swelling, persistent redness, new bleeding, or drainage from the site of the injection.  ? You should not experience a severe headache.  ? You should not run a fever over 101? F.  ? New onset of sharp, severe back & or neck pain.  ? New onset of upper or lower extremity numbness or weakness.  ? New difficulty controlling bowel or bladder function after the injection.  ? New shortness of breath.    If any of these occur, please call to report this occurrence to a nurse at [phone]. If you are calling after 4:00 p.m., on weekends or holidays please call [phone] and ask to have the resident physician on call for the physician paged or go to your local emergency room.  3. You may experience soreness at the injection site. Ice can be applied at 20 minute intervals. Avoid application of direct heat, hot showers or hot tubs today.  4. Avoid strenuous activity today. You may resume your regular activities and exercise tomorrow.  5. Patients with diabetes may see an elevation in blood sugars for 7-10 days after the injection. It is important to pay close attention to your diet, check your blood sugars daily and report extreme elevations to the physician that treats your diabetes.  6. Patients taking a daily blood thinner can resume their regular dose this evening.  7. It is important that you take all medications ordered by your pain physician. Taking medication as ordered is an important part of your pain care plan. If you cannot continue the medication plan, please notify the physician.     Possible side effects to steroids that may occur:  ? Flushing or redness of the face  ? Irritability  ? Fluid retention  ? Change in women?s menses    The following medications were used: Lidocaine , Triamcinolone   and Contrast Dye

## 2020-05-21 NOTE — Procedures
Attending Surgeon: Jerilynn Som, MD    Anesthesia: Local    Pre-Procedure Diagnosis:   1. Thoracic radiculopathy        Post-Procedure Diagnosis:   1. Thoracic radiculopathy        Pain Score: Three    [facility] AMB SPINE INJECT INTERLAM CRV/THRC  Procedure: epidural - interlaminar    Laterality: n/a   on 05/21/2020 12:15 PM  Location: thoracic -  T8-9      Consent:   Consent obtained: written  Consent given by: patient  Risks discussed: allergic reaction, bleeding, bruising, infection, no change or worsening in pain, reaction to medication and weakness  Alternatives discussed: alternative treatment, delayed treatment, no treatment and referral     Universal Protocol:  Relevant documents: relevant documents present and verified  Test results: test results available and properly labeled  Imaging studies: imaging studies available  Required items: required blood products, implants, devices, and special equipment available  Site marked: the operative site was marked  Patient identity confirmed: Patient identify confirmed verbally with patient.        Time out: Immediately prior to procedure a time out was called to verify the correct patient, procedure, equipment, support staff and site/side marked as required      Procedures Details:   Indications: pain   Prep: chlorhexidine  Patient position: prone  Estimated Blood Loss: minimal  Specimens: none  Number of Joints: 1  Approach: midline  Guidance: fluoroscopy  Contrast: Procedure confirmed with contrast under live fluoroscopy.  Needle and Epidural Catheter: tuohy  Needle size: 18 G  Injection procedure: Incremental injection, Negative aspiration for blood and Introduced needle  Patient tolerance: Patient tolerated the procedure well with no immediate complications. Pressure was applied, and hemostasis was accomplished.  Outcome: Pain improved  Comments: 4ml NS and 80mg  kenalog    This patient's clinical history, exam, AND imaging support radiculopathy AND there is a significant impact on quality of life and function AND their pain score has been documented in this note AND the pain has been present for at least 4 weeks AND they have failed to improve with noninvasive conservative care.   This patient had at least 50% pain relief for at least 3 months with the last epidural injection.    This patient's pain is so severe it results in a significant degree of disability. Prior ESI has provided at least a 50% improvement in pain and function for at least 3 months. The patient's Primary Care Physician has been notified of the continuation of this procedure and prolonged repeat steroid use. The patient is not a surgical candidate.      Estimated blood loss: none or minimal  Specimens: none  Patient tolerated the procedure well with no immediate complications. Pressure was applied, and hemostasis was accomplished.

## 2020-05-21 NOTE — Progress Notes

## 2020-05-25 ENCOUNTER — Encounter: Admit: 2020-05-25 | Discharge: 2020-05-25 | Payer: MEDICARE

## 2020-05-25 MED ORDER — GABAPENTIN 300 MG PO CAP
ORAL_CAPSULE | Freq: Two times a day (BID) | 0 refills
Start: 2020-05-25 — End: ?

## 2020-05-25 NOTE — Telephone Encounter
Records received from Dr Victory Dakin office. [place].    Staff message sent to Dr Jacquiline Doe for review

## 2020-05-26 ENCOUNTER — Encounter: Admit: 2020-05-26 | Discharge: 2020-05-26 | Payer: MEDICARE

## 2020-05-26 NOTE — Telephone Encounter
Pt LVM reporting swelling and pain in left knee. She had swelling in her right hand. She reports she was taking muscle relaxants that seemed to help improve the pain. It was worse from Sunday night until Monday night. She had tried aleve with no improvement in pain. She was unable to say if she had any warmth or redness in her joints, or fever. She feels better today, pain has significantly improved. She has some pain in her knee and hip but reports it is dull and she has not had to take any pain medication. Swelling has resolved. Advised pt to reach out to rheumatology clinic if she develops these symptoms again, as she may be having a flare. Instructed pt to go to ER if she ever has uncontrolled pain, fever. Pt verbalized understanding, denied other questions at this time.

## 2020-05-27 ENCOUNTER — Encounter: Admit: 2020-05-27 | Discharge: 2020-05-27 | Payer: MEDICARE

## 2020-05-27 MED ORDER — OXYBUTYNIN CHLORIDE 10 MG PO TR24
ORAL_TABLET | Freq: Every day | 0 refills
Start: 2020-05-27 — End: ?

## 2020-05-29 ENCOUNTER — Encounter: Admit: 2020-05-29 | Discharge: 2020-05-29 | Payer: MEDICARE

## 2020-06-01 ENCOUNTER — Encounter: Admit: 2020-06-01 | Discharge: 2020-06-01 | Payer: MEDICARE

## 2020-06-01 DIAGNOSIS — E785 Hyperlipidemia, unspecified: Secondary | ICD-10-CM

## 2020-06-01 DIAGNOSIS — Z9109 Other allergy status, other than to drugs and biological substances: Secondary | ICD-10-CM

## 2020-06-01 DIAGNOSIS — C50212 Malignant neoplasm of upper-inner quadrant of left female breast: Secondary | ICD-10-CM

## 2020-06-01 DIAGNOSIS — E162 Hypoglycemia, unspecified: Secondary | ICD-10-CM

## 2020-06-01 DIAGNOSIS — E559 Vitamin D deficiency, unspecified: Secondary | ICD-10-CM

## 2020-06-01 DIAGNOSIS — K219 Gastro-esophageal reflux disease without esophagitis: Secondary | ICD-10-CM

## 2020-06-01 DIAGNOSIS — E782 Mixed hyperlipidemia: Secondary | ICD-10-CM

## 2020-06-01 DIAGNOSIS — M810 Age-related osteoporosis without current pathological fracture: Secondary | ICD-10-CM

## 2020-06-01 DIAGNOSIS — G629 Polyneuropathy, unspecified: Secondary | ICD-10-CM

## 2020-06-01 DIAGNOSIS — F32A Depression: Secondary | ICD-10-CM

## 2020-06-01 DIAGNOSIS — M3500 Sicca syndrome, unspecified: Secondary | ICD-10-CM

## 2020-06-01 DIAGNOSIS — Z853 Personal history of malignant neoplasm of breast: Secondary | ICD-10-CM

## 2020-06-01 DIAGNOSIS — M1990 Unspecified osteoarthritis, unspecified site: Secondary | ICD-10-CM

## 2020-06-01 DIAGNOSIS — M659 Synovitis and tenosynovitis, unspecified: Secondary | ICD-10-CM

## 2020-06-01 DIAGNOSIS — M549 Dorsalgia, unspecified: Secondary | ICD-10-CM

## 2020-06-01 DIAGNOSIS — U071 COVID-19: Secondary | ICD-10-CM

## 2020-06-01 DIAGNOSIS — Z1379 Encounter for other screening for genetic and chromosomal anomalies: Secondary | ICD-10-CM

## 2020-06-01 NOTE — Progress Notes
Orthopaedic Surgery History and Physical - Jannette Fogo, MD    Referring Provider: Concha Norway    Date of Visit: 06/01/20  _______________________________________________    DIAGNOSIS:   Inflammatory tenosynovitis, right wrist     PLAN:   - No acute surgical intervention at this time  - Recommend evaluation by Dr. Cecelia Byars for likely rheumatoid arthritis flare and secondary ongoing inflammatory tenosynovitis of the wrist  - No concern for malignant soft tissue mass at this time    ASSESSMENT:   Kristen Norris has a localized area of soft tissue swelling overlying her third and fourth dorsal compartments of the right wrist. It is quite tender to touch and has been present for 3-4 months. Given her history and exam the area of swelling likely represents localized inflammation of the tenosynovium of the dorsal compartments of the wrist as opposed to a new malignant soft tissue mass. Recommend she follow-up with rheumatologist for treatment of ongoing RA flare. If the area of swelling were to grow in size or her pain not be relieved with treatment of her RA, a surgical debridement/excision of the synovitis could be discussed.  ________________________________________________    HISTORY OF PRESENT ILLNESS: Kristen Norris is a 70 y.o. right-hand dominant female who presents with a mass in the right wrist.  The mass was first noticed approximately 3-4 months ago.  It is painful.  Since first noticing the mass, the patient reports that it has grown larger.  There have not been changes to the overlying skin.  The patient denies fevers, chills, or unexpected weight loss - she has actually gained weight recently.  Workup to this point has not included plain radiographs or advanced imaging.     IMAGING: No imaging was obtained prior to today's visit    PHYSICAL EXAM:  Vitals:    06/01/20 1020   BP: (!) 142/72   Pulse: 58   Temp: 36.4 ?C (97.5 ?F)   Resp: 16   SpO2: 97%       Musculoskeletal Exam of Right Upper Extremity:    - Palpation: TTP about the radial aspect of the dorsal R wrist   - ROM: flexion/extension of the R wrist symmetric to L; no pain with resisted flexion/extension of the wrist or fingers   - Motor: intact throughout extremity   - Sensation: intact throughout extremity   - Skin slightly erythematous about the hands; warm and well perfused    PAST MEDICAL HISTORY:   Medical History:   Diagnosis Date   ? Arthritis    ? Back pain 07/18/2007   ? Chest pain    ? COVID-19 12/28/2018   ? Depression    ? Elevated triglycerides with high cholesterol    ? Environmental allergies    ? 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 Hereditary Cancer Update Test - [place])   ? GERD (gastroesophageal reflux disease)    ? Hyperlipemia    ? Hypoglycemia    ? 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 Neuropathy    ? Osteoporosis 2/09    BMD  T-score (-2.3) spine and (-2.9) femur on Actonel   ? Personal history of malignant neoplasm of breast    ? PN (Peripheral Neuropathy)    ? Sjogren's disease (HCC)    ? Vitamin D deficiency 6/09  PAST SURGICAL HISTORY:   Surgical History:   Procedure Laterality Date   ? CARDIAC CATHERIZATION  03/28/2005    LAD 20%, Circ ostial 30%, EF 65%   ? HX BREAST LUMPECTOMY  06/07/05    Left with SLNBX (0/2)   ? COLONOSCOPY N/A 03/01/2010    normal ([name], [facility])   ? HX HYSTERECTOMY  03/24/2011    [name], MD [facility]/BSO   ? HX BLADDER SUSPENSION     ? HX TUBAL LIGATION         FAMILY HISTORY:   Family History   Problem Relation Age of Onset   ? Heart Disease Mother    ? Hypoglycemia Mother    ? Heart Disease Father         farmer   ? Coronary Artery Disease Father    ? Heart Attack Brother 45   ? Other Brother 55        shot himself 06/30/2019   ? Complete Hysterectomy Sister         rotator cuff surgery   ? Other Sister 49        COVID complications   ? Diabetes Type II Maternal Grandmother    ? Cancer-Hematologic Daughter 19   ? COPD Daughter         on oxygen   ? Tobacco disorder Daughter         on Chantix   ? Complete Hysterectomy Daughter 60   ? None Reported Grandchild    ? Other Grandchild         wrestler-stomach problems   ? None Reported Grandchild    ? None Reported Grandchild    ? None Reported Grandchild    ? None Reported Grandchild    ? None Reported Sister    ? Heart murmur Sister    ? Heart Disease Sister    ? Osteopenia Sister    ? Depression Brother 56        hung himself 05/2017   ? Other Brother 0        died in utero-umbilical cord wrapped around him   ? Cancer-Breast Paternal Aunt 55        bilateral mastectomy   ? Cancer-Breast Other 60        paternal 1st cousin   ? Cancer-Lung Other 46        paternal 1st cousin   ? Cancer-Breast Other 40        paternal 1st cousin   ? Cancer-Ovarian Other 25   ? None Reported Other    ? Cancer-Breast Other 59        paternal 1st cousin   ? Brain Tumor Other 20        nephew   ? Cancer-Lung Paternal Uncle 45   ? Cervical Cancer Neg Hx    ? Cancer-Colon Neg Hx    ? Cancer-Uterine Neg Hx    ? Bleeding Disorders Neg Hx    ? Diabetes Neg Hx        SOCIAL HISTORY:  reports that she quit smoking about 20 years ago. Her smoking use included cigarettes. She has a 14.00 pack-year smoking history. She has never used smokeless tobacco. She reports previous alcohol use. She reports that she does not use drugs.    MEDICATIONS:   Current Outpatient Medications:   ?  amitriptyline (ELAVIL) 25 mg tablet, Take 25 mg by mouth at bedtime as needed., Disp: , Rfl:   ?  aspirin EC 81 mg tablet, Take 81 mg by mouth daily. Taking every 3 days, Disp: , Rfl:   ?  atorvastatin (LIPITOR) 20 mg tablet, Take one tablet by mouth daily., Disp: 90 tablet, Rfl: 3  ?  benzonatate (TESSALON PERLES) 100 mg capsule, Take 100 mg by mouth every 8 hours as needed for Cough., Disp: , Rfl:   ?  CALCIUM CARBONATE (CALCIUM 500 PO), Take 1 Cap by mouth Daily., Disp: , Rfl:   ?  Cholecalciferol (VITAMIN D3) 125 mcg (5,000 unit) capsule, Take 10,000 Units by mouth daily., Disp: , Rfl:   ?  cyclobenzaprine (FLEXERIL) 5 mg tablet, Take one tablet by mouth three times daily as needed for Muscle Cramps., Disp: 90 tablet, Rfl: 3  ?  flecainide (TAMBOCOR) 50 mg tablet, Take 1 tablet by mouth twice daily, Disp: 60 tablet, Rfl: 11  ?  gabapentin (NEURONTIN) 300 mg capsule, TAKE 1 CAPSULE BY MOUTH THREE TIMES DAILY, Disp: 90 capsule, Rfl: 1  ?  hydrOXYchloroQUINE (PLAQUENIL) 200 mg tablet, Take one tablet by mouth twice daily. Take with food., Disp: 180 tablet, Rfl: 3  ?  methocarbamoL (ROBAXIN) 500 mg tablet, Take two tablets by mouth four times daily., Disp: 60 tablet, Rfl: 0  ?  metoprolol XL (TOPROL XL) 50 mg extended release tablet, Take 1 tablet by mouth once daily, Disp: 30 tablet, Rfl: 11  ?  oxybutynin XL (DITROPAN XL) 10 mg tablet, Take one tablet by mouth daily. Do not cut/ crush/ chew, Disp: 90 tablet, Rfl: 3  ?  traMADoL (ULTRAM) 50 mg tablet, Take one tablet by mouth every 8 hours as needed for Pain. Indications: pain, Disp: 30 tablet, Rfl: 0    ALLERGIES:   Allergies   Allergen Reactions   ? Sulfa (Sulfonamide Antibiotics) RASH and NAUSEA AND VOMITING     Allergy recorded in SMS: Sulfa

## 2020-06-02 ENCOUNTER — Encounter: Admit: 2020-06-02 | Discharge: 2020-06-02 | Payer: MEDICARE

## 2020-06-02 MED ORDER — METHYLPREDNISOLONE 4 MG PO DSPK
ORAL_TABLET | 0 refills | Status: AC
Start: 2020-06-02 — End: ?

## 2020-06-02 NOTE — Telephone Encounter
Above-noted  I reviewed the note of Dr. Christoper Fabian who saw her yesterday, I am fine trying her on Medrol and I am glad that he did not feel that this represents a malignant lesion  She should let me know how she feels after she finishes the course of Medrol Dosepak next week

## 2020-06-02 NOTE — Telephone Encounter
Pt called reporting increased swelling in right hand and right knee, in addition to pain. She also says that she is having facial swelling with red cheeks that started this morning. She reports she saw Dr. Christoper Fabian yesterday, she has a soft tissue mass on her right wrist that he recommended following up with rheumatologist for management, but also mentioned surgical debridement as a potential as well. Pt said that Dr. Christoper Fabian would be contacting our office for coordination. Pt reports aleve has not been helping, the muscle relaxants were helping at first but she is not having any improvement. She is continuing hcq and gabapentin. She says that ice and heat help sometimes. She reports she has had steroid tapers in the past that seemed to have help. Informed pt provider would be notified.

## 2020-06-02 NOTE — Telephone Encounter
Called pt back to inform her Dr. Jacquiline Doe had sent out a medrol dosepak, and instructed pt to follow directions on prescription and to let us know how she was feeling after she had finished dosepak. Pt verbalized understanding, denied other quesitons at this time.

## 2020-06-03 ENCOUNTER — Encounter: Admit: 2020-06-03 | Discharge: 2020-06-03 | Payer: MEDICARE

## 2020-06-03 ENCOUNTER — Ambulatory Visit: Admit: 2020-06-03 | Discharge: 2020-06-04 | Payer: MEDICARE

## 2020-06-03 DIAGNOSIS — F32A Depression: Secondary | ICD-10-CM

## 2020-06-03 DIAGNOSIS — Z853 Personal history of malignant neoplasm of breast: Secondary | ICD-10-CM

## 2020-06-03 DIAGNOSIS — Z1379 Encounter for other screening for genetic and chromosomal anomalies: Secondary | ICD-10-CM

## 2020-06-03 DIAGNOSIS — M1990 Unspecified osteoarthritis, unspecified site: Secondary | ICD-10-CM

## 2020-06-03 DIAGNOSIS — M810 Age-related osteoporosis without current pathological fracture: Secondary | ICD-10-CM

## 2020-06-03 DIAGNOSIS — E782 Mixed hyperlipidemia: Secondary | ICD-10-CM

## 2020-06-03 DIAGNOSIS — E785 Hyperlipidemia, unspecified: Secondary | ICD-10-CM

## 2020-06-03 DIAGNOSIS — E559 Vitamin D deficiency, unspecified: Secondary | ICD-10-CM

## 2020-06-03 DIAGNOSIS — C50212 Malignant neoplasm of upper-inner quadrant of left female breast: Secondary | ICD-10-CM

## 2020-06-03 DIAGNOSIS — Z9109 Other allergy status, other than to drugs and biological substances: Secondary | ICD-10-CM

## 2020-06-03 DIAGNOSIS — E162 Hypoglycemia, unspecified: Secondary | ICD-10-CM

## 2020-06-03 DIAGNOSIS — N816 Rectocele: Secondary | ICD-10-CM

## 2020-06-03 DIAGNOSIS — G629 Polyneuropathy, unspecified: Secondary | ICD-10-CM

## 2020-06-03 DIAGNOSIS — N8111 Cystocele, midline: Secondary | ICD-10-CM

## 2020-06-03 DIAGNOSIS — N39498 Other specified urinary incontinence: Secondary | ICD-10-CM

## 2020-06-03 DIAGNOSIS — K219 Gastro-esophageal reflux disease without esophagitis: Secondary | ICD-10-CM

## 2020-06-03 DIAGNOSIS — U071 COVID-19: Secondary | ICD-10-CM

## 2020-06-03 DIAGNOSIS — M3500 Sicca syndrome, unspecified: Secondary | ICD-10-CM

## 2020-06-03 DIAGNOSIS — M549 Dorsalgia, unspecified: Secondary | ICD-10-CM

## 2020-06-03 MED ORDER — CEFAZOLIN INJ 1GM IVP
2 g | Freq: Once | INTRAVENOUS | 0 refills
Start: 2020-06-03 — End: ?

## 2020-06-03 NOTE — Progress Notes
Date of Service: 06/03/2020     Subjective:             Kristen Norris is a 70 y.o. female who presents for urinary frequency/incontinence.    History of Present Illness  Kristen Norris is a 70 y.o. female with a history of urinary urgency, neurogenic bladder, hysterectomy with oophorectomy, breast neoplasm, rheumatoid arthritis, Sjogren's, and LBB  who presents for urinary urgency and frequency    She was last seen in Feb 2021 for the same concerns at which time she was started on Oxybutin and referred for pelvic floor PT. She reports multiple UTI's between February and April of 2021 following this visit. These were treated w abx and she was referred to GI by her PCP due to significant odor from her pads and incontinence as well. She had a colonoscopy to evaluate for the cause of her symptoms in Nov 2021. She was then referred to Dr. Randell Patient for uterovaginal prolapse and urinary incontinence. Of note, she is s/p hysterectomy. She notes that it feels like she has full tampon in her vagina at all times.   ?  She reports constant leaking. She goes through 4 pads a day and one depends at night. She is working on timed voiding about 10 times per day. She is still taking the oxybutynin which she reports is working well. She denies any blood in her urine or any recent UTI's.  She has not been doing pelvic floor exercises.    Pt reports increased bowel movements with intermittent constipation. She has significant diarrhea following constipation. Reports sometimes having to help manually push the rectal tissue from the inside of the vagina to help with bowel movements. She denies any blood in stool.     Medical History:   Diagnosis Date   ? Arthritis    ? Back pain 07/18/2007   ? Chest pain    ? COVID-19 12/28/2018   ? Depression    ? Elevated triglycerides with high cholesterol    ? Environmental allergies    ? 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 Hereditary Cancer Update Test - [place])   ? GERD (gastroesophageal reflux disease)    ? Hyperlipemia    ? Hypoglycemia    ? 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 Neuropathy    ? Osteoporosis 2/09    BMD  T-score (-2.3) spine and (-2.9) femur on Actonel   ? Personal history of malignant neoplasm of breast    ? PN (Peripheral Neuropathy)    ? Sjogren's disease (HCC)    ? Vitamin D deficiency 6/09       Surgical History:   Procedure Laterality Date   ? CARDIAC CATHERIZATION  03/28/2005    LAD 20%, Circ ostial 30%, EF 65%   ? HX BREAST LUMPECTOMY  06/07/05    Left with SLNBX (0/2)   ? COLONOSCOPY N/A 03/01/2010    normal ([name], [facility])   ? HX HYSTERECTOMY  03/24/2011    [name], MD [facility]/BSO   ? HX BLADDER SUSPENSION     ? HX TUBAL LIGATION         Family History   Problem Relation Age of Onset   ? Heart Disease Mother    ? Hypoglycemia Mother    ? Heart Disease  Father         farmer   ? Coronary Artery Disease Father    ? Heart Attack Brother 45   ? Other Brother 94        shot himself 06/30/2019   ? Complete Hysterectomy Sister         rotator cuff surgery   ? Other Sister 42        COVID complications   ? Diabetes Type II Maternal Grandmother    ? Cancer-Hematologic Daughter 36   ? COPD Daughter         on oxygen   ? Tobacco disorder Daughter         on Chantix   ? Complete Hysterectomy Daughter 35   ? None Reported Grandchild    ? Other Grandchild         wrestler-stomach problems   ? None Reported Grandchild    ? None Reported Grandchild    ? None Reported Grandchild    ? None Reported Grandchild    ? None Reported Sister    ? Heart murmur Sister    ? Heart Disease Sister    ? Osteopenia Sister    ? Depression Brother 56        hung himself 05/2017   ? Other Brother 0        died in utero-umbilical cord wrapped around him   ? Cancer-Breast Paternal Aunt 55        bilateral mastectomy   ? Cancer-Breast Other 60        paternal 1st cousin   ? Cancer-Lung Other 62        paternal 1st cousin   ? Cancer-Breast Other 40        paternal 1st cousin   ? Cancer-Ovarian Other 25   ? None Reported Other    ? Cancer-Breast Other 34        paternal 1st cousin   ? Brain Tumor Other 23        nephew   ? Cancer-Lung Paternal Uncle 45   ? Cervical Cancer Neg Hx    ? Cancer-Colon Neg Hx    ? Cancer-Uterine Neg Hx    ? Bleeding Disorders Neg Hx    ? Diabetes Neg Hx        Current Outpatient Medications   Medication Sig Dispense Refill   ? amitriptyline (ELAVIL) 25 mg tablet Take 25 mg by mouth at bedtime as needed.     ? aspirin EC 81 mg tablet Take 81 mg by mouth daily. Taking every 3 days     ? atorvastatin (LIPITOR) 20 mg tablet Take one tablet by mouth daily. 90 tablet 3   ? benzonatate (TESSALON PERLES) 100 mg capsule Take 100 mg by mouth every 8 hours as needed for Cough.     ? CALCIUM CARBONATE (CALCIUM 500 PO) Take 1 Cap by mouth Daily.     ? Cholecalciferol (VITAMIN D3) 125 mcg (5,000 unit) capsule Take 10,000 Units by mouth daily.     ? cyclobenzaprine (FLEXERIL) 5 mg tablet Take one tablet by mouth three times daily as needed for Muscle Cramps. 90 tablet 3   ? flecainide (TAMBOCOR) 50 mg tablet Take 1 tablet by mouth twice daily 60 tablet 11   ? gabapentin (NEURONTIN) 300 mg capsule TAKE 1 CAPSULE BY MOUTH THREE TIMES DAILY 90 capsule 1   ? hydrOXYchloroQUINE (PLAQUENIL) 200 mg tablet Take one tablet by mouth twice daily. Take with food. 180  tablet 3   ? methocarbamoL (ROBAXIN) 500 mg tablet Take two tablets by mouth four times daily. 60 tablet 0   ? methylPREDNIsolone (MEDROL (PAK)) 4 mg tablet Take medication as directed on package for 6 days. Take with food. 21 tablet 0   ? metoprolol XL (TOPROL XL) 50 mg extended release tablet Take 1 tablet by mouth once daily 30 tablet 11   ? oxybutynin XL (DITROPAN XL) 10 mg tablet TAKE 1 TABLET BY MOUTH ONCE DAILY DO  NOT  CUT  CRUSH  OR  CHEW 90 tablet 3   ? traMADoL (ULTRAM) 50 mg tablet Take one tablet by mouth every 8 hours as needed for Pain. Indications: pain 30 tablet 0     No current facility-administered medications for this visit.       Allergies   Allergen Reactions   ? Sulfa (Sulfonamide Antibiotics) RASH and NAUSEA AND VOMITING     Allergy recorded in SMS: Sulfa       Social History     Socioeconomic History   ? Marital status: Divorced     Spouse name: Not on file   ? Number of children: 2   ? Years of education: Not on file   ? Highest education level: GED or equivalent   Occupational History   ? Occupation: grounds Human resources officer: [name]   Tobacco Use   ? Smoking status: Former Smoker     Packs/day: 1.00     Years: 14.00     Pack years: 14.00     Types: Cigarettes     Quit date: 04/07/2000     Years since quitting: 20.1   ? Smokeless tobacco: Never Used   Vaping Use   ? Vaping Use: Never used   Substance and Sexual Activity   ? Alcohol use: Not Currently   ? Drug use: No   ? Sexual activity: Never   Other Topics Concern   ? Not on file   Social History Narrative   ? Not on file       Review of Systems   Constitutional: Negative for chills and fever.   Genitourinary: Positive for enuresis, frequency and urgency. Negative for difficulty urinating, dysuria, hematuria, pelvic pain and vaginal discharge.       Objective:         ? amitriptyline (ELAVIL) 25 mg tablet Take 25 mg by mouth at bedtime as needed.   ? aspirin EC 81 mg tablet Take 81 mg by mouth daily. Taking every 3 days   ? atorvastatin (LIPITOR) 20 mg tablet Take one tablet by mouth daily.   ? benzonatate (TESSALON PERLES) 100 mg capsule Take 100 mg by mouth every 8 hours as needed for Cough.   ? CALCIUM CARBONATE (CALCIUM 500 PO) Take 1 Cap by mouth Daily.   ? Cholecalciferol (VITAMIN D3) 125 mcg (5,000 unit) capsule Take 10,000 Units by mouth daily.   ? cyclobenzaprine (FLEXERIL) 5 mg tablet Take one tablet by mouth three times daily as needed for Muscle Cramps.   ? flecainide (TAMBOCOR) 50 mg tablet Take 1 tablet by mouth twice daily   ? gabapentin (NEURONTIN) 300 mg capsule TAKE 1 CAPSULE BY MOUTH THREE TIMES DAILY   ? hydrOXYchloroQUINE (PLAQUENIL) 200 mg tablet Take one tablet by mouth twice daily. Take with food.   ? methocarbamoL (ROBAXIN) 500 mg tablet Take two tablets by mouth four times daily.   ? methylPREDNIsolone (MEDROL (PAK)) 4 mg tablet Take  medication as directed on package for 6 days. Take with food.   ? metoprolol XL (TOPROL XL) 50 mg extended release tablet Take 1 tablet by mouth once daily   ? oxybutynin XL (DITROPAN XL) 10 mg tablet TAKE 1 TABLET BY MOUTH ONCE DAILY DO  NOT  CUT  CRUSH  OR  CHEW   ? traMADoL (ULTRAM) 50 mg tablet Take one tablet by mouth every 8 hours as needed for Pain. Indications: pain     Vitals:    06/03/20 0833   BP: 131/78   BP Source: Arm, Right Upper   Pulse: 58     There is no height or weight on file to calculate BMI.      PVR - 22 mL     UA WNL    Physical Exam  Constitutional:       Appearance: Normal appearance.   HENT:      Head: Normocephalic and atraumatic.   Pulmonary:      Effort: Pulmonary effort is normal.   Genitourinary:     Comments:  Perineal sensation intact and symmetric  1+ cystocele  2-3+ rectocele - more prominent on standing  No vaginal masses  Rectovaginal exam confirms findings - sphincter tone normal  Neurological:      General: No focal deficit present.      Mental Status: She is alert and oriented to person, place, and time.   Psychiatric:         Mood and Affect: Mood normal.         Behavior: Behavior normal.            Assessment and Plan:    Problem   Rectocele, Female    06/03/20 - pt has 2-3+ rectocele, more prominent on standing with symptoms of urinary incontinence.     Frequent Urinary Incontinence     Rectocele, female  - Pt continues to have urinary incontinence and feelings of vaginal fullness. 2-3+ rectocele on exam likely being the major contributor to pt's urinary symptoms.  - Continue Oxybutinin 10mg  qday  - Discussed surgical options for rectocele repair and risks/benefits of procedure. Pt would like to proceed with surgery. Pt has appointment with Cardiology 6/23 due to LBBB so need to confirm clearance with cardiology before scheduling surgical date.     Patient seen and discussed with Dr. Littie Deeds, who directed plan of care.    Rolanda Lundborg Mok  MS3    Orders Placed This Encounter   ? POC URINE DIPSTICK         GU STAFF NOTE    History and examination reviewed and key elements confirmed.  Discussed with the patient in detail and questions answered.    Patient with ongoing symptoms as outlined.  Exam shows a moderate rectocele and smaller cystocele.  She is interested in surgical repair.  Risks and benefits reviewed, and discussion about how procedure is performed, typical postoperative course, and ongoing care and questions answered.  She would like to proceed with this and it will be scheduled in near future.  She does have a cardiac history and will need to complete her pending MUGA scan and subsequent followup.  Will also need to be seen or counseled by PreAnesthesia testing clinic prior to surgery.  She was advised about request to do a Fleet's enema the evening prior to surgery as well since the plan is for a rectocele and cystocele repair.    I personally saw and evaluated the patient and determined  the plan of care.  I personally performed the key portions of the E&M visit, discussed the case with the resident and/or student, and concur with documentation of history, physical examination, assessment, and treatment plan unless otherwise noted.    Yizel Canby L. Littie Deeds, MD, MPH    Total visit time > 60 minutes including review of records, history, exam, counseling, coordination of care, and documentation.  Coding also based on visit complexity.

## 2020-06-03 NOTE — Progress Notes
06/03/20 PVR- 22 ml.

## 2020-06-03 NOTE — Patient Instructions
Urology: [facility]   Pre-Operative Instructions    Surgical Procedure: Cystocele and Rectocele Repair   Date of Surgery:  TBD  Arrival Time at the Admission Office (Main Lobby):  TBD    To ensure that your surgery can proceed without delay, you will be contacted by a phone triage nurse from the Preoperative Assessment Clinic [facility]) to complete this process.  Please review the information given to you by your surgeon.    You will be called by the surgery staff with your day of surgery arrival time between 2:30 - 4:30 PM the business day prior to surgery. If you have not heard from them after 4:30 PM, please call [phone] to confirm your arrival time.    Pre-Operative Assessment and Instructions:    Once you speak to the nurse or are seen in the Pre-Operative Assessment Clinic, you will be given medication instructions. However, if surgery is within 2 weeks, please read and follow the medication instructions below to prepare for surgery before you speak with the phone triage nurse:    DO NOT STOP your blood thinner until you have contacted your prescribing provider or have been specifically instructed to do so.    Starting Now:  ? Contact your provider who prescribes any of the following to develop a plan for surgery:  o Blood thinners such as aspirin, Aggrenox, Brilinta, Effient, Eliquis, enoxaparin (Lovenox), clopidogrel (Plavix), cilostazol, pentoxifylline (Trental), Pradaxa, Savaysa, ticlopidine, Xarelto, and warfarin (Coumadin)  o Immunosuppresants such as methotrexate, azathioprine, sulfasalazine, everolimus, sirolimus, Humira, Remicade, Enbrel, Simponi, Orencia, Cimzia, Actemra, and Xeljanz  o Chemotherapy    14 days prior to surgery:  ? Stop most vitamins, herbals, and supplements including (but not limited to):  o Alpha lipoic acid, black cohosh, CoQ10, echinacea, eye vitamins, fish oil, flaxseed oil, garlic, gingko biloba, ginseng, glucosamine/chondroitin, kava, Lovaza, lutein, lysine, multivitamin, red yeast rice, SAM-e, saw palmetto, St. John?s wort, turmeric, valerian root, Vascepa, Vitamin A, Vitamin B complex, Vitamin C, Vitamin E  - You DO NOT need to stop: iron, magnesium, potassium    7 days prior to surgery:  ? Stop anti-inflammatory medications such as ibuprofen (Advil, Motrin), naproxen (Aleve), Alka-Seltzer, Excedrin, Midol, celecoxib (Celebrex), diclofenac (Voltaren), diflunisal, etodolac, flurbiprofen, indomethacin, ketoprofen, ketorolac, meloxicam, nabumetone, and piroxicam    ========================================================================     !!!FLEETS ENEMA NIGHT BEFORE SURGERY!      Do not drink alcohol within 24 hours of surgery.    Please do not eat or drink anything after midnight.  No gum, mints, hard candy, snacks, coffee, etc or chewing tobacco allowed after midnight before surgery.  You may brush your teeth but be sure to rinse and spit.    Please shower with an over the counter antibacterial soap the evening before or the morning of surgery.    If your surgery is scheduled as an outpatient, you must arrange to have someone drive you home and have someone with you 24 hours after anesthesia.    If you stay in the hospital after surgery, you will be evaluated daily by your care team for appropriateness for discharge. You will be responsible for coordinating your transportation and home support for your recovery. If you do not know the needs for post-surgical support, please address this prior to surgery with your surgeon or your surgeon's nurse so you will have a safe transition home.     ========================================================================    If you have any questions, please contact your provider's office at [phone].  For emergencies during evenings, nights, weekends, and holidays, contact [facility] operator and request they contact the on-call Urology Resident at [phone].    =========================================================================

## 2020-06-04 DIAGNOSIS — N816 Rectocele: Secondary | ICD-10-CM

## 2020-06-05 ENCOUNTER — Encounter: Admit: 2020-06-05 | Discharge: 2020-06-05 | Payer: MEDICARE

## 2020-06-06 ENCOUNTER — Encounter: Admit: 2020-06-06 | Discharge: 2020-06-06 | Payer: MEDICARE

## 2020-06-06 MED ORDER — METOPROLOL SUCCINATE 50 MG PO TB24
ORAL_TABLET | Freq: Every day | 0 refills
Start: 2020-06-06 — End: ?

## 2020-06-24 ENCOUNTER — Encounter: Admit: 2020-06-24 | Discharge: 2020-06-24 | Payer: MEDICARE

## 2020-06-24 ENCOUNTER — Ambulatory Visit: Admit: 2020-06-24 | Discharge: 2020-06-24 | Payer: MEDICARE

## 2020-06-24 DIAGNOSIS — K219 Gastro-esophageal reflux disease without esophagitis: Secondary | ICD-10-CM

## 2020-06-24 DIAGNOSIS — Z853 Personal history of malignant neoplasm of breast: Secondary | ICD-10-CM

## 2020-06-24 DIAGNOSIS — U071 COVID-19: Secondary | ICD-10-CM

## 2020-06-24 DIAGNOSIS — M5414 Radiculopathy, thoracic region: Secondary | ICD-10-CM

## 2020-06-24 DIAGNOSIS — M3500 Sicca syndrome, unspecified: Secondary | ICD-10-CM

## 2020-06-24 DIAGNOSIS — R221 Localized swelling, mass and lump, neck: Secondary | ICD-10-CM

## 2020-06-24 DIAGNOSIS — E559 Vitamin D deficiency, unspecified: Secondary | ICD-10-CM

## 2020-06-24 DIAGNOSIS — Z9109 Other allergy status, other than to drugs and biological substances: Secondary | ICD-10-CM

## 2020-06-24 DIAGNOSIS — E782 Mixed hyperlipidemia: Secondary | ICD-10-CM

## 2020-06-24 DIAGNOSIS — G629 Polyneuropathy, unspecified: Secondary | ICD-10-CM

## 2020-06-24 DIAGNOSIS — E162 Hypoglycemia, unspecified: Secondary | ICD-10-CM

## 2020-06-24 DIAGNOSIS — C50212 Malignant neoplasm of upper-inner quadrant of left female breast: Secondary | ICD-10-CM

## 2020-06-24 DIAGNOSIS — E785 Hyperlipidemia, unspecified: Secondary | ICD-10-CM

## 2020-06-24 DIAGNOSIS — M1990 Unspecified osteoarthritis, unspecified site: Secondary | ICD-10-CM

## 2020-06-24 DIAGNOSIS — M549 Dorsalgia, unspecified: Secondary | ICD-10-CM

## 2020-06-24 DIAGNOSIS — M810 Age-related osteoporosis without current pathological fracture: Secondary | ICD-10-CM

## 2020-06-24 DIAGNOSIS — F32A Depression: Secondary | ICD-10-CM

## 2020-06-24 DIAGNOSIS — Z1379 Encounter for other screening for genetic and chromosomal anomalies: Secondary | ICD-10-CM

## 2020-06-24 MED ORDER — TRAMADOL 50 MG PO TAB
50 mg | ORAL_TABLET | ORAL | 0 refills | Status: AC | PRN
Start: 2020-06-24 — End: ?

## 2020-07-01 ENCOUNTER — Encounter: Admit: 2020-07-01 | Discharge: 2020-07-01 | Payer: MEDICARE

## 2020-07-01 ENCOUNTER — Ambulatory Visit: Admit: 2020-07-01 | Discharge: 2020-07-01 | Payer: MEDICARE

## 2020-07-01 DIAGNOSIS — R221 Localized swelling, mass and lump, neck: Secondary | ICD-10-CM

## 2020-07-02 ENCOUNTER — Encounter: Admit: 2020-07-02 | Discharge: 2020-07-02 | Payer: MEDICARE

## 2020-07-08 ENCOUNTER — Encounter: Admit: 2020-07-08 | Discharge: 2020-07-08 | Payer: MEDICARE

## 2020-07-08 MED ORDER — HYDROXYCHLOROQUINE 200 MG PO TAB
ORAL_TABLET | Freq: Two times a day (BID) | 0 refills
Start: 2020-07-08 — End: ?

## 2020-07-09 ENCOUNTER — Encounter: Admit: 2020-07-09 | Discharge: 2020-07-09 | Payer: MEDICARE

## 2020-07-09 ENCOUNTER — Ambulatory Visit: Admit: 2020-07-09 | Discharge: 2020-07-09 | Payer: MEDICARE

## 2020-07-09 DIAGNOSIS — G629 Polyneuropathy, unspecified: Secondary | ICD-10-CM

## 2020-07-09 DIAGNOSIS — F32A Depression: Secondary | ICD-10-CM

## 2020-07-09 DIAGNOSIS — C50212 Malignant neoplasm of upper-inner quadrant of left female breast: Secondary | ICD-10-CM

## 2020-07-09 DIAGNOSIS — E785 Hyperlipidemia, unspecified: Secondary | ICD-10-CM

## 2020-07-09 DIAGNOSIS — M5414 Radiculopathy, thoracic region: Secondary | ICD-10-CM

## 2020-07-09 DIAGNOSIS — K219 Gastro-esophageal reflux disease without esophagitis: Secondary | ICD-10-CM

## 2020-07-09 DIAGNOSIS — M549 Dorsalgia, unspecified: Secondary | ICD-10-CM

## 2020-07-09 DIAGNOSIS — E782 Mixed hyperlipidemia: Secondary | ICD-10-CM

## 2020-07-09 DIAGNOSIS — Z1379 Encounter for other screening for genetic and chromosomal anomalies: Secondary | ICD-10-CM

## 2020-07-09 DIAGNOSIS — Z853 Personal history of malignant neoplasm of breast: Secondary | ICD-10-CM

## 2020-07-09 DIAGNOSIS — M1990 Unspecified osteoarthritis, unspecified site: Secondary | ICD-10-CM

## 2020-07-09 DIAGNOSIS — E162 Hypoglycemia, unspecified: Secondary | ICD-10-CM

## 2020-07-09 DIAGNOSIS — U071 COVID-19: Secondary | ICD-10-CM

## 2020-07-09 DIAGNOSIS — Z9109 Other allergy status, other than to drugs and biological substances: Secondary | ICD-10-CM

## 2020-07-09 DIAGNOSIS — M3500 Sicca syndrome, unspecified: Secondary | ICD-10-CM

## 2020-07-09 DIAGNOSIS — E559 Vitamin D deficiency, unspecified: Secondary | ICD-10-CM

## 2020-07-09 DIAGNOSIS — M810 Age-related osteoporosis without current pathological fracture: Secondary | ICD-10-CM

## 2020-07-09 MED ORDER — TRIAMCINOLONE ACETONIDE 40 MG/ML IJ SUSP
80 mg | Freq: Once | EPIDURAL | 0 refills | Status: CP
Start: 2020-07-09 — End: ?
  Administered 2020-07-09: 14:00:00 80 mg via EPIDURAL

## 2020-07-09 MED ORDER — IOHEXOL 240 MG IODINE/ML IV SOLN
2.5 mL | Freq: Once | EPIDURAL | 0 refills | Status: CP
Start: 2020-07-09 — End: ?
  Administered 2020-07-09: 14:00:00 1 mL via EPIDURAL

## 2020-07-09 NOTE — Progress Notes
SPINE CENTER  INTERVENTIONAL PAIN PROCEDURE HISTORY AND PHYSICAL    Chief complaint: Pain    HISTORY OF PRESENT ILLNESS:  Patient returns today for interventional treatment of Back pain. Patient denies any recent fevers, chills, infection, antibiotics, coagulopathy or contra-indicated anticoagulants. Risks of the procedure were discussed including but not limited to bleeding, infection, damage to surrounding structures and reaction to medications. Patient reports understanding and has elected to proceed with the procedure.       Medical History:   Diagnosis Date   ? Arthritis    ? Back pain 07/18/2007   ? Chest pain    ? COVID-19 12/28/2018   ? Depression    ? Elevated triglycerides with high cholesterol    ? Environmental allergies    ? 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 Hereditary Cancer Update Test - [place])   ? GERD (gastroesophageal reflux disease)    ? Hyperlipemia    ? Hypoglycemia    ? 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 Neuropathy    ? Osteoporosis 2/09    BMD  T-score (-2.3) spine and (-2.9) femur on Actonel   ? Personal history of malignant neoplasm of breast    ? PN (Peripheral Neuropathy)    ? Sjogren's disease (HCC)    ? Vitamin D deficiency 6/09       Surgical History:   Procedure Laterality Date   ? CARDIAC CATHERIZATION  03/28/2005    LAD 20%, Circ ostial 30%, EF 65%   ? HX BREAST LUMPECTOMY  06/07/05    Left with SLNBX (0/2)   ? COLONOSCOPY N/A 03/01/2010    normal ([name], MD-[facility])   ? HX HYSTERECTOMY  03/24/2011    [name], MD [facility]/BSO   ? HX BLADDER SUSPENSION     ? HX TUBAL LIGATION         family history includes Brain Tumor (age of onset: 28) in an other family member; COPD in her daughter; Essie Christine (age of onset: 77) in an other family member; Physiological scientist (age of onset: 48) in an other family member; Physiological scientist (age of onset: 20) in her paternal aunt; Cancer-Breast (age of onset: 62) in an other family member; Cancer-Hematologic (age of onset: 3) in her daughter; Teresa Coombs (age of onset: 69) in her paternal uncle; Cancer-Lung (age of onset: 49) in an other family member; Cancer-Ovarian (age of onset: 19) in an other family member; Complete Hysterectomy in her sister; Complete Hysterectomy (age of onset: 71) in her daughter; Coronary Artery Disease in her father; Depression (age of onset: 80) in her brother; Diabetes Type II in her maternal grandmother; Heart Attack (age of onset: 4) in her brother; Heart Disease in her father, mother, and sister; Heart murmur in her sister; Hypoglycemia in her mother; None Reported in her grandchild, grandchild, grandchild, grandchild, grandchild, sister, and another family member; Osteopenia in her sister; Other in her grandchild; Other (age of onset: 0) in her brother; Other (age of onset: 103) in her brother; Other (age of onset: 84) in her sister; Tobacco disorder in her daughter.    Social History     Socioeconomic History   ? Marital status: Divorced   ? Number of children: 2   ? Highest education level: GED or equivalent   Occupational History   ? Occupation: grounds  Human resources officer: [name]   Tobacco Use   ? Smoking status: Former Smoker     Packs/day: 1.00     Years: 14.00     Pack years: 14.00     Types: Cigarettes     Quit date: 04/07/2000     Years since quitting: 20.2   ? Smokeless tobacco: Never Used   Vaping Use   ? Vaping Use: Never used   Substance and Sexual Activity   ? Alcohol use: Not Currently   ? Drug use: No   ? Sexual activity: Never       Allergies   Allergen Reactions   ? Sulfa (Sulfonamide Antibiotics) RASH and NAUSEA AND VOMITING     Allergy recorded in SMS: Sulfa       Vitals:    07/09/20 0832   BP: (!) 140/81   BP Source: Arm, Right Upper   Pulse: 54   Temp: 36.5 ?C (97.7 ?F)   Resp: 18   SpO2: 99%   TempSrc: Oral   PainSc: Zero   Weight: 70.8 kg (156 lb)   Height: 160 cm (5' 3)         REVIEW OF SYSTEMS: 10 point ROS obtained and negative except as above and below      PHYSICAL EXAM:  General: Alert, cooperative, no acute distress.  HEENT: Normocephalic, atraumatic.  Neck: Supple.  Lungs: Unlabored respirations, bilateral and equal chest excursion.  Heart: Regular rate.  Skin: Warm and dry to touch.  Abdomen: Nondistended.  MSK: No deformity.  Neurological: Alert and oriented x3.         IMPRESSION:    1. Thoracic radiculopathy         PLAN: Thoracic Epidural Steroid Injection T8-9    Lesli Albee, MD  PGY-5 Interventional Pain Medicine Fellow  Chronic Pain Consult Pager [phone]

## 2020-07-09 NOTE — Progress Notes

## 2020-07-09 NOTE — Patient Instructions
Procedure Completed Today: Thoracic Epidural Steroid Injection    Important information following your procedure today: You may drive today    1. Pain relief may not be immediate. It is possible you may even experience an increase in pain during the first 24-48 hours followed by a gradual decrease of your pain.  2. Though the procedure is generally safe and complications are rare, we do ask that you be aware of any of the following:   ? Any swelling, persistent redness, new bleeding, or drainage from the site of the injection.  ? You should not experience a severe headache.  ? You should not run a fever over 101? F.  ? New onset of sharp, severe back & or neck pain.  ? New onset of upper or lower extremity numbness or weakness.  ? New difficulty controlling bowel or bladder function after the injection.  ? New shortness of breath.    If any of these occur, please call to report this occurrence to a nurse at [phone]. If you are calling after 4:00 p.m., on weekends or holidays please call [phone] and ask to have the resident physician on call for the physician paged or go to your local emergency room.  3. You may experience soreness at the injection site. Ice can be applied at 20 minute intervals. Avoid application of direct heat, hot showers or hot tubs today.  4. Avoid strenuous activity today. You may resume your regular activities and exercise tomorrow.  5. Patients with diabetes may see an elevation in blood sugars for 7-10 days after the injection. It is important to pay close attention to your diet, check your blood sugars daily and report extreme elevations to the physician that treats your diabetes.  6. Patients taking a daily blood thinner can resume their regular dose this evening.  7. It is important that you take all medications ordered by your pain physician. Taking medication as ordered is an important part of your pain care plan. If you cannot continue the medication plan, please notify the physician.     Possible side effects to steroids that may occur:  ? Flushing or redness of the face  ? Irritability  ? Fluid retention  ? Change in women?s menses    The following medications were used: Lidocaine , Triamcinolone   and Contrast Dye

## 2020-07-09 NOTE — Procedures
Attending Surgeon: Jerilynn Som, MD    Anesthesia: Local    Pre-Procedure Diagnosis:   1. Thoracic radiculopathy        Post-Procedure Diagnosis:   1. Thoracic radiculopathy        Pain Score: Zero    [place] [facility] SPINE INJECT INTERLAM CRV/THRC  Procedure: epidural - interlaminar    Laterality: n/a   on 07/09/2020 8:45 AM  Location: thoracic -  T8-9      Consent:   Consent obtained: written  Consent given by: patient  Risks discussed: allergic reaction, bleeding, bruising, infection, no change or worsening in pain, reaction to medication and weakness  Alternatives discussed: alternative treatment, delayed treatment, no treatment and referral     Universal Protocol:  Relevant documents: relevant documents present and verified  Test results: test results available and properly labeled  Imaging studies: imaging studies available  Required items: required blood products, implants, devices, and special equipment available  Site marked: the operative site was marked  Patient identity confirmed: Patient identify confirmed verbally with patient.        Time out: Immediately prior to procedure a time out was called to verify the correct patient, procedure, equipment, support staff and site/side marked as required      Procedures Details:   Indications: pain   Prep: chlorhexidine  Patient position: prone  Estimated Blood Loss: minimal  Specimens: none  Number of Joints: 1  Approach: midline  Guidance: fluoroscopy  Contrast: Procedure confirmed with contrast under live fluoroscopy.  Needle and Epidural Catheter: tuohy  Needle size: 18 G  Injection procedure: Incremental injection, Negative aspiration for blood and Introduced needle  Patient tolerance: Patient tolerated the procedure well with no immediate complications. Pressure was applied, and hemostasis was accomplished.  Outcome: Pain improved  Comments: 4ml NS and 80mg  kenalog  No complications    This patient's clinical history, exam, AND imaging support radiculopathy AND there is a significant impact on quality of life and function AND their pain score has been documented in this note AND the pain has been present for at least 4 weeks AND they have failed to improve with noninvasive conservative care.   This patient had at least 50% pain relief for at least 3 months with the last epidural injection.    This patient's pain is so severe it results in a significant degree of disability. Prior ESI has provided at least a 50% improvement in pain and function for at least 3 months. The patient's Primary Care Physician has been notified of the continuation of this procedure and prolonged repeat steroid use. The patient does not desire surgery.      Estimated blood loss: none or minimal  Specimens: none  Patient tolerated the procedure well with no immediate complications. Pressure was applied, and hemostasis was accomplished.

## 2020-07-10 ENCOUNTER — Encounter: Admit: 2020-07-10 | Discharge: 2020-07-10 | Payer: MEDICARE

## 2020-07-16 ENCOUNTER — Encounter: Admit: 2020-07-16 | Discharge: 2020-07-16 | Payer: MEDICARE

## 2020-07-23 ENCOUNTER — Encounter: Admit: 2020-07-23 | Discharge: 2020-07-23 | Payer: MEDICARE

## 2020-07-24 ENCOUNTER — Encounter: Admit: 2020-07-24 | Discharge: 2020-07-24 | Payer: MEDICARE

## 2020-07-24 DIAGNOSIS — R931 Abnormal findings on diagnostic imaging of heart and coronary circulation: Secondary | ICD-10-CM

## 2020-07-24 DIAGNOSIS — I471 Supraventricular tachycardia: Secondary | ICD-10-CM

## 2020-07-24 MED ORDER — RP DX TC-99M RBC MCI
30 | Freq: Once | INTRAVENOUS | 0 refills | Status: CP
Start: 2020-07-24 — End: ?

## 2020-07-28 ENCOUNTER — Encounter: Admit: 2020-07-28 | Discharge: 2020-07-28 | Payer: MEDICARE

## 2020-07-28 NOTE — Telephone Encounter
Discussed MUGA results per TLR. Patient does not have any further questions at this time.

## 2020-07-28 NOTE — Telephone Encounter
-----   Message from Wynona Meals, RN sent at 07/28/2020  1:59 PM CDT -----    ----- Message -----  From: Shan Levans, MD  Sent: 07/28/2020   1:17 PM CDT  To: Binnie Kand, RN    Let pt know this looks pretty good.

## 2020-07-30 ENCOUNTER — Encounter: Admit: 2020-07-30 | Discharge: 2020-07-30 | Payer: MEDICARE

## 2020-07-30 DIAGNOSIS — M810 Age-related osteoporosis without current pathological fracture: Secondary | ICD-10-CM

## 2020-07-30 DIAGNOSIS — E162 Hypoglycemia, unspecified: Secondary | ICD-10-CM

## 2020-07-30 DIAGNOSIS — I447 Left bundle-branch block, unspecified: Secondary | ICD-10-CM

## 2020-07-30 DIAGNOSIS — E785 Hyperlipidemia, unspecified: Secondary | ICD-10-CM

## 2020-07-30 DIAGNOSIS — M549 Dorsalgia, unspecified: Secondary | ICD-10-CM

## 2020-07-30 DIAGNOSIS — U071 COVID-19: Secondary | ICD-10-CM

## 2020-07-30 DIAGNOSIS — M3500 Sicca syndrome, unspecified: Secondary | ICD-10-CM

## 2020-07-30 DIAGNOSIS — Z9109 Other allergy status, other than to drugs and biological substances: Secondary | ICD-10-CM

## 2020-07-30 DIAGNOSIS — M1990 Unspecified osteoarthritis, unspecified site: Secondary | ICD-10-CM

## 2020-07-30 DIAGNOSIS — E559 Vitamin D deficiency, unspecified: Secondary | ICD-10-CM

## 2020-07-30 DIAGNOSIS — C50212 Malignant neoplasm of upper-inner quadrant of left female breast: Secondary | ICD-10-CM

## 2020-07-30 DIAGNOSIS — I471 Supraventricular tachycardia: Secondary | ICD-10-CM

## 2020-07-30 DIAGNOSIS — G629 Polyneuropathy, unspecified: Secondary | ICD-10-CM

## 2020-07-30 DIAGNOSIS — Z1379 Encounter for other screening for genetic and chromosomal anomalies: Secondary | ICD-10-CM

## 2020-07-30 DIAGNOSIS — K219 Gastro-esophageal reflux disease without esophagitis: Secondary | ICD-10-CM

## 2020-07-30 DIAGNOSIS — Z853 Personal history of malignant neoplasm of breast: Secondary | ICD-10-CM

## 2020-07-30 DIAGNOSIS — E782 Mixed hyperlipidemia: Secondary | ICD-10-CM

## 2020-07-30 DIAGNOSIS — F32A Depression: Secondary | ICD-10-CM

## 2020-07-30 NOTE — Assessment & Plan Note
She had recent echo and MUGA showing low-normal ejection fraction.  She's considering urologic surgery and would be low risk from a cardiac standpoint.

## 2020-07-30 NOTE — Assessment & Plan Note
Symptoms seem stable on current medical therapy.

## 2020-07-30 NOTE — Progress Notes
Date of Service: 07/30/2020    Kristen Norris is a 70 y.o. female.       HPI     Kristen Norris was in the [place] clinic today for follow-up regarding SVT.  She is having only rare episodes of palpitations that last a few seconds.    In March she had an odd episode of very brief syncope while laying down watching TV with her great grandson.  She saw Beckie Salts who ordered some images studies, all of which came back looking OK.  Her EF is low end of normal, 45% by echo and 53% by MUGA.  ?  She gets tired toward the end of the day.  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  If she's bending over while working she'll get a little breathless, but otherwise OK.  ?  She isn't having chest discomfort.  She still has bilateral leg swelling, but is using compression stockings fairly regularly now.         Vitals:    07/30/20 0829   BP: 104/68   BP Source: Arm, Right Upper   Pulse: 65   SpO2: 96%   O2 Percent: 96 %   O2 Device: None (Room air)   PainSc: Zero   Weight: 71.7 kg (158 lb)   Height: 160 cm (5' 3)     Body mass index is 27.99 kg/m?Marland Kitchen     Past Medical History  Patient Active Problem List    Diagnosis Date Noted   ? Rectocele, female 06/03/2020     06/03/20 - pt has 2-3+ rectocele, more prominent on standing with symptoms of urinary incontinence.     ? Frequent urinary incontinence 06/03/2020   ? 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  2-week cardiac monitor shows episodes of fast SVT 3-20 seconds in duration.     ? Overactive bladder 10/31/2018   ? 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 Rheumatoid factor positive 02/23/2018   ? Arthralgia of right temporomandibular joint 02/23/2018   ? S/P hysterectomy with oophorectomy 07/15/2011   ? Pelvic pain in female 07/15/2011   ? Cystocele 07/15/2011   ? Left bundle branch block 03/17/2011   ? Preoperative evaluation to rule out surgical contraindication 03/17/2011   ? Abnormal electro-oculogram 03/17/2011   ? Ovarian cyst, left 01/05/2011   ? 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 Thoracic or lumbosacral neuritis or radiculitis, unspecified 04/18/2010   ? S/P Lumpectomy of Breast-Surgical Oncology Notes 12/17/2007     DIAGNOSIS:  Left breast Invasive Ductal Cancer diagnosed 3/07,  STAGE II-A  T2 N0 M0  Markers:  ER 100%, PR 0%, Ki67 15%, HER-2/neu negative.     PROCEDURE: Left lumpectomy with sentinel node biopsy 06/07/2005    HPI: This 70 year old postmenopausal female developed pain in the medial side of the left chest wall in 2/07.  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 She underwent left lumpectomy and sentinel lymph node sampling on 06/07/05. Lumpectomy revealed a 2.1 x 2-cm invasive ductal carcinoma, grade II. No angiolymphatic invasion. There was perineural invasion. 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 Margins were clear. 0/2 sentinel lymph nodes were involved.   Oncotype DX was sent and came back with a high recurrence score  of 33, which corresponds to a 10-year rate of distant recurrence of 23% with adjuvant tamoxifen alone. For adjuvant chemotherapy, she received four cycles of dose-dense AC and 10 weeks of taxol by Dr. Osborn Coho.  . Taxol had to be stopped at 10 weeks due to grade 3 peripheral neuropathy. She was placed on adjuvant Arimidex in 10/07.  Radiation therapy was given by Dr. Joycelyn Man in [place], [place] and completed in December 2007.   She was last seen in 12/2007 with c/o left axillary burning.  On exam there was a stable scar retraction at left lumpectomy site in left UIQ. No palpable mass, no nipple or areolar change. Right breast: palpable fullness with tenderness right UIQ most consistent with prominent pectoral muscle (patient works heavy labor as gardener). Left diagnostic mammogram 11/09 was class 2. Right breast with palpable tender fullness right upper breast/chest wall, benign clinically. Right breast sonogram also negative 11/09.  Follow-up exam 03/2008 assessment:  Right breast pain- negative clinical exam as well as negative Ultrasound. Findings and complaint most consistent with musculoskeletal etiology (patient works in Aeronautical engineer).  She now returns for a 3 month f/u exam to confirm this impression.          ? GERD (gastroesophageal reflux disease)    ? Depression    ? Environmental allergies    ? PN (Peripheral Neuropathy)    ? Hypoglycemia    ? Osteoporosis      BMD  T-score (-2.3) spine and (-2.9) femur on Actonel     ? Vitamin D deficiency    ? Back pain 07/18/2007     The patient is currently experiencing pain.      Location: middle back   Intensity (1-10): 6   Description: dull   Duration: constant  Fatigue (0-10): 7     ? Pain in joint, ankle and foot 10/05/2006   ? Malignant neoplasm of other specified sites of female breast 06/15/2006     DIAGNOSIS:  Left breast cancer, 3/07.     STAGE:  II-A;  T2N0M0, ER positive, HER-2/neu negative.     PAST THERAPY:  This 70 year old postmenopausal female developed pain in the medial side of the left chest wall in 2/07.  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 She underwent left lumpectomy and sentinel lymph node sampling on 06/07/05. Lumpectomy revealed a 2.1 x 2-cm invasive ductal carcinoma, grade II. No angiolymphatic invasion. There was perineural invasion. 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 Margins were clear. 0/2 sentinel lymph nodes were involved. Oncotype DX was sent and came back with a high recurrence score of 33, which corresponds to a 10-year rate of distant recurrence of 23% with adjuvant tamoxifen alone. For adjuvant chemotherapy, she received four cycles of dose-dense AC and 10 weeks of taxol. Taxol had to be stopped at 10 weeks due to grade 3 peripheral neuropathy. She was placed on adjuvant Arimidex in 10/07.  Radiation therapy was given in [place], [place] and completed in December 2007.    PRESENT THERAPY:  Adjuvant Arimidex since 11/24/05.         ? Edema 02/28/2006         Review of Systems   Constitutional: Positive for malaise/fatigue.   HENT: Negative.    Eyes: Negative.    Cardiovascular: Positive for dyspnea on exertion, irregular heartbeat, leg swelling and palpitations.   Respiratory: Negative.  Endocrine: Negative.    Hematologic/Lymphatic: Negative.    Skin: Positive for rash.   Musculoskeletal: Positive for neck pain.   Gastrointestinal: Negative.    Genitourinary: Negative.    Neurological: Positive for dizziness, headaches and light-headedness.   Psychiatric/Behavioral: Negative.    Allergic/Immunologic: Negative.        Physical Exam    Physical Exam   General Appearance: no distress   Skin: warm, no ulcers or xanthomas   Digits and Nails: no cyanosis or clubbing   Eyes: conjunctivae and lids normal, pupils are equal and round   Teeth/Gums/Palate: dentition unremarkable, no lesions   Lips & Oral Mucosa: no pallor or cyanosis   Neck Veins: normal JVP , neck veins are not distended   Thyroid: no nodules, masses, tenderness or enlargement   Chest Inspection: chest is normal in appearance   Respiratory Effort: breathing comfortably, no respiratory distress   Auscultation/Percussion: lungs clear to auscultation, no rales or rhonchi, no wheezing   PMI: PMI not enlarged or displaced   Cardiac Rhythm: regular rhythm and normal rate   Cardiac Auscultation: S1, S2 normal, no rub, no gallop   Murmurs: no murmur   Peripheral Circulation: normal peripheral circulation   Carotid Arteries: normal carotid upstroke bilaterally, no bruits   Radial Arteries: normal symmetric radial pulses   Abdominal Aorta: no abdominal aortic bruit   Pedal Pulses: normal symmetric pedal pulses   Lower Extremity Edema: no lower extremity edema   Abdominal Exam: soft, non-tender, no masses, bowel sounds normal   Liver & Spleen: no organomegaly   Gait & Station: walks without assistance   Muscle Strength: normal muscle tone   Orientation: oriented to time, place and person   Affect & Mood: appropriate and sustained affect   Language and Memory: patient responsive and seems to comprehend information   Neurologic Exam: neurological assessment grossly intact   Other: moves all extremities      Cardiovascular Health Factors  Vitals BP Readings from Last 3 Encounters:   07/30/20 104/68   07/09/20 121/76   06/24/20 131/75     Wt Readings from Last 3 Encounters:   07/30/20 71.7 kg (158 lb)   07/09/20 70.8 kg (156 lb)   06/24/20 70.8 kg (156 lb)     BMI Readings from Last 3 Encounters:   07/30/20 27.99 kg/m?   07/09/20 27.63 kg/m?   06/24/20 27.63 kg/m?      Smoking Social History     Tobacco Use   Smoking Status Former Smoker   ? Packs/day: 1.00   ? Years: 14.00   ? Pack years: 14.00   ? Types: Cigarettes   ? Quit date: 04/07/2000   ? Years since quitting: 20.3   Smokeless Tobacco Never Used      Lipid Profile Cholesterol   Date Value Ref Range Status   12/24/2019 150  Final     HDL   Date Value Ref Range Status   12/24/2019 42  Final     LDL   Date Value Ref Range Status   12/24/2019 67  Final     Triglycerides   Date Value Ref Range Status   12/24/2019 207 (H) <150 Final      Blood Sugar No results found for: HGBA1C  Glucose   Date Value Ref Range Status   05/13/2020 117 (H) 70 - 100 MG/DL Final   09/81/1914 782 (H) 70 - 105 Final   02/17/2020 95 70 - 100 MG/DL Final   95/62/1308 98  70 - 110 MG/DL Final   45/40/9811 914 70 - 110 MG/DL Final   78/29/5621 94 70 - 110 MG/DL Final     Glucose, POC   Date Value Ref Range Status   03/24/2011 117 (H) 70 - 100 MG/DL Final          Problems Addressed Today  Encounter Diagnoses   Name Primary?   ? PSVT (paroxysmal supraventricular tachycardia) (HCC)    ? Left bundle branch block        Assessment and Plan       PSVT (paroxysmal supraventricular tachycardia) (HCC)  Symptoms seem stable on current medical therapy.    Left bundle branch block  She had recent echo and MUGA showing low-normal ejection fraction.  She's considering urologic surgery and would be low risk from a cardiac standpoint.      Current Medications (including today's revisions)  ? atorvastatin (LIPITOR) 20 mg tablet Take one tablet by mouth daily.   ? benzonatate (TESSALON PERLES) 100 mg capsule Take 100 mg by mouth every 8 hours as needed for Cough.   ? CALCIUM CARBONATE (CALCIUM 500 PO) Take 1 Cap by mouth Daily.   ? Cholecalciferol (VITAMIN D3) 125 mcg (5,000 unit) capsule Take 10,000 Units by mouth daily.   ? ciprofloxacin (CIPRO) 500 mg tablet Take 500 mg by mouth every 12 hours.   ? flecainide (TAMBOCOR) 50 mg tablet Take 1 tablet by mouth twice daily   ? gabapentin (NEURONTIN) 300 mg capsule TAKE 1 CAPSULE BY MOUTH THREE TIMES DAILY   ? hydrOXYchloroQUINE (PLAQUENIL) 200 mg tablet Take 1 tablet by mouth twice daily with food   ? metoprolol XL (TOPROL XL) 50 mg extended release tablet Take 1 tablet by mouth once daily   ? oxybutynin XL (DITROPAN XL) 10 mg tablet TAKE 1 TABLET BY MOUTH ONCE DAILY DO  NOT  CUT  CRUSH  OR  CHEW   ? traMADoL (ULTRAM) 50 mg tablet Take one tablet by mouth every 8 hours as needed for Pain. Indications: pain     Total time spent on today's office visit was 30 minutes.  This includes face-to-face in person visit with patient as well as nonface-to-face time including review of the EMR, outside records, labs, radiologic studies, echocardiogram & other cardiovascular studies, formation of treatment plan, after visit summary, future disposition, and lastly on documentation.

## 2020-08-05 ENCOUNTER — Encounter: Admit: 2020-08-05 | Discharge: 2020-08-05 | Payer: MEDICARE

## 2020-08-06 ENCOUNTER — Encounter: Admit: 2020-08-06 | Discharge: 2020-08-06 | Payer: MEDICARE

## 2020-08-06 DIAGNOSIS — E162 Hypoglycemia, unspecified: Secondary | ICD-10-CM

## 2020-08-06 DIAGNOSIS — K219 Gastro-esophageal reflux disease without esophagitis: Secondary | ICD-10-CM

## 2020-08-06 DIAGNOSIS — M1990 Unspecified osteoarthritis, unspecified site: Secondary | ICD-10-CM

## 2020-08-06 DIAGNOSIS — E785 Hyperlipidemia, unspecified: Secondary | ICD-10-CM

## 2020-08-06 DIAGNOSIS — Z853 Personal history of malignant neoplasm of breast: Secondary | ICD-10-CM

## 2020-08-06 DIAGNOSIS — E559 Vitamin D deficiency, unspecified: Secondary | ICD-10-CM

## 2020-08-06 DIAGNOSIS — E782 Mixed hyperlipidemia: Secondary | ICD-10-CM

## 2020-08-06 DIAGNOSIS — C50212 Malignant neoplasm of upper-inner quadrant of left female breast: Secondary | ICD-10-CM

## 2020-08-06 DIAGNOSIS — Z9109 Other allergy status, other than to drugs and biological substances: Secondary | ICD-10-CM

## 2020-08-06 DIAGNOSIS — M549 Dorsalgia, unspecified: Secondary | ICD-10-CM

## 2020-08-06 DIAGNOSIS — M810 Age-related osteoporosis without current pathological fracture: Secondary | ICD-10-CM

## 2020-08-06 DIAGNOSIS — Z1379 Encounter for other screening for genetic and chromosomal anomalies: Secondary | ICD-10-CM

## 2020-08-06 DIAGNOSIS — U071 COVID-19: Secondary | ICD-10-CM

## 2020-08-06 DIAGNOSIS — F32A Depression: Secondary | ICD-10-CM

## 2020-08-06 DIAGNOSIS — M3500 Sicca syndrome, unspecified: Secondary | ICD-10-CM

## 2020-08-06 DIAGNOSIS — G629 Polyneuropathy, unspecified: Secondary | ICD-10-CM

## 2020-08-12 ENCOUNTER — Encounter: Admit: 2020-08-12 | Discharge: 2020-08-12 | Payer: MEDICARE

## 2020-08-12 MED ORDER — HYDROXYCHLOROQUINE 200 MG PO TAB
ORAL_TABLET | Freq: Two times a day (BID) | 0 refills
Start: 2020-08-12 — End: ?

## 2020-08-16 ENCOUNTER — Encounter: Admit: 2020-08-16 | Discharge: 2020-08-16 | Payer: MEDICARE

## 2020-08-17 ENCOUNTER — Encounter: Admit: 2020-08-17 | Discharge: 2020-08-17 | Payer: MEDICARE

## 2020-08-18 ENCOUNTER — Encounter: Admit: 2020-08-18 | Discharge: 2020-08-18 | Payer: MEDICARE

## 2020-08-18 ENCOUNTER — Ambulatory Visit: Admit: 2020-08-18 | Discharge: 2020-08-19 | Payer: MEDICARE

## 2020-08-18 DIAGNOSIS — G629 Polyneuropathy, unspecified: Secondary | ICD-10-CM

## 2020-08-18 DIAGNOSIS — M1990 Unspecified osteoarthritis, unspecified site: Secondary | ICD-10-CM

## 2020-08-18 DIAGNOSIS — Z853 Personal history of malignant neoplasm of breast: Secondary | ICD-10-CM

## 2020-08-18 DIAGNOSIS — E782 Mixed hyperlipidemia: Secondary | ICD-10-CM

## 2020-08-18 DIAGNOSIS — C50212 Malignant neoplasm of upper-inner quadrant of left female breast: Secondary | ICD-10-CM

## 2020-08-18 DIAGNOSIS — F32A Depression: Secondary | ICD-10-CM

## 2020-08-18 DIAGNOSIS — U071 COVID-19: Secondary | ICD-10-CM

## 2020-08-18 DIAGNOSIS — M810 Age-related osteoporosis without current pathological fracture: Secondary | ICD-10-CM

## 2020-08-18 DIAGNOSIS — M3500 Sicca syndrome, unspecified: Secondary | ICD-10-CM

## 2020-08-18 DIAGNOSIS — M549 Dorsalgia, unspecified: Secondary | ICD-10-CM

## 2020-08-18 DIAGNOSIS — E785 Hyperlipidemia, unspecified: Secondary | ICD-10-CM

## 2020-08-18 DIAGNOSIS — M7989 Other specified soft tissue disorders: Secondary | ICD-10-CM

## 2020-08-18 DIAGNOSIS — Z9109 Other allergy status, other than to drugs and biological substances: Secondary | ICD-10-CM

## 2020-08-18 DIAGNOSIS — Z1379 Encounter for other screening for genetic and chromosomal anomalies: Secondary | ICD-10-CM

## 2020-08-18 DIAGNOSIS — K219 Gastro-esophageal reflux disease without esophagitis: Secondary | ICD-10-CM

## 2020-08-18 DIAGNOSIS — E162 Hypoglycemia, unspecified: Secondary | ICD-10-CM

## 2020-08-18 DIAGNOSIS — E559 Vitamin D deficiency, unspecified: Secondary | ICD-10-CM

## 2020-08-18 NOTE — Telephone Encounter
Surgeon's office sent staff message regarding cardiac clearance for a lipoma removal of patient's back.     Pt denies chest pain, rare palpitations. Slight shortness of breath with work and bending over, but otherwise no complaints.     Pt not on any blood thinners at this time.    Last OV 6/23 in [place]. From your OV:  "Left bundle branch block  She had recent echo and MUGA showing low-normal ejection fraction.  She's considering urologic surgery and would be low risk from a cardiac standpoint."    Will route to [facility] to confirm ok to proceed with this surgery.

## 2020-08-18 NOTE — Telephone Encounter
-----   Message from Neill Loft, RN sent at 08/18/2020  3:09 PM CDT -----  Regarding: Cardiac Clearance Dr. Ricard Dillon - Surgery Center location  Good afternoon,     Dr. Mordecai Rasmussen saw Kristen Norris in clinic for a lipoma on her back.  We are requesting cardiac clearance from Dr. Ricard Dillon.  She would be under general anesthetic, prone position, and surgery would be at [facility].  Does Dr. Ricard Dillon see any contraindications to proceed or have risk stratification to provide?    Thanks,  Jarrett Soho, RN

## 2020-08-18 NOTE — Patient Instructions
Your care team:      Dr. Elisabeth Most, MD  Luna Glasgow, APRN-NP               Nurse Practitioner   Dahlia Client, RN, BSN  Tresa Endo, RN, BSN  Charlynne Pander, RN, BSN  Sophie, RN, BSN              Clinical Nurse Coordinators (CNC)                    Mychart:   - We strongly recommend signing up for Mychart, our patient access portal, and sending [place] non-urgent questions/concerns through this portal. We will reply within one business day.   - This is also a great resource to view all of your labs and scan results.   - Please feel free to ask our scheduler upon checkout for help setting up your Mychart access.       **For patients needing to establish care with a primary care provider within the [facility] network, please call [facility] Scheduling: [phone]     Phone numbers:      Direct Scheduling Line; [phone]  Direct Nurse Line: [phone]  Fax Number: [phone]     Please call nurse line (listed above) if you have questions or concerns during business hours.   Messages left on nurses line are checked Monday through Friday between the hours of 8:00 AM and 4:00 PM. Messages left after 3:00 pm will be returned the following business day.     Evening (after 4:00 PM), weekends, and holiday on-call: [phone]   For urgent needs after hours, please ask for the On-Call General Surgeon to be paged.      Notes:   - Allow 7-10 business days for our office to complete any requested paperwork (FMLA, etc.)   - Allow two to three business days for all medication refills. Please call your pharmacy first to check for available refills.           Department of General Surgery  Discharge instructions    Excision of Lipoma  A lipoma is a growth made of fatty tissue. It is not cancer (benign). It appears as a soft lump. A lipoma may be removed (excised) because you don?t like how it looks. Or it may be removed if it is painful or growing.    Post-Operative Activity  ? A responsible adult must drive you home.  After receiving sedation or anesthesia, you should not drive, operate heavy machinery or do anything that requires concentration for at least 24 hours after receiving sedation.  ? We recommend that you avoid activities that would put tension on your incision.    Post-Operative Site Care  ? Okay to shower and get the incision sites wet after 24 hours, do not submerge in water    Diet  ? Immediately after surgery, you may begin with liquids and advance diet slowly.    Home Medications  ? You may resume your home medications, unless the doctor has specifically asked that you stop a certain medication.    Pain Management  ? Pain medication will be prescribed during the post-operative period.  Please take medication as prescribed.  Do not take pain medication on an empty stomach.  ? No driving or operating heavy machinery while on pain medication.  ? Avoid alcohol while on pain medication.  ? Pain medications cause constipation, please obtain the following over the counter medications prior to surgery.  o Please begin taking Senokot if  taking pain medication.  o If no bowel movement in 48 hours take Senokot and Milk of Magnesia daily until bowel movement.  ? You may take over the counter pain relieving medication as needed for break through pain.    ? If you choose not to take the prescribed pain medication, please use over the counter medications for pain control.    ? For over the counter medications, please use Tylenol (Acetaminophen) or Ibuprofen.  Please call nurse with the following symptoms [phone]:  ? If pain is not controlled with pain medication.  ? No Bowel movement in 4 days  ? Signs or symptoms of infection  o Chills, body aches, fever greater than 101.5  o Redness, Swelling, Warmth at or around incision sites  o Purulent drainage from incision sites    If you feel you are having an emergency, do not hesitate to call 911

## 2020-08-19 ENCOUNTER — Encounter: Admit: 2020-08-19 | Discharge: 2020-08-19 | Payer: MEDICARE

## 2020-08-19 ENCOUNTER — Ambulatory Visit: Admit: 2020-08-19 | Discharge: 2020-08-19 | Payer: MEDICARE

## 2020-08-19 DIAGNOSIS — F32A Depression: Secondary | ICD-10-CM

## 2020-08-19 DIAGNOSIS — U071 COVID-19: Secondary | ICD-10-CM

## 2020-08-19 DIAGNOSIS — K219 Gastro-esophageal reflux disease without esophagitis: Secondary | ICD-10-CM

## 2020-08-19 DIAGNOSIS — Z1379 Encounter for other screening for genetic and chromosomal anomalies: Secondary | ICD-10-CM

## 2020-08-19 DIAGNOSIS — G629 Polyneuropathy, unspecified: Secondary | ICD-10-CM

## 2020-08-19 DIAGNOSIS — M810 Age-related osteoporosis without current pathological fracture: Secondary | ICD-10-CM

## 2020-08-19 DIAGNOSIS — C50212 Malignant neoplasm of upper-inner quadrant of left female breast: Secondary | ICD-10-CM

## 2020-08-19 DIAGNOSIS — E162 Hypoglycemia, unspecified: Secondary | ICD-10-CM

## 2020-08-19 DIAGNOSIS — M5416 Radiculopathy, lumbar region: Secondary | ICD-10-CM

## 2020-08-19 DIAGNOSIS — M1990 Unspecified osteoarthritis, unspecified site: Secondary | ICD-10-CM

## 2020-08-19 DIAGNOSIS — E785 Hyperlipidemia, unspecified: Secondary | ICD-10-CM

## 2020-08-19 DIAGNOSIS — E782 Mixed hyperlipidemia: Secondary | ICD-10-CM

## 2020-08-19 DIAGNOSIS — Z853 Personal history of malignant neoplasm of breast: Secondary | ICD-10-CM

## 2020-08-19 DIAGNOSIS — E559 Vitamin D deficiency, unspecified: Secondary | ICD-10-CM

## 2020-08-19 DIAGNOSIS — Z9109 Other allergy status, other than to drugs and biological substances: Secondary | ICD-10-CM

## 2020-08-19 DIAGNOSIS — M549 Dorsalgia, unspecified: Secondary | ICD-10-CM

## 2020-08-19 DIAGNOSIS — M3500 Sicca syndrome, unspecified: Secondary | ICD-10-CM

## 2020-08-19 DIAGNOSIS — R221 Localized swelling, mass and lump, neck: Secondary | ICD-10-CM

## 2020-08-19 MED ORDER — DULOXETINE 30 MG PO CPDR
ORAL_CAPSULE | Freq: Every day | ORAL | 1 refills | 60.00000 days | Status: AC
Start: 2020-08-19 — End: ?

## 2020-08-20 ENCOUNTER — Encounter: Admit: 2020-08-20 | Discharge: 2020-08-20 | Payer: MEDICARE

## 2020-08-24 ENCOUNTER — Encounter: Admit: 2020-08-24 | Discharge: 2020-08-24 | Payer: MEDICARE

## 2020-08-24 ENCOUNTER — Ambulatory Visit: Admit: 2020-08-24 | Discharge: 2020-08-24 | Payer: MEDICARE

## 2020-08-24 DIAGNOSIS — D170 Benign lipomatous neoplasm of skin and subcutaneous tissue of head, face and neck: Secondary | ICD-10-CM

## 2020-08-24 MED ORDER — CEFAZOLIN 1 GRAM IJ SOLR
2 g | Freq: Once | INTRAVENOUS | 0 refills
Start: 2020-08-24 — End: ?

## 2020-08-25 ENCOUNTER — Encounter: Admit: 2020-08-25 | Discharge: 2020-08-25 | Payer: MEDICARE

## 2020-08-25 DIAGNOSIS — Z1379 Encounter for other screening for genetic and chromosomal anomalies: Secondary | ICD-10-CM

## 2020-08-25 DIAGNOSIS — G629 Polyneuropathy, unspecified: Secondary | ICD-10-CM

## 2020-08-25 DIAGNOSIS — M549 Dorsalgia, unspecified: Secondary | ICD-10-CM

## 2020-08-25 DIAGNOSIS — U071 COVID-19: Secondary | ICD-10-CM

## 2020-08-25 DIAGNOSIS — E559 Vitamin D deficiency, unspecified: Secondary | ICD-10-CM

## 2020-08-25 DIAGNOSIS — Z9109 Other allergy status, other than to drugs and biological substances: Secondary | ICD-10-CM

## 2020-08-25 DIAGNOSIS — E162 Hypoglycemia, unspecified: Secondary | ICD-10-CM

## 2020-08-25 DIAGNOSIS — M1990 Unspecified osteoarthritis, unspecified site: Secondary | ICD-10-CM

## 2020-08-25 DIAGNOSIS — Z853 Personal history of malignant neoplasm of breast: Secondary | ICD-10-CM

## 2020-08-25 DIAGNOSIS — E782 Mixed hyperlipidemia: Secondary | ICD-10-CM

## 2020-08-25 DIAGNOSIS — C50212 Malignant neoplasm of upper-inner quadrant of left female breast: Secondary | ICD-10-CM

## 2020-08-25 DIAGNOSIS — E785 Hyperlipidemia, unspecified: Secondary | ICD-10-CM

## 2020-08-25 DIAGNOSIS — F32A Depression: Secondary | ICD-10-CM

## 2020-08-25 DIAGNOSIS — K219 Gastro-esophageal reflux disease without esophagitis: Secondary | ICD-10-CM

## 2020-08-25 DIAGNOSIS — M3500 Sicca syndrome, unspecified: Secondary | ICD-10-CM

## 2020-08-25 DIAGNOSIS — M810 Age-related osteoporosis without current pathological fracture: Secondary | ICD-10-CM

## 2020-08-26 ENCOUNTER — Encounter: Admit: 2020-08-26 | Discharge: 2020-08-26 | Payer: MEDICARE

## 2020-08-27 ENCOUNTER — Encounter: Admit: 2020-08-27 | Discharge: 2020-08-27 | Payer: MEDICARE

## 2020-08-27 ENCOUNTER — Ambulatory Visit: Admit: 2020-08-27 | Discharge: 2020-08-27 | Payer: MEDICARE

## 2020-08-27 DIAGNOSIS — M3500 Sicca syndrome, unspecified: Secondary | ICD-10-CM

## 2020-08-27 DIAGNOSIS — C50212 Malignant neoplasm of upper-inner quadrant of left female breast: Secondary | ICD-10-CM

## 2020-08-27 DIAGNOSIS — M549 Dorsalgia, unspecified: Secondary | ICD-10-CM

## 2020-08-27 DIAGNOSIS — E559 Vitamin D deficiency, unspecified: Secondary | ICD-10-CM

## 2020-08-27 DIAGNOSIS — F32A Depression: Secondary | ICD-10-CM

## 2020-08-27 DIAGNOSIS — G629 Polyneuropathy, unspecified: Secondary | ICD-10-CM

## 2020-08-27 DIAGNOSIS — Z1379 Encounter for other screening for genetic and chromosomal anomalies: Secondary | ICD-10-CM

## 2020-08-27 DIAGNOSIS — E162 Hypoglycemia, unspecified: Secondary | ICD-10-CM

## 2020-08-27 DIAGNOSIS — M5416 Radiculopathy, lumbar region: Secondary | ICD-10-CM

## 2020-08-27 DIAGNOSIS — M810 Age-related osteoporosis without current pathological fracture: Secondary | ICD-10-CM

## 2020-08-27 DIAGNOSIS — E785 Hyperlipidemia, unspecified: Secondary | ICD-10-CM

## 2020-08-27 DIAGNOSIS — E782 Mixed hyperlipidemia: Secondary | ICD-10-CM

## 2020-08-27 DIAGNOSIS — U071 COVID-19: Secondary | ICD-10-CM

## 2020-08-27 DIAGNOSIS — Z9109 Other allergy status, other than to drugs and biological substances: Secondary | ICD-10-CM

## 2020-08-27 DIAGNOSIS — K219 Gastro-esophageal reflux disease without esophagitis: Secondary | ICD-10-CM

## 2020-08-27 DIAGNOSIS — M1990 Unspecified osteoarthritis, unspecified site: Secondary | ICD-10-CM

## 2020-08-27 DIAGNOSIS — Z853 Personal history of malignant neoplasm of breast: Secondary | ICD-10-CM

## 2020-08-27 MED ORDER — METHYLPREDNISOLONE ACETATE 80 MG/ML IJ SUSP
80 mg | Freq: Once | INTRAMUSCULAR | 0 refills | Status: CP
Start: 2020-08-27 — End: ?
  Administered 2020-08-27: 20:00:00 80 mg via INTRAMUSCULAR

## 2020-08-27 MED ORDER — IOHEXOL 240 MG IODINE/ML IV SOLN
2.5 mL | Freq: Once | EPIDURAL | 0 refills | Status: CP
Start: 2020-08-27 — End: ?
  Administered 2020-08-27: 20:00:00 1 mL via EPIDURAL

## 2020-08-27 NOTE — Patient Instructions
Procedure Completed Today: Lumbar Transforaminal Steroid Injection    Important information following your procedure today: You may drive today    1. Pain relief may not be immediate. It is possible you may even experience an increase in pain during the first 24-48 hours followed by a gradual decrease of your pain.  2. Though the procedure is generally safe and complications are rare, we do ask that you be aware of any of the following:   ? Any swelling, persistent redness, new bleeding, or drainage from the site of the injection.  ? You should not experience a severe headache.  ? You should not run a fever over 101? F.  ? New onset of sharp, severe back & or neck pain.  ? New onset of upper or lower extremity numbness or weakness.  ? New difficulty controlling bowel or bladder function after the injection.  ? New shortness of breath.    If any of these occur, please call to report this occurrence to a nurse at [phone]. If you are calling after 4:00 p.m., on weekends or holidays please call [phone] and ask to have the resident physician on call for the physician paged or go to your local emergency room.  3. You may experience soreness at the injection site. Ice can be applied at 20 minute intervals. Avoid application of direct heat, hot showers or hot tubs today.  4. Avoid strenuous activity today. You may resume your regular activities and exercise tomorrow.  5. Patients with diabetes may see an elevation in blood sugars for 7-10 days after the injection. It is important to pay close attention to your diet, check your blood sugars daily and report extreme elevations to the physician that treats your diabetes.  6. Patients taking a daily blood thinner can resume their regular dose this evening.  7. It is important that you take all medications ordered by your pain physician. Taking medication as ordered is an important part of your pain care plan. If you cannot continue the medication plan, please notify the physician.     Possible side effects to steroids that may occur:  ? Flushing or redness of the face  ? Irritability  ? Fluid retention  ? Change in women?s menses    The following medications were used: Depomedrol and Contrast Dye

## 2020-08-27 NOTE — Progress Notes

## 2020-08-31 ENCOUNTER — Encounter: Admit: 2020-08-31 | Discharge: 2020-08-31 | Payer: MEDICARE

## 2020-08-31 DIAGNOSIS — N816 Rectocele: Secondary | ICD-10-CM

## 2020-08-31 DIAGNOSIS — N8111 Cystocele, midline: Secondary | ICD-10-CM

## 2020-09-03 ENCOUNTER — Encounter: Admit: 2020-09-03 | Discharge: 2020-09-03 | Payer: MEDICARE

## 2020-09-03 NOTE — Anesthesia Pre-Procedure Evaluation
Anesthesia Pre-Procedure Evaluation    Name: Kristen Norris      MRN: [id number]     DOB: 10/13/1950     Age: 70 y.o.     Sex: female   _________________________________________________________________________     Procedure Info:   Procedure Information     Date/Time: 09/04/20 0840    Procedure: Excision Posterior Neck mass (N/A )    Location: [facility] / [facility] OR    Surgeons: Carloyn Manner., MD          Physical Assessment  Vital Signs (last filed in past 24 hours):         Patient History   Allergies   Allergen Reactions   ? Sulfa (Sulfonamide Antibiotics) RASH and NAUSEA AND VOMITING     Allergy recorded in SMS: Sulfa        Current Medications    Medication Directions   atorvastatin (LIPITOR) 20 mg tablet Take one tablet by mouth daily.   benzonatate (TESSALON PERLES) 100 mg capsule Take 100 mg by mouth every 8 hours as needed for Cough.   CALCIUM CARBONATE (CALCIUM 500 PO) Take 1 Cap by mouth Daily.   Cholecalciferol (VITAMIN D3) 125 mcg (5,000 unit) capsule Take 10,000 Units by mouth daily.   duloxetine DR (CYMBALTA) 30 mg capsule 30mg  daily x 2 weeks then increase to 60mg  daily  Indications: neuropathic pain   flecainide (TAMBOCOR) 50 mg tablet Take 1 tablet by mouth twice daily   hydrOXYchloroQUINE (PLAQUENIL) 200 mg tablet Take 1 tablet by mouth twice daily with food   metoprolol XL (TOPROL XL) 50 mg extended release tablet Take 1 tablet by mouth once daily   oxybutynin XL (DITROPAN XL) 10 mg tablet TAKE 1 TABLET BY MOUTH ONCE DAILY DO  NOT  CUT  CRUSH  OR  CHEW         Review of Systems/Medical History      Patient summary reviewed  Nursing notes reviewed  Pertinent labs reviewed    PONV Screening: Female gender, Non-smoker and Postoperative opioids  No history of anesthetic complications  No family history of anesthetic complications      Airway - negative        Pulmonary - negative          Cardiovascular         Exercise tolerance: >4 METS       Beta Blocker therapy: Yes      Beta blockers within 24 hours: Yes      Dysrhythmias (h/o PSVT and known LBBB)      Hyperlipidemia      Pt denies any h/o MI, CHF or significant arrhythmias.  Pt denies any CP or SOB with > 4 metabolic equivalents.      6/22?Cards  Breast?ca?with?chemo:  In?March?she?had?an?odd?episode?of?very?brief?syncope?while?laying?down?watching?TV?with?her?great?grandson.??She?saw?Tom?Rosamond?who?ordered?some?images?studies,?all?of?which?came?back?looking?OK.??Her?EF?is?low?end?of?normal,?45%?by?echo?and?53%?by?MUGA.  ?  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  ?  She?isn't?having?chest?discomfort.??She?still?has?bilateral?leg?swelling,?but?is?using?compression?stockings?fairly?regularly?now.  ?  Assessment?and?Plan  ?  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  ?  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    Nuc stress test 07/2020:  SUMMARY/OPINION: This gated blood pool study shows a calculated left ventricular ejection fraction of 53% with abnormal septal motion and mild and inferoseptal hypokinesis consistent with the patient's known history of left bundle branch block.  Right ventricular chamber size and function appears to be qualitatively normal.      GI/Hepatic/Renal           GERD, well controlled      Neuro/Psych         Neuropathy  Psychiatric history          Depression      Musculoskeletal         Back pain      Arthritis      Endocrine/Other         Malignancy (h/o breast cancer)    Constitution - negative   Physical Exam    Airway Findings      Mallampati: II      TM distance: >3 FB      Neck ROM: full      Mouth opening: good      Airway patency: adequate    Dental Findings: Negative      Cardiovascular Findings: Negative      Rhythm: regular      Rate: normal    Pulmonary Findings: Negative      Breath sounds clear to auscultation.    Neurological Findings: Negative      Constitutional findings: Negative       Diagnostic Tests  Hematology:   Lab Results   Component Value Date    HGB 14.2 05/13/2020    HCT 43.3 05/13/2020    PLTCT 218 05/13/2020    WBC 5.9 05/13/2020    NEUT 65 05/13/2020    ANC 3.89 05/13/2020    LYMPH 35 09/06/2005    ALC 1.50 05/13/2020    MONA 7 05/13/2020    AMC 0.39 05/13/2020    EOSA 1 05/13/2020    ABC 0.03 05/13/2020    BASOPHILS 2 09/06/2005    MCV 91.3 05/13/2020    MCH 30.0 05/13/2020    MCHC 32.9 05/13/2020    MPV 8.5 05/13/2020    RDW 14.7 05/13/2020         General Chemistry:   Lab Results   Component Value Date    NA 145 05/13/2020    K 3.9 05/13/2020    CL 107 05/13/2020    CO2 29 05/13/2020    GAP 9 05/13/2020    BUN 19 05/13/2020    CR 0.87 05/13/2020    GLU 117 05/13/2020    GLU 98 09/23/2005    CA 9.1 05/13/2020    ALBUMIN 4.1 05/13/2020    MG 2.1 04/28/2020    TOTBILI 0.4 05/13/2020      Coagulation:   Lab Results   Component Value Date    PT 12.2 01/23/2006    PTT 24.7 01/23/2006    INR 1.1 01/23/2006         Anesthesia Plan    ASA score: 3   Plan: general  Induction method: intravenous  NPO status: acceptable      Informed Consent  Anesthetic plan and risks discussed with patient.  Use of blood products discussed with patient      Plan discussed with: CRNA.  Comments: (Surgeon requesting GA)

## 2020-09-04 ENCOUNTER — Encounter: Admit: 2020-09-04 | Discharge: 2020-09-04 | Payer: MEDICARE

## 2020-09-04 ENCOUNTER — Ambulatory Visit: Admit: 2020-09-04 | Discharge: 2020-09-04 | Payer: MEDICARE

## 2020-09-04 DIAGNOSIS — M1990 Unspecified osteoarthritis, unspecified site: Secondary | ICD-10-CM

## 2020-09-04 DIAGNOSIS — M810 Age-related osteoporosis without current pathological fracture: Secondary | ICD-10-CM

## 2020-09-04 DIAGNOSIS — K219 Gastro-esophageal reflux disease without esophagitis: Secondary | ICD-10-CM

## 2020-09-04 DIAGNOSIS — C50212 Malignant neoplasm of upper-inner quadrant of left female breast: Secondary | ICD-10-CM

## 2020-09-04 DIAGNOSIS — E162 Hypoglycemia, unspecified: Secondary | ICD-10-CM

## 2020-09-04 DIAGNOSIS — Z9109 Other allergy status, other than to drugs and biological substances: Secondary | ICD-10-CM

## 2020-09-04 DIAGNOSIS — M3500 Sicca syndrome, unspecified: Secondary | ICD-10-CM

## 2020-09-04 DIAGNOSIS — G629 Polyneuropathy, unspecified: Secondary | ICD-10-CM

## 2020-09-04 DIAGNOSIS — E785 Hyperlipidemia, unspecified: Secondary | ICD-10-CM

## 2020-09-04 DIAGNOSIS — E559 Vitamin D deficiency, unspecified: Secondary | ICD-10-CM

## 2020-09-04 DIAGNOSIS — U071 COVID-19: Secondary | ICD-10-CM

## 2020-09-04 DIAGNOSIS — F32A Depression: Secondary | ICD-10-CM

## 2020-09-04 DIAGNOSIS — M549 Dorsalgia, unspecified: Secondary | ICD-10-CM

## 2020-09-04 DIAGNOSIS — E782 Mixed hyperlipidemia: Secondary | ICD-10-CM

## 2020-09-04 DIAGNOSIS — Z1379 Encounter for other screening for genetic and chromosomal anomalies: Secondary | ICD-10-CM

## 2020-09-04 DIAGNOSIS — Z853 Personal history of malignant neoplasm of breast: Secondary | ICD-10-CM

## 2020-09-04 MED ORDER — FENTANYL CITRATE (PF) 50 MCG/ML IJ SOLN
INTRAVENOUS | 0 refills | Status: DC
Start: 2020-09-04 — End: 2020-09-04

## 2020-09-04 MED ORDER — LIDOCAINE (PF) 200 MG/10 ML (2 %) IJ SYRG
INTRAVENOUS | 0 refills | Status: DC
Start: 2020-09-04 — End: 2020-09-04

## 2020-09-04 MED ORDER — PROPOFOL INJ 10 MG/ML IV VIAL
INTRAVENOUS | 0 refills | Status: DC
Start: 2020-09-04 — End: 2020-09-04

## 2020-09-04 MED ORDER — ROCURONIUM 10 MG/ML IV SOLN
INTRAVENOUS | 0 refills | Status: DC
Start: 2020-09-04 — End: 2020-09-04

## 2020-09-04 MED ORDER — SUGAMMADEX 100 MG/ML IV SOLN
INTRAVENOUS | 0 refills | Status: DC
Start: 2020-09-04 — End: 2020-09-04

## 2020-09-04 MED ORDER — DEXAMETHASONE SODIUM PHOSPHATE 4 MG/ML IJ SOLN
INTRAVENOUS | 0 refills | Status: DC
Start: 2020-09-04 — End: 2020-09-04

## 2020-09-04 MED ORDER — ONDANSETRON HCL (PF) 4 MG/2 ML IJ SOLN
INTRAVENOUS | 0 refills | Status: DC
Start: 2020-09-04 — End: 2020-09-04

## 2020-09-04 MED ORDER — MIDAZOLAM 1 MG/ML IJ SOLN
INTRAVENOUS | 0 refills | Status: DC
Start: 2020-09-04 — End: 2020-09-04

## 2020-09-04 MED FILL — HYDROCODONE-ACETAMINOPHEN 5-325 MG PO TAB: 5/325 mg | ORAL | 2 days supply | Qty: 15 | Fill #1 | Status: CP

## 2020-09-07 ENCOUNTER — Encounter: Admit: 2020-09-07 | Discharge: 2020-09-07 | Payer: MEDICARE

## 2020-09-07 DIAGNOSIS — E785 Hyperlipidemia, unspecified: Secondary | ICD-10-CM

## 2020-09-07 DIAGNOSIS — M1990 Unspecified osteoarthritis, unspecified site: Secondary | ICD-10-CM

## 2020-09-07 DIAGNOSIS — K219 Gastro-esophageal reflux disease without esophagitis: Secondary | ICD-10-CM

## 2020-09-07 DIAGNOSIS — F32A Depression: Secondary | ICD-10-CM

## 2020-09-07 DIAGNOSIS — M549 Dorsalgia, unspecified: Secondary | ICD-10-CM

## 2020-09-07 DIAGNOSIS — E559 Vitamin D deficiency, unspecified: Secondary | ICD-10-CM

## 2020-09-07 DIAGNOSIS — Z1379 Encounter for other screening for genetic and chromosomal anomalies: Secondary | ICD-10-CM

## 2020-09-07 DIAGNOSIS — M3500 Sicca syndrome, unspecified: Secondary | ICD-10-CM

## 2020-09-07 DIAGNOSIS — U071 COVID-19: Secondary | ICD-10-CM

## 2020-09-07 DIAGNOSIS — Z9109 Other allergy status, other than to drugs and biological substances: Secondary | ICD-10-CM

## 2020-09-07 DIAGNOSIS — E782 Mixed hyperlipidemia: Secondary | ICD-10-CM

## 2020-09-07 DIAGNOSIS — G629 Polyneuropathy, unspecified: Secondary | ICD-10-CM

## 2020-09-07 DIAGNOSIS — Z853 Personal history of malignant neoplasm of breast: Secondary | ICD-10-CM

## 2020-09-07 DIAGNOSIS — C50212 Malignant neoplasm of upper-inner quadrant of left female breast: Secondary | ICD-10-CM

## 2020-09-07 DIAGNOSIS — E162 Hypoglycemia, unspecified: Secondary | ICD-10-CM

## 2020-09-07 DIAGNOSIS — M810 Age-related osteoporosis without current pathological fracture: Secondary | ICD-10-CM

## 2020-09-12 ENCOUNTER — Encounter: Admit: 2020-09-12 | Discharge: 2020-09-12 | Payer: MEDICARE

## 2020-09-14 ENCOUNTER — Encounter: Admit: 2020-09-14 | Discharge: 2020-09-14 | Payer: MEDICARE

## 2020-09-14 NOTE — Telephone Encounter
[place] eye exam records received from [facility], exam done by Dr. Abram Sander. Pt was last seen 08/25/2020.         [place] to [place].

## 2020-09-16 ENCOUNTER — Encounter: Admit: 2020-09-16 | Discharge: 2020-09-16 | Payer: MEDICARE

## 2020-09-16 MED ORDER — HYDROXYCHLOROQUINE 200 MG PO TAB
ORAL_TABLET | Freq: Two times a day (BID) | 0 refills
Start: 2020-09-16 — End: ?

## 2020-09-22 ENCOUNTER — Encounter: Admit: 2020-09-22 | Discharge: 2020-09-22 | Payer: MEDICARE

## 2020-09-22 ENCOUNTER — Ambulatory Visit: Admit: 2020-09-22 | Discharge: 2020-09-22 | Payer: MEDICARE

## 2020-09-22 DIAGNOSIS — F32A Depression: Secondary | ICD-10-CM

## 2020-09-22 DIAGNOSIS — Z9109 Other allergy status, other than to drugs and biological substances: Secondary | ICD-10-CM

## 2020-09-22 DIAGNOSIS — M1990 Unspecified osteoarthritis, unspecified site: Secondary | ICD-10-CM

## 2020-09-22 DIAGNOSIS — Z1379 Encounter for other screening for genetic and chromosomal anomalies: Secondary | ICD-10-CM

## 2020-09-22 DIAGNOSIS — M810 Age-related osteoporosis without current pathological fracture: Secondary | ICD-10-CM

## 2020-09-22 DIAGNOSIS — E162 Hypoglycemia, unspecified: Secondary | ICD-10-CM

## 2020-09-22 DIAGNOSIS — M549 Dorsalgia, unspecified: Secondary | ICD-10-CM

## 2020-09-22 DIAGNOSIS — K219 Gastro-esophageal reflux disease without esophagitis: Secondary | ICD-10-CM

## 2020-09-22 DIAGNOSIS — C50212 Malignant neoplasm of upper-inner quadrant of left female breast: Secondary | ICD-10-CM

## 2020-09-22 DIAGNOSIS — E785 Hyperlipidemia, unspecified: Secondary | ICD-10-CM

## 2020-09-22 DIAGNOSIS — U071 COVID-19: Secondary | ICD-10-CM

## 2020-09-22 DIAGNOSIS — E559 Vitamin D deficiency, unspecified: Secondary | ICD-10-CM

## 2020-09-22 DIAGNOSIS — M3500 Sicca syndrome, unspecified: Secondary | ICD-10-CM

## 2020-09-22 DIAGNOSIS — Z9889 Other specified postprocedural states: Secondary | ICD-10-CM

## 2020-09-22 DIAGNOSIS — G629 Polyneuropathy, unspecified: Secondary | ICD-10-CM

## 2020-09-22 DIAGNOSIS — Z853 Personal history of malignant neoplasm of breast: Secondary | ICD-10-CM

## 2020-09-22 DIAGNOSIS — E782 Mixed hyperlipidemia: Secondary | ICD-10-CM

## 2020-09-22 NOTE — Progress Notes
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  Patient reports that she is doing well overall.  She continues to have some soreness in her trapezius muscles and a seroma where the lipoma was resected Tolerating diet.  Denies fever or chills.  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    Objective:     BP 135/66 (BP Source: Arm, Left Upper, Patient Position: Sitting)  - Pulse 58  - Temp 36.2 C (97.2 F) (Temporal)  - Resp 16  - Ht 160 cm (5' 2.99")  - Wt 64.6 kg (142 lb 6.4 oz)  - LMP  (LMP Unknown)  - SpO2 96%  - BMI 25.23 kg/m     General:  alert and no distress   Incision:   healing well, no drainage, no erythema, incision is well-healed.  Sterile post operative seroma is present.  No erythema or induration.           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 Instructed on incision care.  She will roll up a towel and rest her upper back/neck on that when she is supine or watching tv  2. Discussed increasing activities as tolerated  3. RTC 1 month    Nickola Major, [place]  Pager [phone]    [facility]  Department of [place]  [address]  [place], [place]  [id number]

## 2020-09-23 ENCOUNTER — Encounter: Admit: 2020-09-23 | Discharge: 2020-09-23 | Payer: MEDICARE

## 2020-09-23 ENCOUNTER — Ambulatory Visit: Admit: 2020-09-23 | Discharge: 2020-09-23 | Payer: MEDICARE

## 2020-09-23 DIAGNOSIS — K219 Gastro-esophageal reflux disease without esophagitis: Secondary | ICD-10-CM

## 2020-09-23 DIAGNOSIS — M3500 Sicca syndrome, unspecified: Secondary | ICD-10-CM

## 2020-09-23 DIAGNOSIS — E559 Vitamin D deficiency, unspecified: Secondary | ICD-10-CM

## 2020-09-23 DIAGNOSIS — U071 COVID-19: Secondary | ICD-10-CM

## 2020-09-23 DIAGNOSIS — E785 Hyperlipidemia, unspecified: Secondary | ICD-10-CM

## 2020-09-23 DIAGNOSIS — F32A Depression: Secondary | ICD-10-CM

## 2020-09-23 DIAGNOSIS — G629 Polyneuropathy, unspecified: Secondary | ICD-10-CM

## 2020-09-23 DIAGNOSIS — Z853 Personal history of malignant neoplasm of breast: Secondary | ICD-10-CM

## 2020-09-23 DIAGNOSIS — M810 Age-related osteoporosis without current pathological fracture: Secondary | ICD-10-CM

## 2020-09-23 DIAGNOSIS — M549 Dorsalgia, unspecified: Secondary | ICD-10-CM

## 2020-09-23 DIAGNOSIS — M1990 Unspecified osteoarthritis, unspecified site: Secondary | ICD-10-CM

## 2020-09-23 DIAGNOSIS — Z9109 Other allergy status, other than to drugs and biological substances: Secondary | ICD-10-CM

## 2020-09-23 DIAGNOSIS — C50212 Malignant neoplasm of upper-inner quadrant of left female breast: Secondary | ICD-10-CM

## 2020-09-23 DIAGNOSIS — Z1379 Encounter for other screening for genetic and chromosomal anomalies: Secondary | ICD-10-CM

## 2020-09-23 DIAGNOSIS — E162 Hypoglycemia, unspecified: Secondary | ICD-10-CM

## 2020-09-23 DIAGNOSIS — E782 Mixed hyperlipidemia: Secondary | ICD-10-CM

## 2020-09-28 ENCOUNTER — Encounter: Admit: 2020-09-28 | Discharge: 2020-09-28 | Payer: MEDICARE

## 2020-10-02 ENCOUNTER — Encounter: Admit: 2020-10-02 | Discharge: 2020-10-02 | Payer: MEDICARE

## 2020-10-02 ENCOUNTER — Ambulatory Visit: Admit: 2020-10-02 | Discharge: 2020-10-02 | Payer: MEDICARE

## 2020-10-02 DIAGNOSIS — F32A Depression: Secondary | ICD-10-CM

## 2020-10-02 DIAGNOSIS — Z853 Personal history of malignant neoplasm of breast: Secondary | ICD-10-CM

## 2020-10-02 DIAGNOSIS — E782 Mixed hyperlipidemia: Secondary | ICD-10-CM

## 2020-10-02 DIAGNOSIS — U071 COVID-19: Secondary | ICD-10-CM

## 2020-10-02 DIAGNOSIS — R29898 Other symptoms and signs involving the musculoskeletal system: Secondary | ICD-10-CM

## 2020-10-02 DIAGNOSIS — K219 Gastro-esophageal reflux disease without esophagitis: Secondary | ICD-10-CM

## 2020-10-02 DIAGNOSIS — M810 Age-related osteoporosis without current pathological fracture: Secondary | ICD-10-CM

## 2020-10-02 DIAGNOSIS — G629 Polyneuropathy, unspecified: Secondary | ICD-10-CM

## 2020-10-02 DIAGNOSIS — Z9109 Other allergy status, other than to drugs and biological substances: Secondary | ICD-10-CM

## 2020-10-02 DIAGNOSIS — E162 Hypoglycemia, unspecified: Secondary | ICD-10-CM

## 2020-10-02 DIAGNOSIS — M1990 Unspecified osteoarthritis, unspecified site: Secondary | ICD-10-CM

## 2020-10-02 DIAGNOSIS — C50212 Malignant neoplasm of upper-inner quadrant of left female breast: Secondary | ICD-10-CM

## 2020-10-02 DIAGNOSIS — M549 Dorsalgia, unspecified: Secondary | ICD-10-CM

## 2020-10-02 DIAGNOSIS — E559 Vitamin D deficiency, unspecified: Secondary | ICD-10-CM

## 2020-10-02 DIAGNOSIS — M48062 Spinal stenosis, lumbar region with neurogenic claudication: Secondary | ICD-10-CM

## 2020-10-02 DIAGNOSIS — E785 Hyperlipidemia, unspecified: Secondary | ICD-10-CM

## 2020-10-02 DIAGNOSIS — M5137 Other intervertebral disc degeneration, lumbosacral region: Secondary | ICD-10-CM

## 2020-10-02 DIAGNOSIS — Z1379 Encounter for other screening for genetic and chromosomal anomalies: Secondary | ICD-10-CM

## 2020-10-02 DIAGNOSIS — M3500 Sicca syndrome, unspecified: Secondary | ICD-10-CM

## 2020-10-02 MED ORDER — TRAMADOL 50 MG PO TAB
50 mg | ORAL_TABLET | ORAL | 0 refills | Status: AC | PRN
Start: 2020-10-02 — End: ?

## 2020-10-02 NOTE — Progress Notes
SPINE CENTER CLINIC NOTE       SUBJECTIVE:   Chronic back pain and bil lower ext pain and increased weakness   She has had falls since her last visit 09/23/20  Reports her legs gave out   She had 2 episodes of severe weakness   Not having significant pain at this time     Hx of injections   Finds moderate relief of pain   Did have some numbness in her legs after the procudure but this was transient   Now her leg weakness, heaviness and wobbly feeling Is newer and worse   Using a walking stick because she is afraid of falling again        Review of Systems   Musculoskeletal: Positive for arthralgias and back pain.   All other systems reviewed and are negative.      Current Outpatient Medications:   ?  atorvastatin (LIPITOR) 20 mg tablet, Take one tablet by mouth daily., Disp: 90 tablet, Rfl: 3  ?  benzonatate (TESSALON PERLES) 100 mg capsule, Take 100 mg by mouth every 8 hours as needed for Cough., Disp: , Rfl:   ?  CALCIUM CARBONATE (CALCIUM 500 PO), Take 1 Cap by mouth Daily., Disp: , Rfl:   ?  Cholecalciferol (VITAMIN D3) 125 mcg (5,000 unit) capsule, Take 10,000 Units by mouth daily., Disp: , Rfl:   ?  duloxetine DR (CYMBALTA) 30 mg capsule, 30mg  daily x 2 weeks then increase to 60mg  daily  Indications: neuropathic pain, Disp: 60 capsule, Rfl: 1  ?  flecainide (TAMBOCOR) 50 mg tablet, Take 1 tablet by mouth twice daily, Disp: 60 tablet, Rfl: 11  ?  hydrOXYchloroQUINE (PLAQUENIL) 200 mg tablet, Take 1 tablet by mouth twice daily with food, Disp: 180 tablet, Rfl: 2  ?  metoprolol XL (TOPROL XL) 50 mg extended release tablet, Take 1 tablet by mouth once daily, Disp: 30 tablet, Rfl: 11  ?  oxybutynin XL (DITROPAN XL) 10 mg tablet, TAKE 1 TABLET BY MOUTH ONCE DAILY DO  NOT  CUT  CRUSH  OR  CHEW, Disp: 90 tablet, Rfl: 3  ?  senna/docusate (SENOKOT-S) 8.6/50 mg tablet, Take one tablet by mouth twice daily. Indications: constipation, Disp: 30 tablet, Rfl: 0  ?  traMADoL (ULTRAM) 50 mg tablet, Take one tablet by mouth every 4 hours as needed for Pain. Indications: neuropathic pain, pain, Disp: 30 tablet, Rfl: 0  Allergies   Allergen Reactions   ? Sulfa (Sulfonamide Antibiotics) RASH and NAUSEA AND VOMITING     Allergy recorded in SMS: Sulfa     Physical Exam  Vitals:    10/02/20 0848   BP: 137/83   BP Source: Arm, Left Upper   Pulse: 58   SpO2: 96%   PainSc: Five   Weight: 63 kg (139 lb)   Height: 157.5 cm (5' 2)     Oswestry Total Score:: 34  Pain Score: Five  Body mass index is 25.42 kg/m?Marland Kitchen    General: Alert, oriented, mild distress  HEENT: normocephalic  NECK: supple  Resp: Non labored breathing, no distress  Cardio: Pedal pulses palpable bilaterally, mild lower extremity edema  MS: TTP in lumbar region of spine and paraspinal muscles.  NEURO: Cranial nerves II-XII intact. Motor strength with bil lower ext weakness, sensory exams with deficits to bil le.  Gait antalgic with walking stick .   Behavior: Calm, cooperative, behavior and speech normal.           IMPRESSION:  1.  Weakness of both lower extremities    2. Degeneration of lumbar or lumbosacral intervertebral disc    3. Lumbar stenosis with neurogenic claudication          PLAN:    Plan referral to surgeon for eval of falls   Will inquire about updated imaging - will dicsuss with surgeon office first   Keep other future appts as scheduled for now   F.u after surgery referral to discuss options

## 2020-10-15 ENCOUNTER — Encounter: Admit: 2020-10-15 | Discharge: 2020-10-15 | Payer: MEDICARE

## 2020-10-15 NOTE — Telephone Encounter
Call from patient, she states she is wanting to see Dr. Jacquiline Doe as she has had changes in her condition and does not see him until February.    She states she lost sensation in her left leg and has fallen because of this. Sensation was regained, however she requires a walker. She is continuing to see the spine center, she has not seen a neurologist in several years.     Patient currently on wait-list for an appointment, advised she could make an appointment with a nurse practitioner sooner if she would like.    Advised patient she should also see if she would be able to get in with her previous neurologist. Informed her that Dr. Jacquiline Doe was out of the office for the week but that he would be updated once he returned.

## 2020-10-19 ENCOUNTER — Encounter: Admit: 2020-10-19 | Discharge: 2020-10-19 | Payer: MEDICARE

## 2020-10-19 ENCOUNTER — Ambulatory Visit: Admit: 2020-10-19 | Discharge: 2020-10-19 | Payer: MEDICARE

## 2020-10-19 DIAGNOSIS — U071 COVID-19: Secondary | ICD-10-CM

## 2020-10-19 DIAGNOSIS — E785 Hyperlipidemia, unspecified: Secondary | ICD-10-CM

## 2020-10-19 DIAGNOSIS — M810 Age-related osteoporosis without current pathological fracture: Secondary | ICD-10-CM

## 2020-10-19 DIAGNOSIS — G629 Polyneuropathy, unspecified: Secondary | ICD-10-CM

## 2020-10-19 DIAGNOSIS — E162 Hypoglycemia, unspecified: Secondary | ICD-10-CM

## 2020-10-19 DIAGNOSIS — E782 Mixed hyperlipidemia: Secondary | ICD-10-CM

## 2020-10-19 DIAGNOSIS — M818 Other osteoporosis without current pathological fracture: Secondary | ICD-10-CM

## 2020-10-19 DIAGNOSIS — E559 Vitamin D deficiency, unspecified: Secondary | ICD-10-CM

## 2020-10-19 DIAGNOSIS — M549 Dorsalgia, unspecified: Secondary | ICD-10-CM

## 2020-10-19 DIAGNOSIS — Z9109 Other allergy status, other than to drugs and biological substances: Secondary | ICD-10-CM

## 2020-10-19 DIAGNOSIS — M3500 Sicca syndrome, unspecified: Secondary | ICD-10-CM

## 2020-10-19 DIAGNOSIS — Z1379 Encounter for other screening for genetic and chromosomal anomalies: Secondary | ICD-10-CM

## 2020-10-19 DIAGNOSIS — K219 Gastro-esophageal reflux disease without esophagitis: Secondary | ICD-10-CM

## 2020-10-19 DIAGNOSIS — C50212 Malignant neoplasm of upper-inner quadrant of left female breast: Secondary | ICD-10-CM

## 2020-10-19 DIAGNOSIS — F32A Depression: Secondary | ICD-10-CM

## 2020-10-19 DIAGNOSIS — Z853 Personal history of malignant neoplasm of breast: Secondary | ICD-10-CM

## 2020-10-19 DIAGNOSIS — M1990 Unspecified osteoarthritis, unspecified site: Secondary | ICD-10-CM

## 2020-10-19 MED ORDER — ZOLEDRONIC ACID-MANNITOL-WATER 5 MG/100 ML IV PGBK
5 mg | Freq: Once | INTRAVENOUS | 0 refills
Start: 2020-10-19 — End: ?

## 2020-10-19 NOTE — Progress Notes
Date of Service: 10/19/2020    Subjective:             Kristen Norris is a 70 y.o. female.    History of Present Illness    ?  Chief complaint: Management of seronegative Sjogren's syndrome  ?  Primary Rheumatologist:  Dr. Cecelia Byars  - Last seen 05/13/20  ?  Interval history:    She missed her last appointment with Dr. Cecelia Byars.   She is wondering about getting another dose of Reclast. Her last infusion was in June 2021.   She is seen in the spine clinic for back pain.   She has been having numbness and frequent falls and when she was seen in spine clinic they recommended seeing surgery. She will see Dr. Jean Rosenthal in October 2022.   She has been using a cane to help ambulate.   She continues to take HCQ 200mg  BID.  She has occasional swelling in her wrists, but it has been awhile since this has happened.   Dryness symptoms have improved. She has been using the eye gel at night and ey drops during the day. She uses biotene for oral dryness.          Review of Systems   Constitutional: Positive for fatigue.   Eyes: Positive for redness and itching.   Endocrine: Positive for cold intolerance.        Excessive dry mouth   Genitourinary: Positive for difficulty urinating.   Musculoskeletal: Positive for arthralgias, back pain and gait problem.   Neurological: Positive for numbness.   Hematological: Bruises/bleeds easily.   All other systems reviewed and are negative.        Objective:         ? atorvastatin (LIPITOR) 20 mg tablet Take one tablet by mouth daily.   ? benzonatate (TESSALON PERLES) 100 mg capsule Take 100 mg by mouth every 8 hours as needed for Cough.   ? CALCIUM CARBONATE (CALCIUM 500 PO) Take 1 Cap by mouth Daily.   ? Cholecalciferol (VITAMIN D3) 125 mcg (5,000 unit) capsule Take 10,000 Units by mouth daily.   ? duloxetine DR (CYMBALTA) 30 mg capsule 30mg  daily x 2 weeks then increase to 60mg  daily  Indications: neuropathic pain   ? flecainide (TAMBOCOR) 50 mg tablet Take 1 tablet by mouth twice daily   ? hydrOXYchloroQUINE (PLAQUENIL) 200 mg tablet Take 1 tablet by mouth twice daily with food   ? metoprolol XL (TOPROL XL) 50 mg extended release tablet Take 1 tablet by mouth once daily   ? oxybutynin XL (DITROPAN XL) 10 mg tablet TAKE 1 TABLET BY MOUTH ONCE DAILY DO  NOT  CUT  CRUSH  OR  CHEW   ? senna/docusate (SENOKOT-S) 8.6/50 mg tablet Take one tablet by mouth twice daily. Indications: constipation   ? traMADoL (ULTRAM) 50 mg tablet Take one tablet by mouth every 4 hours as needed for Pain. Indications: neuropathic pain, pain     Vitals:    10/19/20 1428   BP: 138/78   BP Source: Arm, Right Upper   Pulse: 65   Temp: 36.8 ?C (98.3 ?F)   Resp: 16   SpO2: 99%   TempSrc: Temporal   PainSc: Five   Weight: 63 kg (139 lb)   Height: 157.5 cm (5' 2)     Body mass index is 25.42 kg/m?Marland Kitchen     Physical Exam  Constitutional:       Appearance: She is well-developed.   HENT:  Mouth/Throat:      Pharynx: No oropharyngeal exudate.   Eyes:      General:         Right eye: No discharge.         Left eye: No discharge.      Conjunctiva/sclera: Conjunctivae normal.   Cardiovascular:      Rate and Rhythm: Normal rate and regular rhythm.   Pulmonary:      Effort: Pulmonary effort is normal.      Breath sounds: Normal breath sounds. No wheezing or rales.   Musculoskeletal:      Cervical back: Normal range of motion.      Comments: No synovitis or tenderness in upper or lower extremities. She is using a cane for ambulation.    Lymphadenopathy:      Cervical: No cervical adenopathy.   Skin:     General: Skin is warm and dry.      Findings: No rash.              Assessment and Plan:    1.  Sjogren's disease, she fulfills 2016 classification criteria based on the presence of sicca syndrome, p myalgia and arthralgia, objective evidence of dry eyes and dry mouth, positive ANA and rheumatoid factor and a positive lip biopsy with focus score of 2  She started hydroxychloroquine in August 2020.    Sjogren's disease appears in good control. She has been having more low back pain and lower extremity numbness and weakness. She has already been referred to ortho/surgery and neurology.   ?  2. Osteoporosis: she was on prolia but could not afford it after 2 doses, Fosamax was started in January 2021 but stopped in May due to epigastric pain, she received the first dose of Reclast in June 2021. She is overdue for Reclast - will reschedule her next dose at check out today.   ?  3. Long term Hydroxychloroquine use since mid 2020, last eye exam Feb 2022   ?  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  5. Breast cancer: Diagnosed in 2007, in remission since     ?  Plan:    1. Reschedule Reclast infusion  2. Follow-up with spine ortho and neurology as scheduled    Follow-up with Dr. Cecelia Byars in February 2022 as previously scheduled.Waymon Budge APRN, FNP-BC    30 minutes spent on this patient's encounter with counseling and coordination of care taking >50% of the visit.

## 2020-10-20 ENCOUNTER — Encounter: Admit: 2020-10-20 | Discharge: 2020-10-20 | Payer: MEDICARE

## 2020-10-20 MED ORDER — ATORVASTATIN 20 MG PO TAB
ORAL_TABLET | Freq: Every day | 3 refills | Status: AC
Start: 2020-10-20 — End: ?

## 2020-10-23 ENCOUNTER — Encounter: Admit: 2020-10-23 | Discharge: 2020-10-23 | Payer: MEDICARE

## 2020-10-23 DIAGNOSIS — Z853 Personal history of malignant neoplasm of breast: Secondary | ICD-10-CM

## 2020-10-23 DIAGNOSIS — Z9109 Other allergy status, other than to drugs and biological substances: Secondary | ICD-10-CM

## 2020-10-23 DIAGNOSIS — F32A Depression: Secondary | ICD-10-CM

## 2020-10-23 DIAGNOSIS — U071 COVID-19: Secondary | ICD-10-CM

## 2020-10-23 DIAGNOSIS — M549 Dorsalgia, unspecified: Secondary | ICD-10-CM

## 2020-10-23 DIAGNOSIS — E785 Hyperlipidemia, unspecified: Secondary | ICD-10-CM

## 2020-10-23 DIAGNOSIS — C50212 Malignant neoplasm of upper-inner quadrant of left female breast: Secondary | ICD-10-CM

## 2020-10-23 DIAGNOSIS — K219 Gastro-esophageal reflux disease without esophagitis: Secondary | ICD-10-CM

## 2020-10-23 DIAGNOSIS — M810 Age-related osteoporosis without current pathological fracture: Secondary | ICD-10-CM

## 2020-10-23 DIAGNOSIS — M1990 Unspecified osteoarthritis, unspecified site: Secondary | ICD-10-CM

## 2020-10-23 DIAGNOSIS — E559 Vitamin D deficiency, unspecified: Secondary | ICD-10-CM

## 2020-10-23 DIAGNOSIS — Z1379 Encounter for other screening for genetic and chromosomal anomalies: Secondary | ICD-10-CM

## 2020-10-23 DIAGNOSIS — E162 Hypoglycemia, unspecified: Secondary | ICD-10-CM

## 2020-10-23 DIAGNOSIS — G629 Polyneuropathy, unspecified: Secondary | ICD-10-CM

## 2020-10-23 DIAGNOSIS — M3500 Sicca syndrome, unspecified: Secondary | ICD-10-CM

## 2020-10-23 DIAGNOSIS — E782 Mixed hyperlipidemia: Secondary | ICD-10-CM

## 2020-10-27 ENCOUNTER — Encounter: Admit: 2020-10-27 | Discharge: 2020-10-27 | Payer: MEDICARE

## 2020-10-27 ENCOUNTER — Ambulatory Visit: Admit: 2020-10-27 | Discharge: 2020-10-27 | Payer: MEDICARE

## 2020-10-27 DIAGNOSIS — E785 Hyperlipidemia, unspecified: Secondary | ICD-10-CM

## 2020-10-27 DIAGNOSIS — Z9109 Other allergy status, other than to drugs and biological substances: Secondary | ICD-10-CM

## 2020-10-27 DIAGNOSIS — M3500 Sicca syndrome, unspecified: Secondary | ICD-10-CM

## 2020-10-27 DIAGNOSIS — E559 Vitamin D deficiency, unspecified: Secondary | ICD-10-CM

## 2020-10-27 DIAGNOSIS — U071 COVID-19: Secondary | ICD-10-CM

## 2020-10-27 DIAGNOSIS — Z1379 Encounter for other screening for genetic and chromosomal anomalies: Secondary | ICD-10-CM

## 2020-10-27 DIAGNOSIS — E782 Mixed hyperlipidemia: Secondary | ICD-10-CM

## 2020-10-27 DIAGNOSIS — M1990 Unspecified osteoarthritis, unspecified site: Secondary | ICD-10-CM

## 2020-10-27 DIAGNOSIS — F32A Depression: Secondary | ICD-10-CM

## 2020-10-27 DIAGNOSIS — C50212 Malignant neoplasm of upper-inner quadrant of left female breast: Secondary | ICD-10-CM

## 2020-10-27 DIAGNOSIS — E162 Hypoglycemia, unspecified: Secondary | ICD-10-CM

## 2020-10-27 DIAGNOSIS — Z853 Personal history of malignant neoplasm of breast: Secondary | ICD-10-CM

## 2020-10-27 DIAGNOSIS — M549 Dorsalgia, unspecified: Secondary | ICD-10-CM

## 2020-10-27 DIAGNOSIS — M810 Age-related osteoporosis without current pathological fracture: Secondary | ICD-10-CM

## 2020-10-27 DIAGNOSIS — K219 Gastro-esophageal reflux disease without esophagitis: Secondary | ICD-10-CM

## 2020-10-27 DIAGNOSIS — G629 Polyneuropathy, unspecified: Secondary | ICD-10-CM

## 2020-10-27 LAB — URINALYSIS DIPSTICK REFLEX TO CULTURE
GLUCOSE,UA: NEGATIVE
LEUKOCYTES: NEGATIVE
NITRITE: NEGATIVE
URINE ASCORBIC ACID, UA: NEGATIVE
URINE BILE: NEGATIVE
URINE BLOOD: NEGATIVE
URINE KETONE: NEGATIVE
URINE PH: 7 (ref 5.0–8.0)
URINE SPEC GRAVITY: 1 (ref 1.005–1.030)

## 2020-10-27 LAB — C4 COMPLEMENT 4: C4: 44 mg/dL (ref 10–49)

## 2020-10-27 LAB — CBC AND DIFF
ABSOLUTE BASO COUNT: 0 K/UL (ref 0–0.20)
ABSOLUTE EOS COUNT: 0 K/UL (ref 0–0.45)
ABSOLUTE MONO COUNT: 0.3 K/UL (ref 0–0.80)
EOSINOPHILS %: 1 % (ref >60)
HEMATOCRIT: 40 % (ref 36–45)
LYMPHOCYTES %: 37 % (ref 24–44)
MCH: 29 pg (ref 26–34)
MONOCYTES %: 9 % (ref 4–12)
MPV: 9.6 FL (ref 7–11)
NEUTROPHILS %: 52 % (ref 41–77)
RBC COUNT: 4.5 M/UL (ref 4.0–5.0)
RDW: 14 % (ref 11–15)
WBC COUNT: 4.5 K/UL (ref 4.5–11.0)

## 2020-10-27 LAB — PROTEIN/CR RATIO,UR RAN
UR CREATININE, RAN: 105 mg/dL
UR TOTAL PROTEIN,RAN: 15 mg/dL

## 2020-10-27 LAB — C REACTIVE PROTEIN (CRP): C-REACTIVE PROTEIN: 0.1 mg/dL (ref <1.0)

## 2020-10-27 LAB — C3 COMPLEMENT 3: C3: 154 mg/dL (ref 88–200)

## 2020-10-27 LAB — COMPREHENSIVE METABOLIC PANEL
POTASSIUM: 4.8 MMOL/L (ref 3.5–5.1)
SODIUM: 143 MMOL/L (ref 137–147)

## 2020-10-27 LAB — URINALYSIS MICROSCOPIC REFLEX TO CULTURE

## 2020-10-27 LAB — IMMUNOGLOBULINS-IGA,IGG,IGM
IGA: 84 mg/dL (ref 70–390)
IGG: 433 mg/dL — ABNORMAL LOW (ref 762–1488)
IGM: 67 mg/dL (ref 38–328)

## 2020-10-27 LAB — SED RATE: ESR: 5 mm/h (ref 0–30)

## 2020-10-27 MED ORDER — HYDROXYCHLOROQUINE 200 MG PO TAB
300 mg | ORAL_TABLET | Freq: Every day | ORAL | 0 refills | 90.00000 days | Status: AC
Start: 2020-10-27 — End: ?

## 2020-10-27 MED ORDER — HYDROXYCHLOROQUINE 200 MG PO TAB
200 mg | ORAL_TABLET | Freq: Two times a day (BID) | ORAL | 0 refills | 90.00000 days | Status: DC
Start: 2020-10-27 — End: 2020-10-27

## 2020-10-27 MED ORDER — DULOXETINE 30 MG PO CPDR
ORAL_CAPSULE | Freq: Every day | 0 refills
Start: 2020-10-27 — End: ?

## 2020-10-27 NOTE — Patient Instructions
It was pleasure to see you again [facility], please do not hesitate to call me for any questions or concerns  For now I think her disease is fairly stable and I would like you to lower the dose of hydroxychloroquine to 1-1/2 tablet every day instead of 2 tablets daily, for a total of 300 mg daily  Continue to obtain annual eye examination     Labs to be repeated today  We will arrange for the reflux infusion as soon as possible given your diagnosis of osteoporosis  We will see her back in about 6

## 2020-10-27 NOTE — Telephone Encounter
Call from patient requesting clarification for hydroxychloroquine prescription, new prescription sent per 10/27/20 OV "Lower hydroxychloroquine dose to 300 mg daily ".

## 2020-10-27 NOTE — Progress Notes
Kristen Norris  is a 70 year old- Caucasian female here for a follow-up visit    The patient is referred by Dr. Olene Floss    Chief complaint: Management of seronegative Sjogren's syndrome      Interval history:  Last seen by me in April at which time we recommended proceeding with Reclast infusion in June, she remains on hydroxychloroquine 400 mg daily for management of her Sjogren's  She was seen last week by my colleague Trinda Pascal and I reviewed her note  She now reports that few weeks ago she received epidural injections by pain management and now starts feeling improvement of her symptoms, she is scheduled to see orthopedic surgery to discuss the need for any surgical intervention for her lumbar spine disease    Her most recent laboratory testing performed in April revealed normal disease activity except for slightly decreased serum IgG of 613    She continues to report ongoing sicca syndrome particularly dry eye disease but reports that using lubricating gels have been helpful, her ophthalmologist commented on improvement of her dry eye evaluation during her last visit  She has been compliant with hydroxychloroquine and denies rashes and swollen glands, she has some questions regarding her low back pain which we discussed today        Current medication list:  Reviewed      Physical Examination:    VITAL SIGNS: BP 137/63 (BP Source: Arm, Right Upper, Patient Position: Sitting)  - Pulse 58  - Temp 36.2 ?C (97.2 ?F) (Temporal)  - Resp 16  - Ht 157.5 cm (5' 2)  - Wt 62.1 kg (137 lb)  - LMP  (LMP Unknown)  - SpO2 97%  - BMI 25.06 kg/m?      GENERAL APPEARANCE: The patient is a well-nourished adult in no acute distress    EYES: No scleral erythema or conjunctival injection.    There is no parotitis or masses felt on exam today    No synovitis on examination of the hands    Schirmer's test  OS 3 mm  OD 4 mm     Unstimulated salivary flow rate= 0.16 ml/min                                   Assessment and plan:    1. Sjogren's disease, she fulfills 2016 classification criteria based on the presence of sicca syndrome, p myalgia and arthralgia, objective evidence of dry eyes and dry mouth, positive ANA and rheumatoid factor and a positive lip biopsy with focus score of 2    She started hydroxychloroquine in August 2020 with some relief of her symptoms, given her current weight we plan to lower the dose to 300 mg daily, she will continue to obtain annual eye exam  I plan to repeat her activity labs today and reassess her back in about 6 months    2. Osteoporosis: she was on prolia but could not afford it after 2 doses, Fosamax was started in January 2021 but stopped in May due to epigastric pain, she received the first dose of Reclast in June 2021, she is now overdue for therapy and so unclear to me why this has not been arranged yet, will communicate with our infusion center team    3. 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 Breast  cancer: Diagnosed in 2007, in remission since       Plan:  Lower hydroxychloroquine dose to 300 mg daily   Repeat Sjogren's labs today   Proceed with Reclast ASAP  Continue lubricating gel     I will see her back in about 6 months    Leverne Humbles, MD    Because this dictation was prepared with voice recognition software, there remains a potential for typographical errors or incorrect word choices by the system. We apologize for any inadvertent inconvenience from such an error.

## 2020-10-30 ENCOUNTER — Encounter: Admit: 2020-10-30 | Discharge: 2020-10-30 | Payer: MEDICARE

## 2020-10-30 MED ORDER — FLECAINIDE 50 MG PO TAB
ORAL_TABLET | Freq: Two times a day (BID) | 0 refills
Start: 2020-10-30 — End: ?

## 2020-11-02 ENCOUNTER — Encounter: Admit: 2020-11-02 | Discharge: 2020-11-02 | Payer: MEDICARE

## 2020-11-03 ENCOUNTER — Ambulatory Visit: Admit: 2020-11-03 | Discharge: 2020-11-03 | Payer: MEDICARE

## 2020-11-03 ENCOUNTER — Encounter: Admit: 2020-11-03 | Discharge: 2020-11-03 | Payer: MEDICARE

## 2020-11-03 DIAGNOSIS — M818 Other osteoporosis without current pathological fracture: Secondary | ICD-10-CM

## 2020-11-03 DIAGNOSIS — M3500 Sicca syndrome, unspecified: Secondary | ICD-10-CM

## 2020-11-03 DIAGNOSIS — C50212 Malignant neoplasm of upper-inner quadrant of left female breast: Secondary | ICD-10-CM

## 2020-11-03 DIAGNOSIS — E162 Hypoglycemia, unspecified: Secondary | ICD-10-CM

## 2020-11-03 DIAGNOSIS — M810 Age-related osteoporosis without current pathological fracture: Secondary | ICD-10-CM

## 2020-11-03 DIAGNOSIS — E559 Vitamin D deficiency, unspecified: Secondary | ICD-10-CM

## 2020-11-03 DIAGNOSIS — U071 COVID-19: Secondary | ICD-10-CM

## 2020-11-03 DIAGNOSIS — M549 Dorsalgia, unspecified: Secondary | ICD-10-CM

## 2020-11-03 DIAGNOSIS — Z9109 Other allergy status, other than to drugs and biological substances: Secondary | ICD-10-CM

## 2020-11-03 DIAGNOSIS — K219 Gastro-esophageal reflux disease without esophagitis: Secondary | ICD-10-CM

## 2020-11-03 DIAGNOSIS — G629 Polyneuropathy, unspecified: Secondary | ICD-10-CM

## 2020-11-03 DIAGNOSIS — E782 Mixed hyperlipidemia: Secondary | ICD-10-CM

## 2020-11-03 DIAGNOSIS — M1990 Unspecified osteoarthritis, unspecified site: Secondary | ICD-10-CM

## 2020-11-03 DIAGNOSIS — Z853 Personal history of malignant neoplasm of breast: Secondary | ICD-10-CM

## 2020-11-03 DIAGNOSIS — Z1379 Encounter for other screening for genetic and chromosomal anomalies: Secondary | ICD-10-CM

## 2020-11-03 DIAGNOSIS — Z9889 Other specified postprocedural states: Secondary | ICD-10-CM

## 2020-11-03 DIAGNOSIS — E785 Hyperlipidemia, unspecified: Secondary | ICD-10-CM

## 2020-11-03 DIAGNOSIS — F32A Depression: Secondary | ICD-10-CM

## 2020-11-03 MED ORDER — ZOLEDRONIC ACID-MANNITOL-WATER 5 MG/100 ML IV PGBK
5 mg | Freq: Once | INTRAVENOUS | 0 refills | Status: CN
Start: 2020-11-03 — End: ?

## 2020-11-03 MED ORDER — ZOLEDRONIC ACID-MANNITOL-WATER 5 MG/100 ML IV PGBK
5 mg | Freq: Once | INTRAVENOUS | 0 refills | Status: CP
Start: 2020-11-03 — End: ?
  Administered 2020-11-03: 15:00:00 5 mg via INTRAVENOUS

## 2020-11-03 NOTE — Progress Notes
Subjective:      Kristen Norris returns to clinic today for post op visit after undergoing a excision of upper back lipoma.  Patient developed sterile post operative seroma and she returned today for evaluation.  Patient reports that she is doing well overall.  Seroma has resolved without issues.     Objective:     BP 124/58 (BP Source: Arm, Right Upper, Patient Position: Sitting)  - Pulse 63  - Temp 36.3 C (97.3 F) (Temporal)  - Resp 16  - Wt 64 kg (141 lb)  - LMP  (LMP Unknown)  - SpO2 95%  - BMI 25.79 kg/m     General:  alert and no distress   Incision:   healing well, no drainage, no erythema, no hernia, no swelling           Assessment:     S/P excision of upper back lipoma    Plan:     1. Instructed on incision care  2. Discussed increasing activities as tolerated  3. RTC PRN    Nickola Major, APRN-BC  Pager [phone]    [facility]  Department of [place]  [address]  [place], [place]  [address]

## 2020-11-10 ENCOUNTER — Encounter: Admit: 2020-11-10 | Discharge: 2020-11-10 | Payer: MEDICARE

## 2020-11-10 NOTE — Telephone Encounter
Called and offered 4:40pm this Thursday 11/12/20 via telehealth, patient agreeable.

## 2020-11-10 NOTE — Telephone Encounter
Call from patient with concerns for active disease.    Called and spoke with patient, she reports severe dryness of the mouth and eyes for the past 1-2 weeks. She reports she had a dentist appointment Wednesday and her dentist had mentioned pilocarpine and had recommended she follow up with rheumatology. She also reports dry eyes in addition to the dry mouth.    She has not been on pilocarpine before, she uses a lubricant gel currently with some relief but it does not seem to be improving her symptoms at this time.    She also was concerned with her IgG level and wanted to know if there was a correlation with her symptoms. Advised patient her concerns would be routed to provider.

## 2020-11-12 ENCOUNTER — Encounter: Admit: 2020-11-12 | Discharge: 2020-11-12 | Payer: MEDICARE

## 2020-11-12 DIAGNOSIS — E782 Mixed hyperlipidemia: Secondary | ICD-10-CM

## 2020-11-12 DIAGNOSIS — E559 Vitamin D deficiency, unspecified: Secondary | ICD-10-CM

## 2020-11-12 DIAGNOSIS — E785 Hyperlipidemia, unspecified: Secondary | ICD-10-CM

## 2020-11-12 DIAGNOSIS — K219 Gastro-esophageal reflux disease without esophagitis: Secondary | ICD-10-CM

## 2020-11-12 DIAGNOSIS — G629 Polyneuropathy, unspecified: Secondary | ICD-10-CM

## 2020-11-12 DIAGNOSIS — M810 Age-related osteoporosis without current pathological fracture: Secondary | ICD-10-CM

## 2020-11-12 DIAGNOSIS — E162 Hypoglycemia, unspecified: Secondary | ICD-10-CM

## 2020-11-12 DIAGNOSIS — C50212 Malignant neoplasm of upper-inner quadrant of left female breast: Secondary | ICD-10-CM

## 2020-11-12 DIAGNOSIS — U071 COVID-19: Secondary | ICD-10-CM

## 2020-11-12 DIAGNOSIS — M3500 Sicca syndrome, unspecified: Secondary | ICD-10-CM

## 2020-11-12 DIAGNOSIS — Z1379 Encounter for other screening for genetic and chromosomal anomalies: Secondary | ICD-10-CM

## 2020-11-12 DIAGNOSIS — F32A Depression: Secondary | ICD-10-CM

## 2020-11-12 DIAGNOSIS — M1990 Unspecified osteoarthritis, unspecified site: Secondary | ICD-10-CM

## 2020-11-12 DIAGNOSIS — Z9109 Other allergy status, other than to drugs and biological substances: Secondary | ICD-10-CM

## 2020-11-12 DIAGNOSIS — M549 Dorsalgia, unspecified: Secondary | ICD-10-CM

## 2020-11-12 DIAGNOSIS — Z853 Personal history of malignant neoplasm of breast: Secondary | ICD-10-CM

## 2020-11-13 ENCOUNTER — Encounter: Admit: 2020-11-13 | Discharge: 2020-11-13 | Payer: MEDICARE

## 2020-11-13 DIAGNOSIS — M1990 Unspecified osteoarthritis, unspecified site: Secondary | ICD-10-CM

## 2020-11-13 DIAGNOSIS — Z853 Personal history of malignant neoplasm of breast: Secondary | ICD-10-CM

## 2020-11-13 DIAGNOSIS — K219 Gastro-esophageal reflux disease without esophagitis: Secondary | ICD-10-CM

## 2020-11-13 DIAGNOSIS — M549 Dorsalgia, unspecified: Secondary | ICD-10-CM

## 2020-11-13 DIAGNOSIS — Z9109 Other allergy status, other than to drugs and biological substances: Secondary | ICD-10-CM

## 2020-11-13 DIAGNOSIS — G629 Polyneuropathy, unspecified: Secondary | ICD-10-CM

## 2020-11-13 DIAGNOSIS — E162 Hypoglycemia, unspecified: Secondary | ICD-10-CM

## 2020-11-13 DIAGNOSIS — Z1379 Encounter for other screening for genetic and chromosomal anomalies: Secondary | ICD-10-CM

## 2020-11-13 DIAGNOSIS — U071 COVID-19: Secondary | ICD-10-CM

## 2020-11-13 DIAGNOSIS — M3500 Sicca syndrome, unspecified: Secondary | ICD-10-CM

## 2020-11-13 DIAGNOSIS — E785 Hyperlipidemia, unspecified: Secondary | ICD-10-CM

## 2020-11-13 DIAGNOSIS — E559 Vitamin D deficiency, unspecified: Secondary | ICD-10-CM

## 2020-11-13 DIAGNOSIS — C50212 Malignant neoplasm of upper-inner quadrant of left female breast: Secondary | ICD-10-CM

## 2020-11-13 DIAGNOSIS — F32A Depression: Secondary | ICD-10-CM

## 2020-11-13 DIAGNOSIS — E782 Mixed hyperlipidemia: Secondary | ICD-10-CM

## 2020-11-13 DIAGNOSIS — M810 Age-related osteoporosis without current pathological fracture: Secondary | ICD-10-CM

## 2020-11-13 MED ORDER — SUGAMMADEX 100 MG/ML IV SOLN
INTRAVENOUS | 0 refills | Status: DC
Start: 2020-11-13 — End: 2020-11-13
  Administered 2020-11-13: 15:00:00 150 mg via INTRAVENOUS

## 2020-11-13 MED ORDER — FENTANYL CITRATE (PF) 50 MCG/ML IJ SOLN
INTRAVENOUS | 0 refills | Status: DC
Start: 2020-11-13 — End: 2020-11-13
  Administered 2020-11-13: 14:00:00 50 ug via INTRAVENOUS
  Administered 2020-11-13: 13:00:00 100 ug via INTRAVENOUS

## 2020-11-13 MED ORDER — ARTIFICIAL TEARS (PF) SINGLE DOSE DROPS GROUP
OPHTHALMIC | 0 refills | Status: DC
Start: 2020-11-13 — End: 2020-11-13
  Administered 2020-11-13: 13:00:00 2 [drp] via OPHTHALMIC

## 2020-11-13 MED ORDER — ONDANSETRON HCL (PF) 4 MG/2 ML IJ SOLN
INTRAVENOUS | 0 refills | Status: DC
Start: 2020-11-13 — End: 2020-11-13
  Administered 2020-11-13: 14:00:00 4 mg via INTRAVENOUS

## 2020-11-13 MED ORDER — PROPOFOL INJ 10 MG/ML IV VIAL
INTRAVENOUS | 0 refills | Status: DC
Start: 2020-11-13 — End: 2020-11-13
  Administered 2020-11-13: 13:00:00 140 mg via INTRAVENOUS

## 2020-11-13 MED ORDER — LIDOCAINE (PF) 200 MG/10 ML (2 %) IJ SYRG
INTRAVENOUS | 0 refills | Status: DC
Start: 2020-11-13 — End: 2020-11-13
  Administered 2020-11-13: 13:00:00 100 mg via INTRAVENOUS

## 2020-11-13 MED ORDER — PHENYLEPHRINE HCL IN 0.9% NACL 1 MG/10 ML (100 MCG/ML) IV SYRG
INTRAVENOUS | 0 refills | Status: DC
Start: 2020-11-13 — End: 2020-11-13
  Administered 2020-11-13: 13:00:00 250 ug via INTRAVENOUS
  Administered 2020-11-13: 14:00:00 50 ug via INTRAVENOUS
  Administered 2020-11-13 (×2): 100 ug via INTRAVENOUS
  Administered 2020-11-13: 13:00:00 150 ug via INTRAVENOUS

## 2020-11-13 MED ORDER — EPHEDRINE SULFATE 50 MG/ML IV SOLN
INTRAVENOUS | 0 refills | Status: DC
Start: 2020-11-13 — End: 2020-11-13
  Administered 2020-11-13 (×3): 10 mg via INTRAVENOUS

## 2020-11-13 MED ORDER — PHENYLEPHRINE 40 MCG/ML IN NS IV DRIP (STD CONC)
INTRAVENOUS | 0 refills | Status: DC
Start: 2020-11-13 — End: 2020-11-13
  Administered 2020-11-13 (×2): .5 ug/kg/min via INTRAVENOUS

## 2020-11-13 MED ORDER — DEXAMETHASONE SODIUM PHOSPHATE 4 MG/ML IJ SOLN
INTRAVENOUS | 0 refills | Status: DC
Start: 2020-11-13 — End: 2020-11-13
  Administered 2020-11-13: 13:00:00 4 mg via INTRAVENOUS

## 2020-11-13 MED ORDER — ROCURONIUM 10 MG/ML IV SOLN
INTRAVENOUS | 0 refills | Status: DC
Start: 2020-11-13 — End: 2020-11-13
  Administered 2020-11-13: 13:00:00 50 mg via INTRAVENOUS

## 2020-11-13 MED ORDER — MIDAZOLAM 1 MG/ML IJ SOLN
INTRAVENOUS | 0 refills | Status: DC
Start: 2020-11-13 — End: 2020-11-13
  Administered 2020-11-13: 13:00:00 2 mg via INTRAVENOUS

## 2020-11-13 MED ADMIN — TRAMADOL 50 MG PO TAB [14632]: 50 mg | ORAL | @ 23:00:00 | NDC 68084080811

## 2020-11-13 MED ADMIN — OXYCODONE 5 MG PO TAB [10814]: 5 mg | ORAL | @ 22:00:00 | NDC 00904696661

## 2020-11-13 MED ADMIN — HYOSCYAMINE SULFATE 0.125 MG PO TBDI [29822]: 0.125 mg | SUBLINGUAL | @ 16:00:00 | NDC 47781001201

## 2020-11-13 MED ADMIN — FENTANYL CITRATE (PF) 50 MCG/ML IJ SOLN [3037]: 50 ug | INTRAVENOUS | @ 21:00:00 | NDC 00409909412

## 2020-11-13 MED ADMIN — NEOMYCIN-POLYMYXIN B GU 40 MG-200,000 UNIT/ML IR SOLN [82715]: 1000 mL | @ 14:00:00 | Stop: 2020-11-13 | NDC 39822120101

## 2020-11-13 MED ADMIN — FENTANYL CITRATE (PF) 50 MCG/ML IJ SOLN [3037]: 25 ug | INTRAVENOUS | @ 16:00:00 | NDC 00409909412

## 2020-11-13 MED ADMIN — TRAMADOL 50 MG PO TAB [14632]: 50 mg | ORAL | @ 16:00:00 | NDC 68084080811

## 2020-11-13 MED ADMIN — OXYBUTYNIN CHLORIDE 10 MG PO TR24 [77806]: 10 mg | ORAL | @ 16:00:00 | NDC 27241015604

## 2020-11-13 MED ADMIN — LACTATED RINGERS IV SOLP [4318]: 1000 mL | INTRAVENOUS | @ 12:00:00 | Stop: 2020-11-13 | NDC 00338011704

## 2020-11-13 MED ADMIN — FENTANYL CITRATE (PF) 50 MCG/ML IJ SOLN [3037]: 50 ug | INTRAVENOUS | @ 20:00:00 | NDC 00409909412

## 2020-11-13 MED ADMIN — LACTATED RINGERS IV SOLP [4318]: 1000.000 mL | INTRAVENOUS | @ 13:00:00 | Stop: 2020-11-13 | NDC 00338011704

## 2020-11-13 MED ADMIN — SODIUM CHLORIDE 0.9 % IR SOLN [11403]: 1000 mL | @ 14:00:00 | Stop: 2020-11-13 | NDC 00338004804

## 2020-11-13 MED ADMIN — DULOXETINE 60 MG PO CPDR [93425]: 60 mg | ORAL | @ 23:00:00 | NDC 57237001930

## 2020-11-13 MED ADMIN — METRONIDAZOLE 0.75 % (37.5MG/5 GRAM) VA GEL [78211]: 1 | TOPICAL | @ 15:00:00 | Stop: 2020-11-13 | NDC 00781707787

## 2020-11-13 MED ADMIN — CEFAZOLIN INJ 1GM IVP [210319]: 2 g | INTRAVENOUS | @ 13:00:00 | Stop: 2020-11-13 | NDC 00143992490

## 2020-11-13 MED ADMIN — OXYCODONE 5 MG PO TAB [10814]: 5 mg | ORAL | @ 18:00:00 | NDC 00904696661

## 2020-11-13 MED ADMIN — SODIUM CHLORIDE 0.9 % IV SOLP [27838]: 1000.000 mL | INTRAVENOUS | @ 16:00:00 | Stop: 2020-11-15 | NDC 00338004904

## 2020-11-13 MED ADMIN — ACETAMINOPHEN 500 MG PO TAB [102]: 1000 mg | ORAL | @ 16:00:00 | Stop: 2020-11-13 | NDC 00904673080

## 2020-11-13 MED ADMIN — OXYBUTYNIN CHLORIDE 10 MG PO TR24 [77806]: 10 mg | ORAL | @ 23:00:00 | NDC 27241015604

## 2020-11-13 MED ADMIN — POLYMYXIN B SULFATE 500,000 UNIT IJ SOLR [6393]: 1000 mL | @ 14:00:00 | Stop: 2020-11-13 | NDC 63323036701

## 2020-11-13 MED ADMIN — HYOSCYAMINE SULFATE 0.125 MG PO TBDI [29822]: 0.125 mg | SUBLINGUAL | @ 23:00:00 | NDC 47781001201

## 2020-11-13 MED ADMIN — ATORVASTATIN 20 MG PO TAB [77412]: 20 mg | ORAL | @ 23:00:00 | NDC 00904629161

## 2020-11-13 NOTE — Anesthesia Post-Procedure Evaluation
Post-Anesthesia Evaluation    Name: Kristen Norris      MRN: [id number]     DOB: Feb 23, 1950     Age: 70 y.o.     Sex: female   __________________________________________________________________________     Procedure Information     Anesthesia Start Date/Time: 11/13/20 0756    Procedures:       anterior colporrhaphy (cystocele repair);  Posterior colporrhaphy (rectocele repair); (N/A Vagina ) - 2 HRS      CYSTOURETHROSCOPY (N/A Bladder)    Location: MAIN OR 05 / Main OR/Periop    Surgeons: Darden Amber, MD          Post-Anesthesia Vitals  BP: 105/70 (10/07 1110)  Temp: 36.7 C (98.1 F) (10/07 1110)  Pulse: 79 (10/07 1110)  Respirations: 15 PER MINUTE (10/07 1110)  SpO2: 96 % (10/07 1110)  SpO2 Pulse: 80 (10/07 1110)  O2 Device: Nasal cannula (10/07 1110)   Vitals Value Taken Time   BP 105/70 11/13/20 1110   Temp 36.7 C (98.1 F) 11/13/20 1110   Pulse 79 11/13/20 1110   Respirations 15 PER MINUTE 11/13/20 1110   SpO2 96 % 11/13/20 1110   O2 Device Nasal cannula 11/13/20 1110   ABP     ART BP           Post Anesthesia Evaluation Note    Evaluation location: pre/post  Patient participation: recovered; patient participated in evaluation  Level of consciousness: alert  Pain management: adequate    Hydration: normovolemia  Temperature: 36.0C - 38.4C    Perioperative Events       Post-op nausea and vomiting: no PONV    Postoperative Status  Cardiovascular status: hemodynamically stable  Respiratory status: spontaneous ventilation        Perioperative Events  There were no known complications for this encounter.

## 2020-11-13 NOTE — Anesthesia Pre-Procedure Evaluation
Anesthesia Pre-Procedure Evaluation    Name: Kristen Norris      MRN: [id number]     DOB: Sep 30, 1950     Age: 70 y.o.     Sex: female   _________________________________________________________________________     Procedure Info:   Procedure Information     Date/Time: 11/13/20 0800    Procedure: anterior colporrhaphy (cystocele repair);  Posterior colporrhaphy (rectocele repair);  cystourethroscopy (N/A ) - 2 HRS    Location: MAIN OR 05 / Main OR/Periop    Surgeons: [name], MD          Physical Assessment  Vital Signs (last filed in past 24 hours):  BP: 102/65 (10/07 0716)  Temp: 36.7 ?C (98.1 ?F) (10/07 9811)  Pulse: 68 (10/07 0716)  Respirations: 16 PER MINUTE (10/07 0716)  SpO2: 96 % (10/07 0716)  O2 Device: None (Room air) (10/07 0716)  Height: 157.5 cm (5' 2.01) (10/07 9147)  Weight: 60.3 kg (133 lb) (10/07 0716)      Patient History   Allergies   Allergen Reactions   ? Sulfa (Sulfonamide Antibiotics) RASH and NAUSEA AND VOMITING     Allergy recorded in SMS: Sulfa        Current Medications    Medication Directions   atorvastatin (LIPITOR) 20 mg tablet Take 1 tablet by mouth once daily   benzonatate (TESSALON PERLES) 100 mg capsule Take 100 mg by mouth every 8 hours as needed for Cough.   CALCIUM CARBONATE (CALCIUM 500 PO) Take 1 Cap by mouth Daily.   Cholecalciferol (VITAMIN D3) 125 mcg (5,000 unit) capsule Take 10,000 Units by mouth daily.   duloxetine DR (CYMBALTA) 30 mg capsule TAKE 1 CAPSULE BY MOUTH ONCE DAILY FOR 14 DAYS THEN  INCREASE  TO  60MG   DAILY   flecainide (TAMBOCOR) 50 mg tablet Take 1 tablet by mouth twice daily   hydrOXYchloroQUINE (PLAQUENIL) 200 mg tablet Take 1.5 tablets by mouth daily. Take with food.   metoprolol XL (TOPROL XL) 50 mg extended release tablet Take 1 tablet by mouth once daily   oxybutynin XL (DITROPAN XL) 10 mg tablet TAKE 1 TABLET BY MOUTH ONCE DAILY DO  NOT  CUT  CRUSH  OR  CHEW   senna/docusate (SENOKOT-S) 8.6/50 mg tablet Take one tablet by mouth twice daily. Indications: constipation   traMADoL (ULTRAM) 50 mg tablet Take one tablet by mouth every 4 hours as needed for Pain. Indications: neuropathic pain, pain         Review of Systems/Medical History      Patient summary reviewed  Nursing notes reviewed  Pertinent labs reviewed    PONV Screening: Female gender and Non-smoker  No history of anesthetic complications  No family history of anesthetic complications      Airway - negative        Pulmonary - negative          Cardiovascular         Exercise tolerance: >4 METS       Beta Blocker therapy: Yes      Beta blockers within 24 hours: Yes      Dysrhythmias (h/o PSVT and known LBBB)      Hyperlipidemia      Pt denies any h/o MI, CHF or significant arrhythmias.  Pt denies any CP or SOB with > 4 metabolic equivalents.      6/22?Cards  Breast?ca?with?chemo:  In?March?she?had?an?odd?episode?of?very?brief?syncope?while?laying?down?watching?TV?with?her?great?grandson.??She?saw?Tom?Rosamond?who?ordered?some?images?studies,?all?of?which?came?back?looking?OK.??Her?EF?is?low?end?of?normal,?45%?by?echo?and?53%?by?MUGA.  ?  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  ?  She?isn't?having?chest?discomfort.??She?still?has?bilateral?leg?swelling,?but?is?using?compression?stockings?fairly?regularly?now.  ?  Assessment?and?Plan  ?  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  ?  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    Nuc stress test 07/2020:  SUMMARY/OPINION: This gated blood pool study shows a calculated left ventricular ejection fraction of 53% with abnormal septal motion and mild and inferoseptal hypokinesis consistent with the patient's known history of left bundle branch block.  Right ventricular chamber size and function appears to be qualitatively normal.      GI/Hepatic/Renal           GERD, well controlled      Neuro/Psych         Neuropathy        Psychiatric history          Depression      Musculoskeletal         Back pain      Arthritis      Endocrine/Other         Autoimmune disease ( Sjogren's disease )      Malignancy (h/o breast cancer)    Constitution - negative   Physical Exam    Airway Findings      Mallampati: II      TM distance: >3 FB      Neck ROM: full      Mouth opening: good      Airway patency: adequate    Dental Findings: Negative            Cardiovascular Findings: Negative      Rhythm: regular      Rate: normal    Pulmonary Findings: Negative      Breath sounds clear to auscultation.    Neurological Findings: Negative      Alert and oriented x 3    Constitutional findings: Negative      No acute distress       Diagnostic Tests  Hematology:   Lab Results   Component Value Date    HGB 13.6 10/27/2020    HCT 40.6 10/27/2020    PLTCT 224 10/27/2020    WBC 4.5 10/27/2020    NEUT 52 10/27/2020    ANC 2.36 10/27/2020    LYMPH 35 09/06/2005    ALC 1.68 10/27/2020    MONA 9 10/27/2020    AMC 0.39 10/27/2020    EOSA 1 10/27/2020    ABC 0.03 10/27/2020    BASOPHILS 2 09/06/2005    MCV 88.6 10/27/2020    MCH 29.8 10/27/2020    MCHC 33.6 10/27/2020    MPV 9.6 10/27/2020    RDW 14.3 10/27/2020         General Chemistry:   Lab Results   Component Value Date    NA 143 10/27/2020    K 4.8 10/27/2020    CL 105 10/27/2020    CO2 26 10/27/2020    GAP 12 10/27/2020    BUN 12 10/27/2020    CR 0.85 10/27/2020    GLU 77 10/27/2020    GLU 98 09/23/2005    CA 9.6 10/27/2020    ALBUMIN 4.1 10/27/2020    MG 2.1 04/28/2020    TOTBILI 0.7 10/27/2020      Coagulation:   Lab Results   Component Value Date    PT 12.2 01/23/2006    PTT 24.7 01/23/2006    INR 1.1 01/23/2006         Anesthesia Plan    ASA score: 3   Plan: general  Induction method: intravenous  NPO status: acceptable      Informed Consent  Anesthetic plan and risks  discussed with patient.  Use of blood products discussed with patient      Plan discussed with: CRNA and surgeon/proceduralist.

## 2020-11-14 ENCOUNTER — Encounter: Admit: 2020-11-14 | Discharge: 2020-11-14 | Payer: MEDICARE

## 2020-11-14 MED ADMIN — MELATONIN 5 MG PO TAB [168576]: 5 mg | ORAL | @ 03:00:00 | NDC 77333052025

## 2020-11-14 MED ADMIN — ENOXAPARIN 40 MG/0.4 ML SC SYRG [85052]: 40 mg | SUBCUTANEOUS | @ 03:00:00 | NDC 00781324602

## 2020-11-14 MED ADMIN — OXYCODONE 5 MG PO TAB [10814]: 5 mg | ORAL | @ 14:00:00 | Stop: 2020-11-14 | NDC 00904696661

## 2020-11-14 MED ADMIN — POLYETHYLENE GLYCOL 3350 17 GRAM PO PWPK [25424]: 17 g | ORAL | @ 03:00:00 | NDC 00904693186

## 2020-11-14 MED ADMIN — SODIUM CHLORIDE 0.9 % IV SOLP [27838]: 1000.000 mL | INTRAVENOUS | @ 05:00:00 | Stop: 2020-11-14 | NDC 00338004904

## 2020-11-14 MED ADMIN — POLYETHYLENE GLYCOL 3350 17 GRAM PO PWPK [25424]: 17 g | ORAL | @ 14:00:00 | Stop: 2020-11-14 | NDC 00904693186

## 2020-11-14 MED ADMIN — OXYCODONE 5 MG PO TAB [10814]: 5 mg | ORAL | @ 03:00:00 | NDC 00904696661

## 2020-11-14 MED ADMIN — HYDROXYCHLOROQUINE 200 MG PO TAB [10235]: 300 mg | ORAL | @ 14:00:00 | Stop: 2020-11-14 | NDC 00904704661

## 2020-11-14 MED ADMIN — OXYBUTYNIN CHLORIDE 10 MG PO TR24 [77806]: 10 mg | ORAL | @ 14:00:00 | Stop: 2020-11-14 | NDC 27241015604

## 2020-11-14 MED ADMIN — OXYCODONE 5 MG PO TAB [10814]: 5 mg | ORAL | @ 10:00:00 | Stop: 2020-11-14 | NDC 00904696661

## 2020-11-14 MED ADMIN — SENNOSIDES-DOCUSATE SODIUM 8.6-50 MG PO TAB [40926]: 1 | ORAL | @ 03:00:00 | NDC 70000052601

## 2020-11-14 MED ADMIN — DULOXETINE 60 MG PO CPDR [93425]: 60 mg | ORAL | @ 14:00:00 | Stop: 2020-11-14 | NDC 57237001930

## 2020-11-14 MED ADMIN — ATORVASTATIN 20 MG PO TAB [77412]: 20 mg | ORAL | @ 14:00:00 | Stop: 2020-11-14 | NDC 00904629161

## 2020-11-14 MED ADMIN — FLECAINIDE 50 MG PO TAB [10043]: 50 mg | ORAL | @ 03:00:00 | NDC 00054001020

## 2020-11-14 MED ADMIN — FLECAINIDE 50 MG PO TAB [10043]: 50 mg | ORAL | @ 14:00:00 | Stop: 2020-11-14 | NDC 00054001020

## 2020-11-14 MED ADMIN — SENNOSIDES-DOCUSATE SODIUM 8.6-50 MG PO TAB [40926]: 1 | ORAL | @ 14:00:00 | Stop: 2020-11-14 | NDC 70000052601

## 2020-11-14 MED FILL — HYOSCYAMINE SULFATE 0.125 MG SL SUBL: 0.125 mg | SUBLINGUAL | 5 days supply | Qty: 30 | Fill #1 | Status: CP

## 2020-11-14 MED FILL — OXYCODONE 5 MG PO TAB: 5 mg | ORAL | 3 days supply | Qty: 10 | Fill #1 | Status: CP

## 2020-11-15 ENCOUNTER — Encounter: Admit: 2020-11-15 | Discharge: 2020-11-15 | Payer: MEDICARE

## 2020-11-15 DIAGNOSIS — G629 Polyneuropathy, unspecified: Secondary | ICD-10-CM

## 2020-11-15 DIAGNOSIS — M1990 Unspecified osteoarthritis, unspecified site: Secondary | ICD-10-CM

## 2020-11-15 DIAGNOSIS — Z1379 Encounter for other screening for genetic and chromosomal anomalies: Secondary | ICD-10-CM

## 2020-11-15 DIAGNOSIS — K219 Gastro-esophageal reflux disease without esophagitis: Secondary | ICD-10-CM

## 2020-11-15 DIAGNOSIS — F32A Depression: Secondary | ICD-10-CM

## 2020-11-15 DIAGNOSIS — Z9109 Other allergy status, other than to drugs and biological substances: Secondary | ICD-10-CM

## 2020-11-15 DIAGNOSIS — E162 Hypoglycemia, unspecified: Secondary | ICD-10-CM

## 2020-11-15 DIAGNOSIS — M3500 Sicca syndrome, unspecified: Secondary | ICD-10-CM

## 2020-11-15 DIAGNOSIS — C50212 Malignant neoplasm of upper-inner quadrant of left female breast: Secondary | ICD-10-CM

## 2020-11-15 DIAGNOSIS — M549 Dorsalgia, unspecified: Secondary | ICD-10-CM

## 2020-11-15 DIAGNOSIS — E782 Mixed hyperlipidemia: Secondary | ICD-10-CM

## 2020-11-15 DIAGNOSIS — E559 Vitamin D deficiency, unspecified: Secondary | ICD-10-CM

## 2020-11-15 DIAGNOSIS — Z853 Personal history of malignant neoplasm of breast: Secondary | ICD-10-CM

## 2020-11-15 DIAGNOSIS — E785 Hyperlipidemia, unspecified: Secondary | ICD-10-CM

## 2020-11-15 DIAGNOSIS — U071 COVID-19: Secondary | ICD-10-CM

## 2020-11-15 DIAGNOSIS — M810 Age-related osteoporosis without current pathological fracture: Secondary | ICD-10-CM

## 2020-11-18 ENCOUNTER — Encounter: Admit: 2020-11-18 | Discharge: 2020-11-18 | Payer: MEDICARE

## 2020-11-18 MED ORDER — PILOCARPINE HCL 5 MG PO TAB
5 mg | ORAL_TABLET | Freq: Three times a day (TID) | ORAL | 2 refills | Status: AC
Start: 2020-11-18 — End: ?

## 2020-11-20 ENCOUNTER — Encounter: Admit: 2020-11-20 | Discharge: 2020-11-20 | Payer: MEDICARE

## 2020-11-20 DIAGNOSIS — M5137 Other intervertebral disc degeneration, lumbosacral region: Secondary | ICD-10-CM

## 2020-11-20 NOTE — Telephone Encounter
Kristen Norris contacted the urology team to discuss bleeding per vagina that she is experiencing since her combined anterior and posterior colporrhaphies. She reports intermittent spotting associated with weight-bearing and activity. She described noticing blood while rushing to the restroom but does not believe it is part of her stool. She denies fevers, chills, nausea, vomiting, constipation, flank pain, urinary symptoms. She is scheduled for a 2 week follow up visit with Dr. Illene Silver 11/25/20. We discussed ED precautions including systemic signs of infection and worsening bleeding, intolerable pain.

## 2020-11-25 ENCOUNTER — Encounter: Admit: 2020-11-25 | Discharge: 2020-11-25 | Payer: MEDICARE

## 2020-11-25 ENCOUNTER — Ambulatory Visit: Admit: 2020-11-25 | Discharge: 2020-11-25 | Payer: MEDICARE

## 2020-11-25 DIAGNOSIS — M3500 Sicca syndrome, unspecified: Secondary | ICD-10-CM

## 2020-11-25 DIAGNOSIS — K219 Gastro-esophageal reflux disease without esophagitis: Secondary | ICD-10-CM

## 2020-11-25 DIAGNOSIS — E162 Hypoglycemia, unspecified: Secondary | ICD-10-CM

## 2020-11-25 DIAGNOSIS — F32A Depression: Secondary | ICD-10-CM

## 2020-11-25 DIAGNOSIS — Z853 Personal history of malignant neoplasm of breast: Secondary | ICD-10-CM

## 2020-11-25 DIAGNOSIS — Z1379 Encounter for other screening for genetic and chromosomal anomalies: Secondary | ICD-10-CM

## 2020-11-25 DIAGNOSIS — M810 Age-related osteoporosis without current pathological fracture: Secondary | ICD-10-CM

## 2020-11-25 DIAGNOSIS — U071 COVID-19: Secondary | ICD-10-CM

## 2020-11-25 DIAGNOSIS — G629 Polyneuropathy, unspecified: Secondary | ICD-10-CM

## 2020-11-25 DIAGNOSIS — E782 Mixed hyperlipidemia: Secondary | ICD-10-CM

## 2020-11-25 DIAGNOSIS — C50212 Malignant neoplasm of upper-inner quadrant of left female breast: Secondary | ICD-10-CM

## 2020-11-25 DIAGNOSIS — E559 Vitamin D deficiency, unspecified: Secondary | ICD-10-CM

## 2020-11-25 DIAGNOSIS — M1990 Unspecified osteoarthritis, unspecified site: Secondary | ICD-10-CM

## 2020-11-25 DIAGNOSIS — M549 Dorsalgia, unspecified: Secondary | ICD-10-CM

## 2020-11-25 DIAGNOSIS — Z9109 Other allergy status, other than to drugs and biological substances: Secondary | ICD-10-CM

## 2020-11-25 DIAGNOSIS — E785 Hyperlipidemia, unspecified: Secondary | ICD-10-CM

## 2020-11-26 ENCOUNTER — Encounter: Admit: 2020-11-26 | Discharge: 2020-11-26 | Payer: MEDICARE

## 2020-11-26 ENCOUNTER — Ambulatory Visit: Admit: 2020-11-26 | Discharge: 2020-11-26 | Payer: MEDICARE

## 2020-11-26 DIAGNOSIS — M533 Sacrococcygeal disorders, not elsewhere classified: Secondary | ICD-10-CM

## 2020-11-26 DIAGNOSIS — C50212 Malignant neoplasm of upper-inner quadrant of left female breast: Secondary | ICD-10-CM

## 2020-11-26 DIAGNOSIS — Z1379 Encounter for other screening for genetic and chromosomal anomalies: Secondary | ICD-10-CM

## 2020-11-26 DIAGNOSIS — K219 Gastro-esophageal reflux disease without esophagitis: Secondary | ICD-10-CM

## 2020-11-26 DIAGNOSIS — M1990 Unspecified osteoarthritis, unspecified site: Secondary | ICD-10-CM

## 2020-11-26 DIAGNOSIS — E785 Hyperlipidemia, unspecified: Secondary | ICD-10-CM

## 2020-11-26 DIAGNOSIS — M810 Age-related osteoporosis without current pathological fracture: Secondary | ICD-10-CM

## 2020-11-26 DIAGNOSIS — F32A Depression: Secondary | ICD-10-CM

## 2020-11-26 DIAGNOSIS — E162 Hypoglycemia, unspecified: Secondary | ICD-10-CM

## 2020-11-26 DIAGNOSIS — Z9109 Other allergy status, other than to drugs and biological substances: Secondary | ICD-10-CM

## 2020-11-26 DIAGNOSIS — E782 Mixed hyperlipidemia: Secondary | ICD-10-CM

## 2020-11-26 DIAGNOSIS — G629 Polyneuropathy, unspecified: Secondary | ICD-10-CM

## 2020-11-26 DIAGNOSIS — E559 Vitamin D deficiency, unspecified: Secondary | ICD-10-CM

## 2020-11-26 DIAGNOSIS — U071 COVID-19: Secondary | ICD-10-CM

## 2020-11-26 DIAGNOSIS — M549 Dorsalgia, unspecified: Secondary | ICD-10-CM

## 2020-11-26 DIAGNOSIS — M3500 Sicca syndrome, unspecified: Secondary | ICD-10-CM

## 2020-11-26 DIAGNOSIS — Z853 Personal history of malignant neoplasm of breast: Secondary | ICD-10-CM

## 2020-11-26 DIAGNOSIS — M5416 Radiculopathy, lumbar region: Secondary | ICD-10-CM

## 2020-11-26 MED ORDER — IOHEXOL 240 MG IODINE/ML IV SOLN
2.5 mL | Freq: Once | EPIDURAL | 0 refills | Status: CP
Start: 2020-11-26 — End: ?
  Administered 2020-11-26: 16:00:00 1 mL via EPIDURAL

## 2020-11-26 MED ORDER — METHYLPREDNISOLONE ACETATE 80 MG/ML IJ SUSP
80 mg | Freq: Once | INTRAMUSCULAR | 0 refills | Status: CP
Start: 2020-11-26 — End: ?
  Administered 2020-11-26: 16:00:00 80 mg via INTRAMUSCULAR

## 2020-11-26 NOTE — Patient Instructions
Procedure Completed Today: Lumbar Transforaminal Steroid Injection    Important information following your procedure today: You may drive today    Pain relief may not be immediate. It is possible you may even experience an increase in pain during the first 24-48 hours followed by a gradual decrease of your pain.  Though the procedure is generally safe and complications are rare, we do ask that you be aware of any of the following:   Any swelling, persistent redness, new bleeding, or drainage from the site of the injection.  You should not experience a severe headache.  You should not run a fever over 101? F.  New onset of sharp, severe back & or neck pain.  New onset of upper or lower extremity numbness or weakness.  New difficulty controlling bowel or bladder function after the injection.  New shortness of breath.    If any of these occur, please call to report this occurrence to a nurse at [phone]. If you are calling after 4:00 p.m., on weekends or holidays please call [phone] and ask to have the resident physician on call for the physician paged or go to your local emergency room.  You may experience soreness at the injection site. Ice can be applied at 20 minute intervals. Avoid application of direct heat, hot showers or hot tubs today.  Avoid strenuous activity today. You may resume your regular activities and exercise tomorrow.  Patients with diabetes may see an elevation in blood sugars for 7-10 days after the injection. It is important to pay close attention to your diet, check your blood sugars daily and report extreme elevations to the physician that treats your diabetes.  Patients taking a daily blood thinner can resume their regular dose this evening.  It is important that you take all medications ordered by your pain physician. Taking medication as ordered is an important part of your pain care plan. If you cannot continue the medication plan, please notify the physician.     Possible side effects to steroids that may occur:  Flushing or redness of the face  Irritability  Fluid retention  Change in women?s menses    The following medications were used: Depomedrol and Contrast Dye

## 2020-11-30 ENCOUNTER — Encounter: Admit: 2020-11-30 | Discharge: 2020-11-30 | Payer: MEDICARE

## 2020-11-30 ENCOUNTER — Ambulatory Visit: Admit: 2020-11-30 | Discharge: 2020-11-30 | Payer: MEDICARE

## 2020-11-30 DIAGNOSIS — M2550 Pain in unspecified joint: Secondary | ICD-10-CM

## 2020-11-30 DIAGNOSIS — G629 Polyneuropathy, unspecified: Secondary | ICD-10-CM

## 2020-11-30 DIAGNOSIS — U071 COVID-19: Secondary | ICD-10-CM

## 2020-11-30 DIAGNOSIS — M4804 Spinal stenosis, thoracic region: Secondary | ICD-10-CM

## 2020-11-30 DIAGNOSIS — K219 Gastro-esophageal reflux disease without esophagitis: Secondary | ICD-10-CM

## 2020-11-30 DIAGNOSIS — Z853 Personal history of malignant neoplasm of breast: Secondary | ICD-10-CM

## 2020-11-30 DIAGNOSIS — I8390 Asymptomatic varicose veins of unspecified lower extremity: Secondary | ICD-10-CM

## 2020-11-30 DIAGNOSIS — M5136 Other intervertebral disc degeneration, lumbar region: Secondary | ICD-10-CM

## 2020-11-30 DIAGNOSIS — M810 Age-related osteoporosis without current pathological fracture: Secondary | ICD-10-CM

## 2020-11-30 DIAGNOSIS — I38 Endocarditis, valve unspecified: Secondary | ICD-10-CM

## 2020-11-30 DIAGNOSIS — N3020 Other chronic cystitis without hematuria: Secondary | ICD-10-CM

## 2020-11-30 DIAGNOSIS — I10 Essential (primary) hypertension: Secondary | ICD-10-CM

## 2020-11-30 DIAGNOSIS — M5137 Other intervertebral disc degeneration, lumbosacral region: Secondary | ICD-10-CM

## 2020-11-30 DIAGNOSIS — E785 Hyperlipidemia, unspecified: Secondary | ICD-10-CM

## 2020-11-30 DIAGNOSIS — Z1379 Encounter for other screening for genetic and chromosomal anomalies: Secondary | ICD-10-CM

## 2020-11-30 DIAGNOSIS — C801 Malignant (primary) neoplasm, unspecified: Secondary | ICD-10-CM

## 2020-11-30 DIAGNOSIS — E782 Mixed hyperlipidemia: Secondary | ICD-10-CM

## 2020-11-30 DIAGNOSIS — C50212 Malignant neoplasm of upper-inner quadrant of left female breast: Secondary | ICD-10-CM

## 2020-11-30 DIAGNOSIS — M549 Dorsalgia, unspecified: Secondary | ICD-10-CM

## 2020-11-30 DIAGNOSIS — E559 Vitamin D deficiency, unspecified: Secondary | ICD-10-CM

## 2020-11-30 DIAGNOSIS — E162 Hypoglycemia, unspecified: Secondary | ICD-10-CM

## 2020-11-30 DIAGNOSIS — Z9109 Other allergy status, other than to drugs and biological substances: Secondary | ICD-10-CM

## 2020-11-30 DIAGNOSIS — F32A Depression: Secondary | ICD-10-CM

## 2020-11-30 DIAGNOSIS — M3500 Sicca syndrome, unspecified: Secondary | ICD-10-CM

## 2020-11-30 DIAGNOSIS — M1990 Unspecified osteoarthritis, unspecified site: Secondary | ICD-10-CM

## 2020-12-02 ENCOUNTER — Encounter: Admit: 2020-12-02 | Discharge: 2020-12-02 | Payer: MEDICARE

## 2020-12-02 MED ORDER — DULOXETINE 30 MG PO CPDR
ORAL_CAPSULE | Freq: Every day | 0 refills
Start: 2020-12-02 — End: ?

## 2020-12-09 ENCOUNTER — Encounter: Admit: 2020-12-09 | Discharge: 2020-12-09 | Payer: MEDICARE

## 2020-12-09 DIAGNOSIS — I10 Essential (primary) hypertension: Secondary | ICD-10-CM

## 2020-12-09 DIAGNOSIS — Z9109 Other allergy status, other than to drugs and biological substances: Secondary | ICD-10-CM

## 2020-12-09 DIAGNOSIS — Z1379 Encounter for other screening for genetic and chromosomal anomalies: Secondary | ICD-10-CM

## 2020-12-09 DIAGNOSIS — M1990 Unspecified osteoarthritis, unspecified site: Secondary | ICD-10-CM

## 2020-12-09 DIAGNOSIS — U071 COVID-19: Secondary | ICD-10-CM

## 2020-12-09 DIAGNOSIS — E782 Mixed hyperlipidemia: Secondary | ICD-10-CM

## 2020-12-09 DIAGNOSIS — E559 Vitamin D deficiency, unspecified: Secondary | ICD-10-CM

## 2020-12-09 DIAGNOSIS — I38 Endocarditis, valve unspecified: Secondary | ICD-10-CM

## 2020-12-09 DIAGNOSIS — N3020 Other chronic cystitis without hematuria: Secondary | ICD-10-CM

## 2020-12-09 DIAGNOSIS — G629 Polyneuropathy, unspecified: Secondary | ICD-10-CM

## 2020-12-09 DIAGNOSIS — F32A Depression: Secondary | ICD-10-CM

## 2020-12-09 DIAGNOSIS — M3500 Sicca syndrome, unspecified: Secondary | ICD-10-CM

## 2020-12-09 DIAGNOSIS — I8390 Asymptomatic varicose veins of unspecified lower extremity: Secondary | ICD-10-CM

## 2020-12-09 DIAGNOSIS — M810 Age-related osteoporosis without current pathological fracture: Secondary | ICD-10-CM

## 2020-12-09 DIAGNOSIS — Z853 Personal history of malignant neoplasm of breast: Secondary | ICD-10-CM

## 2020-12-09 DIAGNOSIS — M5136 Other intervertebral disc degeneration, lumbar region: Secondary | ICD-10-CM

## 2020-12-09 DIAGNOSIS — E785 Hyperlipidemia, unspecified: Secondary | ICD-10-CM

## 2020-12-09 DIAGNOSIS — E162 Hypoglycemia, unspecified: Secondary | ICD-10-CM

## 2020-12-09 DIAGNOSIS — M2550 Pain in unspecified joint: Secondary | ICD-10-CM

## 2020-12-09 DIAGNOSIS — C801 Malignant (primary) neoplasm, unspecified: Secondary | ICD-10-CM

## 2020-12-09 DIAGNOSIS — C50212 Malignant neoplasm of upper-inner quadrant of left female breast: Secondary | ICD-10-CM

## 2020-12-09 DIAGNOSIS — M549 Dorsalgia, unspecified: Secondary | ICD-10-CM

## 2020-12-09 DIAGNOSIS — K219 Gastro-esophageal reflux disease without esophagitis: Secondary | ICD-10-CM

## 2020-12-10 ENCOUNTER — Encounter: Admit: 2020-12-10 | Discharge: 2020-12-10 | Payer: MEDICARE

## 2020-12-10 NOTE — Telephone Encounter
Kristen Norris with [facility] stating they had not received signed PT Plan of care from 05/12/2020. Asked to please fax to [phone]. PT plan of care located in outside records, previously signed and faxed,  Faxed again per request

## 2020-12-29 ENCOUNTER — Encounter: Admit: 2020-12-29 | Discharge: 2020-12-29 | Payer: MEDICARE

## 2020-12-29 DIAGNOSIS — I10 Essential (primary) hypertension: Secondary | ICD-10-CM

## 2020-12-29 DIAGNOSIS — E785 Hyperlipidemia, unspecified: Secondary | ICD-10-CM

## 2020-12-29 DIAGNOSIS — M810 Age-related osteoporosis without current pathological fracture: Secondary | ICD-10-CM

## 2020-12-29 DIAGNOSIS — I8390 Asymptomatic varicose veins of unspecified lower extremity: Secondary | ICD-10-CM

## 2020-12-29 DIAGNOSIS — U071 COVID-19: Secondary | ICD-10-CM

## 2020-12-29 DIAGNOSIS — C50212 Malignant neoplasm of upper-inner quadrant of left female breast: Secondary | ICD-10-CM

## 2020-12-29 DIAGNOSIS — M3500 Sicca syndrome, unspecified: Secondary | ICD-10-CM

## 2020-12-29 DIAGNOSIS — N3020 Other chronic cystitis without hematuria: Secondary | ICD-10-CM

## 2020-12-29 DIAGNOSIS — M549 Dorsalgia, unspecified: Secondary | ICD-10-CM

## 2020-12-29 DIAGNOSIS — M5136 Other intervertebral disc degeneration, lumbar region: Secondary | ICD-10-CM

## 2020-12-29 DIAGNOSIS — M1990 Unspecified osteoarthritis, unspecified site: Secondary | ICD-10-CM

## 2020-12-29 DIAGNOSIS — M4804 Spinal stenosis, thoracic region: Secondary | ICD-10-CM

## 2020-12-29 DIAGNOSIS — M2550 Pain in unspecified joint: Secondary | ICD-10-CM

## 2020-12-29 DIAGNOSIS — I38 Endocarditis, valve unspecified: Secondary | ICD-10-CM

## 2020-12-29 DIAGNOSIS — E782 Mixed hyperlipidemia: Secondary | ICD-10-CM

## 2020-12-29 DIAGNOSIS — Z853 Personal history of malignant neoplasm of breast: Secondary | ICD-10-CM

## 2020-12-29 DIAGNOSIS — E162 Hypoglycemia, unspecified: Secondary | ICD-10-CM

## 2020-12-29 DIAGNOSIS — Z1379 Encounter for other screening for genetic and chromosomal anomalies: Secondary | ICD-10-CM

## 2020-12-29 DIAGNOSIS — K219 Gastro-esophageal reflux disease without esophagitis: Secondary | ICD-10-CM

## 2020-12-29 DIAGNOSIS — F32A Depression: Secondary | ICD-10-CM

## 2020-12-29 DIAGNOSIS — Z9109 Other allergy status, other than to drugs and biological substances: Secondary | ICD-10-CM

## 2020-12-29 DIAGNOSIS — C801 Malignant (primary) neoplasm, unspecified: Secondary | ICD-10-CM

## 2020-12-29 DIAGNOSIS — G629 Polyneuropathy, unspecified: Secondary | ICD-10-CM

## 2020-12-29 DIAGNOSIS — E559 Vitamin D deficiency, unspecified: Secondary | ICD-10-CM

## 2021-01-11 ENCOUNTER — Encounter: Admit: 2021-01-11 | Discharge: 2021-01-11 | Payer: MEDICARE

## 2021-01-11 ENCOUNTER — Ambulatory Visit: Admit: 2021-01-11 | Discharge: 2021-01-11 | Payer: MEDICARE

## 2021-01-11 DIAGNOSIS — I10 Essential (primary) hypertension: Secondary | ICD-10-CM

## 2021-01-11 DIAGNOSIS — C50212 Malignant neoplasm of upper-inner quadrant of left female breast: Secondary | ICD-10-CM

## 2021-01-11 DIAGNOSIS — E785 Hyperlipidemia, unspecified: Secondary | ICD-10-CM

## 2021-01-11 DIAGNOSIS — C801 Malignant (primary) neoplasm, unspecified: Secondary | ICD-10-CM

## 2021-01-11 DIAGNOSIS — G629 Polyneuropathy, unspecified: Secondary | ICD-10-CM

## 2021-01-11 DIAGNOSIS — F32A Depression: Secondary | ICD-10-CM

## 2021-01-11 DIAGNOSIS — M2550 Pain in unspecified joint: Secondary | ICD-10-CM

## 2021-01-11 DIAGNOSIS — I38 Endocarditis, valve unspecified: Secondary | ICD-10-CM

## 2021-01-11 DIAGNOSIS — E782 Mixed hyperlipidemia: Secondary | ICD-10-CM

## 2021-01-11 DIAGNOSIS — I8390 Asymptomatic varicose veins of unspecified lower extremity: Secondary | ICD-10-CM

## 2021-01-11 DIAGNOSIS — M4804 Spinal stenosis, thoracic region: Secondary | ICD-10-CM

## 2021-01-11 DIAGNOSIS — Z853 Personal history of malignant neoplasm of breast: Secondary | ICD-10-CM

## 2021-01-11 DIAGNOSIS — Z9109 Other allergy status, other than to drugs and biological substances: Secondary | ICD-10-CM

## 2021-01-11 DIAGNOSIS — M549 Dorsalgia, unspecified: Secondary | ICD-10-CM

## 2021-01-11 DIAGNOSIS — U071 COVID-19: Secondary | ICD-10-CM

## 2021-01-11 DIAGNOSIS — N3020 Other chronic cystitis without hematuria: Secondary | ICD-10-CM

## 2021-01-11 DIAGNOSIS — M1990 Unspecified osteoarthritis, unspecified site: Secondary | ICD-10-CM

## 2021-01-11 DIAGNOSIS — M3500 Sicca syndrome, unspecified: Secondary | ICD-10-CM

## 2021-01-11 DIAGNOSIS — E559 Vitamin D deficiency, unspecified: Secondary | ICD-10-CM

## 2021-01-11 DIAGNOSIS — E162 Hypoglycemia, unspecified: Secondary | ICD-10-CM

## 2021-01-11 DIAGNOSIS — R29898 Other symptoms and signs involving the musculoskeletal system: Secondary | ICD-10-CM

## 2021-01-11 DIAGNOSIS — M5136 Other intervertebral disc degeneration, lumbar region: Secondary | ICD-10-CM

## 2021-01-11 DIAGNOSIS — Z1379 Encounter for other screening for genetic and chromosomal anomalies: Secondary | ICD-10-CM

## 2021-01-11 DIAGNOSIS — K219 Gastro-esophageal reflux disease without esophagitis: Secondary | ICD-10-CM

## 2021-01-11 DIAGNOSIS — M810 Age-related osteoporosis without current pathological fracture: Secondary | ICD-10-CM

## 2021-01-13 ENCOUNTER — Ambulatory Visit: Admit: 2021-01-13 | Discharge: 2021-01-13 | Payer: MEDICARE

## 2021-01-13 ENCOUNTER — Encounter: Admit: 2021-01-13 | Discharge: 2021-01-13 | Payer: MEDICARE

## 2021-01-13 DIAGNOSIS — E782 Mixed hyperlipidemia: Secondary | ICD-10-CM

## 2021-01-13 DIAGNOSIS — M5136 Other intervertebral disc degeneration, lumbar region: Secondary | ICD-10-CM

## 2021-01-13 DIAGNOSIS — G629 Polyneuropathy, unspecified: Secondary | ICD-10-CM

## 2021-01-13 DIAGNOSIS — U071 COVID-19: Secondary | ICD-10-CM

## 2021-01-13 DIAGNOSIS — Z1379 Encounter for other screening for genetic and chromosomal anomalies: Secondary | ICD-10-CM

## 2021-01-13 DIAGNOSIS — M2550 Pain in unspecified joint: Secondary | ICD-10-CM

## 2021-01-13 DIAGNOSIS — E785 Hyperlipidemia, unspecified: Secondary | ICD-10-CM

## 2021-01-13 DIAGNOSIS — M533 Sacrococcygeal disorders, not elsewhere classified: Secondary | ICD-10-CM

## 2021-01-13 DIAGNOSIS — M48062 Spinal stenosis, lumbar region with neurogenic claudication: Secondary | ICD-10-CM

## 2021-01-13 DIAGNOSIS — N3020 Other chronic cystitis without hematuria: Secondary | ICD-10-CM

## 2021-01-13 DIAGNOSIS — I8390 Asymptomatic varicose veins of unspecified lower extremity: Secondary | ICD-10-CM

## 2021-01-13 DIAGNOSIS — C801 Malignant (primary) neoplasm, unspecified: Secondary | ICD-10-CM

## 2021-01-13 DIAGNOSIS — I38 Endocarditis, valve unspecified: Secondary | ICD-10-CM

## 2021-01-13 DIAGNOSIS — E559 Vitamin D deficiency, unspecified: Secondary | ICD-10-CM

## 2021-01-13 DIAGNOSIS — Z853 Personal history of malignant neoplasm of breast: Secondary | ICD-10-CM

## 2021-01-13 DIAGNOSIS — K219 Gastro-esophageal reflux disease without esophagitis: Secondary | ICD-10-CM

## 2021-01-13 DIAGNOSIS — M5414 Radiculopathy, thoracic region: Secondary | ICD-10-CM

## 2021-01-13 DIAGNOSIS — F32A Depression: Secondary | ICD-10-CM

## 2021-01-13 DIAGNOSIS — Z9109 Other allergy status, other than to drugs and biological substances: Secondary | ICD-10-CM

## 2021-01-13 DIAGNOSIS — E162 Hypoglycemia, unspecified: Secondary | ICD-10-CM

## 2021-01-13 DIAGNOSIS — M810 Age-related osteoporosis without current pathological fracture: Secondary | ICD-10-CM

## 2021-01-13 DIAGNOSIS — M1990 Unspecified osteoarthritis, unspecified site: Secondary | ICD-10-CM

## 2021-01-13 DIAGNOSIS — M3500 Sicca syndrome, unspecified: Secondary | ICD-10-CM

## 2021-01-13 DIAGNOSIS — I10 Essential (primary) hypertension: Secondary | ICD-10-CM

## 2021-01-13 DIAGNOSIS — R29898 Other symptoms and signs involving the musculoskeletal system: Secondary | ICD-10-CM

## 2021-01-13 DIAGNOSIS — C50212 Malignant neoplasm of upper-inner quadrant of left female breast: Secondary | ICD-10-CM

## 2021-01-13 DIAGNOSIS — M549 Dorsalgia, unspecified: Secondary | ICD-10-CM

## 2021-01-13 MED ORDER — TRAMADOL 50 MG PO TAB
50 mg | ORAL_TABLET | ORAL | 0 refills | Status: AC | PRN
Start: 2021-01-13 — End: ?

## 2021-01-13 NOTE — Progress Notes
[facility] CLINIC NOTE       SUBJECTIVE:   Chronic back pain   Recent eval with surgery team, Dr Jean Rosenthal  Discussed options but not deemd a good surgical candidate   She returns for eval   She is feeling better overall in that her weakness and falls have been less severe/less constant   She still uses a support device    Was having difficulty in past with stairs   She had SI joint inj with moderate relief to help her improve this activity   Currently finds she still has pain along the lower back mostly   Does have travelling pain to hip/buttock area   Uses cane as caution   ?  MEDS   Gabapentin 300mg  - reduced to 1 daily  Amitriptyline ?  Duloxetine  Tramadol sparingly   ?  TRX   SI joing inj 04/27/20 and 11/18/21with 70% relief for 6 weeks   CESI 10/03/19  TFESI l4-5 11/27/20 and 08/29/19 with > 50% relief  PT completed at [facility] x 2 rounds, and cont to do home PT?  ?         Review of Systems    Current Outpatient Medications:   ?  atorvastatin (LIPITOR) 20 mg tablet, Take 1 tablet by mouth once daily, Disp: 90 tablet, Rfl: 3  ?  benzonatate (TESSALON PERLES) 100 mg capsule, Take 100 mg by mouth every 8 hours as needed for Cough., Disp: , Rfl:   ?  CALCIUM CARBONATE (CALCIUM 500 PO), Take 1 Cap by mouth Daily., Disp: , Rfl:   ?  Cholecalciferol (VITAMIN D3) 125 mcg (5,000 unit) capsule, Take 10,000 Units by mouth daily., Disp: , Rfl:   ?  duloxetine DR (CYMBALTA) 60 mg capsule, Take one capsule by mouth daily., Disp: 90 capsule, Rfl: 1  ?  flecainide (TAMBOCOR) 50 mg tablet, Take 1 tablet by mouth twice daily, Disp: 60 tablet, Rfl: 3  ?  hydrOXYchloroQUINE (PLAQUENIL) 200 mg tablet, Take 1.5 tablets by mouth daily. Take with food., Disp: 135 tablet, Rfl: 0  ?  hyoscyamine sulfate (LEVSIN/SL) 0.125 mg sublingual tablet, Place one tablet under tongue every 4 hours as needed. Indications: Bladder spasms, Disp: 30 tablet, Rfl: 1  ?  metoprolol XL (TOPROL XL) 50 mg extended release tablet, Take 1 tablet by mouth once daily, Disp: 30 tablet, Rfl: 11  ?  oxybutynin XL (DITROPAN XL) 10 mg tablet, TAKE 1 TABLET BY MOUTH ONCE DAILY DO  NOT  CUT  CRUSH  OR  CHEW, Disp: 90 tablet, Rfl: 3  ?  pilocarpine HCL (SALAGEN (PILOCARPINE)) 5 mg tablet, Take one tablet by mouth three times daily., Disp: 90 tablet, Rfl: 2  ?  polyethylene glycol 3350 (MIRALAX) 17 gram/dose powder, Take seventeen g by mouth daily., Disp: 527 g, Rfl: 1  ?  senna/docusate (SENOKOT-S) 8.6/50 mg tablet, Take one tablet by mouth daily. Indications: constipation, Disp: 30 tablet, Rfl: 1  ?  traMADoL (ULTRAM) 50 mg tablet, Take one tablet by mouth every 4 hours as needed for Pain. Indications: neuropathic pain, pain, Disp: 30 tablet, Rfl: 0  Allergies   Allergen Reactions   ? Sulfa (Sulfonamide Antibiotics) RASH and NAUSEA AND VOMITING     Allergy recorded in SMS: Sulfa     Physical Exam  Vitals:    01/13/21 0916   BP: 136/72   Pulse: 86   SpO2: 98%   PainSc: Two   Weight: 62.6 kg (138 lb)   Height: 157.5  cm (5' 2)     Oswestry Total Score:: 34  Pain Score: Two  Body mass index is 25.24 kg/m?Marland Kitchen    General: Alert, oriented, mild distress  HEENT: normocephalic  NECK: supple  Resp: Non labored breathing, no distress  Cardio: Pedal pulses palpable bilaterally, no lower extremity edema  MS: TTP in lumbar region of spine and paraspinal muscles. bil SI joint TTP, left greater than right, + gaenslin. + Faber  NEURO: Cranial nerves II-XII intact. Motor strength with genearlized weakness lower ext, sensory exams unchanged. . Gait antlagic, cane for support.   Behavior: Calm, cooperative, behavior and speech normal.         IMPRESSION:  1. Sacroiliac joint dysfunction    2. Lumbar stenosis with neurogenic claudication    3. Thoracic radiculopathy    4. Weakness of both lower extremities          PLAN:    Plan refill tramadol   Cont other meds   Suggest SI joint inj - since she had good relief with this in past   This can help determine source for future trx     Recent LESI with good relief but has different pain along SI area as well   Since she is not a good surgical candidate with ortho spine, May consider eval for lesser aggressive decompression options  Will follow up after SI trx to assess her response   Consider MILD, vs spacer vs SI fusion in future

## 2021-01-16 ENCOUNTER — Encounter: Admit: 2021-01-16 | Discharge: 2021-01-16 | Payer: MEDICARE

## 2021-01-16 NOTE — Telephone Encounter
Pt paged the [facility]PS pager this afternoon. Pt reports that she is scheduled to have an epidural injection on Monday December 12th with Dr. Humphrey Rolls. She recently was diagnosed with a UTI and started on antibiotics. She is taking a 7 day course which will finish up on 12/14. She asked if this would interfere with her ability to go forward with the procedure. I spoke with the fellow on call who said to have the patient call the clinic first thing on Monday morning to ask if she should still make the drive to the clinic or if the appointment needs to be postponed.  Pt voiced understanding.    Helen Hashimoto, MD  Anesthesiology PGY-2

## 2021-01-18 ENCOUNTER — Encounter: Admit: 2021-01-18 | Discharge: 2021-01-18 | Payer: MEDICARE

## 2021-01-18 DIAGNOSIS — E785 Hyperlipidemia, unspecified: Secondary | ICD-10-CM

## 2021-01-18 DIAGNOSIS — K219 Gastro-esophageal reflux disease without esophagitis: Secondary | ICD-10-CM

## 2021-01-18 DIAGNOSIS — M3500 Sicca syndrome, unspecified: Secondary | ICD-10-CM

## 2021-01-18 DIAGNOSIS — E782 Mixed hyperlipidemia: Secondary | ICD-10-CM

## 2021-01-18 DIAGNOSIS — M2550 Pain in unspecified joint: Secondary | ICD-10-CM

## 2021-01-18 DIAGNOSIS — M810 Age-related osteoporosis without current pathological fracture: Secondary | ICD-10-CM

## 2021-01-18 DIAGNOSIS — Z1379 Encounter for other screening for genetic and chromosomal anomalies: Secondary | ICD-10-CM

## 2021-01-18 DIAGNOSIS — N3020 Other chronic cystitis without hematuria: Secondary | ICD-10-CM

## 2021-01-18 DIAGNOSIS — I8390 Asymptomatic varicose veins of unspecified lower extremity: Secondary | ICD-10-CM

## 2021-01-18 DIAGNOSIS — E559 Vitamin D deficiency, unspecified: Secondary | ICD-10-CM

## 2021-01-18 DIAGNOSIS — E162 Hypoglycemia, unspecified: Secondary | ICD-10-CM

## 2021-01-18 DIAGNOSIS — I10 Essential (primary) hypertension: Secondary | ICD-10-CM

## 2021-01-18 DIAGNOSIS — M533 Sacrococcygeal disorders, not elsewhere classified: Secondary | ICD-10-CM

## 2021-01-18 DIAGNOSIS — U071 COVID-19: Secondary | ICD-10-CM

## 2021-01-18 DIAGNOSIS — C50212 Malignant neoplasm of upper-inner quadrant of left female breast: Secondary | ICD-10-CM

## 2021-01-18 DIAGNOSIS — M5136 Other intervertebral disc degeneration, lumbar region: Secondary | ICD-10-CM

## 2021-01-18 DIAGNOSIS — Z9109 Other allergy status, other than to drugs and biological substances: Secondary | ICD-10-CM

## 2021-01-18 DIAGNOSIS — G629 Polyneuropathy, unspecified: Secondary | ICD-10-CM

## 2021-01-18 DIAGNOSIS — M549 Dorsalgia, unspecified: Secondary | ICD-10-CM

## 2021-01-18 DIAGNOSIS — C801 Malignant (primary) neoplasm, unspecified: Secondary | ICD-10-CM

## 2021-01-18 DIAGNOSIS — F32A Depression: Secondary | ICD-10-CM

## 2021-01-18 DIAGNOSIS — M1990 Unspecified osteoarthritis, unspecified site: Secondary | ICD-10-CM

## 2021-01-18 DIAGNOSIS — Z853 Personal history of malignant neoplasm of breast: Secondary | ICD-10-CM

## 2021-01-18 DIAGNOSIS — I38 Endocarditis, valve unspecified: Secondary | ICD-10-CM

## 2021-01-18 NOTE — Telephone Encounter
Attempt(s) to Contact:  - 01/18/21: Spoke to pt, PVP complete    Initial Appointment: 02/04/21  Reason for Visit: Numbness in legs     Physician Information  Primary Care Physician:    - Name: Zack Seal   - Location: Fax [phone]  Referring Physician:    - Name: Marton Redwood, NP   -Location: [facility]   Previous Neurologist:     - Name: Dr. Sanda Klein    - Location: [facility] 11/09/2018  Other relevant providers:    - Name: None   - Location: N/A    Any Previous Imaging/Procedures of Head/Neck/Spine  MRI/MRA:   MRI T-Spine 01/11/21  1. ?No acute findings.   2. ?Thoracic spondylosis with multilevel foraminal stenosis, at least moderate at T8-T9 the right and mild elsewhere. Mild multilevel disc bulge without significant spinal canal stenosis.   MRI L-Spine 11/25/19  1. ?Multilevel degenerative central spinal stenosis, greatest of moderate degree at L4-L5.   2. ?Multilevel degenerative foraminal, greatest moderate degree at L4-L5 and L5-S1 as detailed.   MRI T-Spine 01/06/19  1. ?Mild compression of the superior plate of T2 and minimal compression of the superior endplate of T3 without marrow edema. These are unchanged from prior exams consistent with chronic compressions.   2. ?Normal appearance of the thoracic cord without abnormal enhancement.   3. ?Mild multilevel disc disease with multiple tiny disc protrusions. No large protrusion or central stenosis.   MRI Head 02/11/13: Unremarkable enhanced and unenhanced  MRI L-Spine 04/09/2010  1. 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   2. THERE IS MILD ANTERIOR SPURRING AT L1-L2 WITH MODERATE ENDPLATE CHANGES IN THE ANTEROSUPERIOR ASPECT OF THE L2 VERTEBRAL BODY AS DESCRIBED. MOST LIKELY THIS REPRESENTS REACTIVE DEGENERATIVE CHANGES. NEOPLASM IS FELT TO BE LESS LIKELY.  CT/CTA:N/A  X-RAY: N/A  EMG:N/A  EEG:N/A  SLEEP STUDY: N/A  NEUROPSYCH TESTING:N/A  LUMBAR PUNCTURE:N/A  LABS:11/14/20    Any Hospitalizations or ER visits within the last 3 years   Hospitalizations: None  Emergency Department Visits: None    Requested Records  - None     Medications, Drug Allergies, Medical/Surgical/Social History Updated on Chart w/ Patient

## 2021-01-25 ENCOUNTER — Encounter: Admit: 2021-01-25 | Discharge: 2021-01-25 | Payer: MEDICARE

## 2021-01-25 ENCOUNTER — Ambulatory Visit: Admit: 2021-01-25 | Discharge: 2021-01-25 | Payer: MEDICARE

## 2021-01-25 DIAGNOSIS — G629 Polyneuropathy, unspecified: Secondary | ICD-10-CM

## 2021-01-25 DIAGNOSIS — I10 Essential (primary) hypertension: Secondary | ICD-10-CM

## 2021-01-25 DIAGNOSIS — E162 Hypoglycemia, unspecified: Secondary | ICD-10-CM

## 2021-01-25 DIAGNOSIS — I38 Endocarditis, valve unspecified: Secondary | ICD-10-CM

## 2021-01-25 DIAGNOSIS — K219 Gastro-esophageal reflux disease without esophagitis: Secondary | ICD-10-CM

## 2021-01-25 DIAGNOSIS — E559 Vitamin D deficiency, unspecified: Secondary | ICD-10-CM

## 2021-01-25 DIAGNOSIS — M810 Age-related osteoporosis without current pathological fracture: Secondary | ICD-10-CM

## 2021-01-25 DIAGNOSIS — M533 Sacrococcygeal disorders, not elsewhere classified: Secondary | ICD-10-CM

## 2021-01-25 DIAGNOSIS — Z853 Personal history of malignant neoplasm of breast: Secondary | ICD-10-CM

## 2021-01-25 DIAGNOSIS — E785 Hyperlipidemia, unspecified: Secondary | ICD-10-CM

## 2021-01-25 DIAGNOSIS — U071 COVID-19: Secondary | ICD-10-CM

## 2021-01-25 DIAGNOSIS — M5136 Other intervertebral disc degeneration, lumbar region: Secondary | ICD-10-CM

## 2021-01-25 DIAGNOSIS — M3500 Sicca syndrome, unspecified: Secondary | ICD-10-CM

## 2021-01-25 DIAGNOSIS — N3020 Other chronic cystitis without hematuria: Secondary | ICD-10-CM

## 2021-01-25 DIAGNOSIS — I8390 Asymptomatic varicose veins of unspecified lower extremity: Secondary | ICD-10-CM

## 2021-01-25 DIAGNOSIS — M1990 Unspecified osteoarthritis, unspecified site: Secondary | ICD-10-CM

## 2021-01-25 DIAGNOSIS — M2550 Pain in unspecified joint: Secondary | ICD-10-CM

## 2021-01-25 DIAGNOSIS — Z9109 Other allergy status, other than to drugs and biological substances: Secondary | ICD-10-CM

## 2021-01-25 DIAGNOSIS — C801 Malignant (primary) neoplasm, unspecified: Secondary | ICD-10-CM

## 2021-01-25 DIAGNOSIS — E782 Mixed hyperlipidemia: Secondary | ICD-10-CM

## 2021-01-25 DIAGNOSIS — Z1379 Encounter for other screening for genetic and chromosomal anomalies: Secondary | ICD-10-CM

## 2021-01-25 DIAGNOSIS — F32A Depression: Secondary | ICD-10-CM

## 2021-01-25 DIAGNOSIS — C50212 Malignant neoplasm of upper-inner quadrant of left female breast: Secondary | ICD-10-CM

## 2021-01-25 DIAGNOSIS — M549 Dorsalgia, unspecified: Secondary | ICD-10-CM

## 2021-01-25 MED ORDER — METHYLPREDNISOLONE ACETATE 40 MG/ML IJ SUSP
40 mg | Freq: Once | INTRAMUSCULAR | 0 refills | Status: CP
Start: 2021-01-25 — End: ?

## 2021-01-25 MED ORDER — LIDOCAINE (PF) 10 MG/ML (1 %) IJ SOLN
2 mL | Freq: Once | INTRAMUSCULAR | 0 refills | Status: CP
Start: 2021-01-25 — End: ?

## 2021-01-25 MED ORDER — IOHEXOL 240 MG IODINE/ML IV SOLN
1 mL | Freq: Once | EPIDURAL | 0 refills | Status: CP
Start: 2021-01-25 — End: ?

## 2021-01-25 NOTE — Progress Notes
[facility]INE [facility]  INTERVENTIONAL PAIN PROCEDURE HISTORY AND PHYSICAL    Chief Complaint: Pain    HISTORY OF PRESENT ILLNESS:  pain    Medical History:   Diagnosis Date   ? Arthritis    ? Back pain 07/18/2007   ? Back pain    ? Bladder infection, chronic 2017   ? Chest pain    ? COVID-19 12/28/2018   ? Degenerative disc disease, lumbar     Lo   ? Depression    ? Elevated triglycerides with high cholesterol    ? Environmental allergies    ? Essential hypertension     When in traffic   ? 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 Hereditary Cancer Update Test - [place])   ? GERD (gastroesophageal reflux disease)    ? Heart valve disease 2020   ? Hyperlipemia    ? Hypoglycemia    ? Joint pain    ? 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 Neuropathy    ? Osteoporosis 03/2007    BMD  T-score (-2.3) spine and (-2.9) femur on Actonel   ? Other malignant neoplasm without specification of site    ? Personal history of malignant neoplasm of breast    ? PN (Peripheral Neuropathy)    ? Sjogren's disease (HCC)    ? Varicose veins 2019   ? Vitamin D deficiency 07/2007       Surgical History:   Procedure Laterality Date   ? CARDIAC CATHERIZATION  03/28/2005    LAD 20%, Circ ostial 30%, EF 65%   ? HX BREAST LUMPECTOMY  06/07/2005    Left with SLNBX (0/2)   ? COLONOSCOPY N/A 03/01/2010    normal ([name], MD-[facility])   ? HX HYSTERECTOMY  03/24/2011    [name], MD [facility]/BSO   ? Excision Posterior Neck mass N/A 09/04/2020    Performed by Freund, [name]., MD at [facility] OR   ? anterior colporrhaphy (cystocele repair);  Posterior colporrhaphy (rectocele repair); N/A 11/13/2020    Performed by [name], MD at [facility] OR   ? CYSTOURETHROSCOPY N/A 11/13/2020    Performed by [name], MD at [facility] OR   ? HX BLADDER SUSPENSION     ? HX TONSILLECTOMY  1979   ? HX TUBAL LIGATION     ? KNEE SURGERY  ]999   ? PR LAPAROSCOPY SURG RPR INITIAL INGUINAL HERNIA  1982       family history includes Arthritis in her father; Brain Tumor (age of onset: 66) in an other family member; COPD in her daughter; Essie Christine (age of onset: 35) in an other family member; Physiological scientist (age of onset: 67) in an other family member; Physiological scientist (age of onset: 4) in her paternal aunt; Cancer-Breast (age of onset: 10) in an other family member; Cancer-Hematologic (age of onset: 35) in her daughter; Teresa Coombs (age of onset: 61) in her paternal uncle; Cancer-Lung (age of onset: 38) in an other family member; Cancer-Ovarian (age of onset: 62) in an other family member; Complete Hysterectomy in her sister; Complete Hysterectomy (age of onset: 49) in her daughter; Coronary Artery Disease in her father; Depression (age of onset: 53) in her brother; Diabetes Type II in her maternal grandmother; Heart Attack (age of onset: 79) in her brother; Heart  Disease in her father, mother, and sister; Heart murmur in her sister; Hypoglycemia in her mother; None Reported in her grandchild, grandchild, grandchild, grandchild, grandchild, sister, and another family member; Osteopenia in her sister; Other in her grandchild; Other (age of onset: 0) in her brother; Other (age of onset: 55) in her brother; Other (age of onset: 50) in her sister; Tobacco disorder in her daughter.    Social History     Socioeconomic History   ? Marital status: Divorced   ? Number of children: 2   ? Highest education level: GED or equivalent   Occupational History   ? Occupation: grounds Human resources officer: [name]   Tobacco Use   ? Smoking status: Former     Packs/day: 1.00     Years: 14.00     Pack years: 14.00     Types: Cigarettes     Quit date: 04/07/2000     Years since quitting: 20.8   ? Smokeless tobacco: Never   Vaping Use   ? Vaping Use: Never used   Substance and Sexual Activity   ? Alcohol use: Not Currently   ? Drug use: No   ? Sexual activity: Never       Allergies   Allergen Reactions   ? Sulfa (Sulfonamide Antibiotics) RASH and NAUSEA AND VOMITING     Allergy recorded in SMS: Sulfa       Vitals:    01/25/21 1349   BP: 139/78   BP Source: Arm, Right Upper   Temp: 36.7 ?C (98.1 ?F)   SpO2: 95%   O2 Device: None (Room air)             REVIEW OF SYSTEMS: 10 point ROS obtained and negative except pain      PHYSICAL EXAM:unchanged        IMPRESSION:    1. Sacroiliac joint dysfunction         PLAN: Other left SI joint inj

## 2021-01-25 NOTE — Procedures
Attending Surgeon: Rushie Nyhan, MD    Anesthesia: Local    Pre-Procedure Diagnosis:   1. Sacroiliac joint dysfunction        Post-Procedure Diagnosis:   1. Sacroiliac joint dysfunction             SI Joint Inject Anesth/Steroid (Procedure)  Location: sacroiliac joint L sacroiliac joint12/19/2022 1:07 PM    Consent:   Consent obtained: written  Consent given by: patient  Risks discussed: bleeding, infection, nerve damage, pain and soft tissue reaction  Alternatives discussed: alternative treatment, delayed treatment, no treatment and referral     Universal Protocol:  Relevant documents: relevant documents present and verified  Test results: test results available and properly labeled  Imaging studies: imaging studies available  Required items: required blood products, implants, devices, and special equipment available  Site marked: the operative site was marked  Patient identity confirmed: Patient identify confirmed verbally with patient.        Time out: Immediately prior to procedure a "time out" was called to verify the correct patient, procedure, equipment, support staff and site/side marked as required      Procedures Details:   Indications: pain and diagnostic evaluation   Prep: povidone-iodine  Guidance: fluoroscopy  Needle size: 22 G  Approach: posterior  Aspirate: clear  Patient tolerance: Patient tolerated the procedure well with no immediate complications. Pressure was applied, and hemostasis was accomplished.  Comments: 35ml bupi and 80mg  depo            Estimated blood loss: none or minimal  Specimens: none  Patient tolerated the procedure well with no immediate complications. Pressure was applied, and hemostasis was accomplished.

## 2021-01-25 NOTE — Discharge Instructions - Supplementary Instructions
.  GENERAL POST PROCEDURE INSTRUCTIONS    Time:____________________  Physician:_________________________________    Procedure Completed Today:  Joint Injection (hip, knee, shoulder)  Cervical Epidural Steroid Injection  Cervical Transforaminal Steroid Injection  Trigger Point Injection  Other___________________________ Thoracic Epidural Steroid Injection  Lumbar Epidural Steroid Injection  Lumbar Transforaminal Steroid Injection  Facet Joint Injection     Important information following your procedure today:  You may drive today     If you had sedation ,you may NOT drive today  Rest at home for the next 6 hours.  You may then begin to resume your normal activities.  DO NOT drive any vehicle, operate any power tools, drink alcohol, make any major decisions, or sign any legal documents for the next 12 hours.  Pain relief may not be immediate. It is possible you may even experience an increase in pain during the first 24-48 hours followed by a gradual decrease of your pain.  Though the procedure is generally safe and complications are rare, we do ask that you be aware of any of the following:  Any swelling, persistent redness, new bleeding or drainage from the site of the injection.  You should not experience a severe headache.  You should not run a fever over 101oF.  New onset of sharp, severe back and or neck pain.  New onset of upper or lower extremity numbness or weakness.  New difficulty controlling bowel or bladder function after injection.  New shortness of breath.  If any of these occur, please call to report this occurrence to a nurse at [phone]. If you are calling after 4:00pm, on the weekends or holidays please call [phone] and ask to have the pain management resident physician on call for the physician paged or go to your local emergency room.   You may experience soreness at the injection site. Ice can be applied at 20 minute intervals for the first 24 hours. The following day you may alternate ice with heat if you are experiencing muscle tightness, otherwise continue with ice. Ice works best at decreasing pain. Avoid application of direct heat, hot showers or hot tubs today.  Avoid strenuous activity today. You many resume your regular activities and exercise tomorrow.  Patients with diabetes may see an elevation in blood sugars for 7-10 days after the injection. It is important to pay close attention to you diet, check your blood sugars daily and repost extreme elevations to the physician that treats your diabetes.  Patients taking daily blood thinners can resume their regular dose this evening.  It is important that you take all medications ordered by your pain physician. Taking medications as ordered is an important part of you pain care plan. If you cannot continue the medication plan, please notify the physician.  Possible side effects to steroids that may occur:  Flushing or redness of the face  Irritability  Fluid retention  Change in women's menses  Follow up appointment as needed if in the event you are unable to keep an appointment please notify the scheduler 24 hours in advance at [phone].    ____________________________________  ____________________________________

## 2021-02-04 ENCOUNTER — Encounter: Admit: 2021-02-04 | Discharge: 2021-02-04 | Payer: MEDICARE

## 2021-03-10 ENCOUNTER — Encounter: Admit: 2021-03-10 | Discharge: 2021-03-10 | Payer: MEDICARE

## 2021-03-10 DIAGNOSIS — F32A Depression: Secondary | ICD-10-CM

## 2021-03-10 DIAGNOSIS — E162 Hypoglycemia, unspecified: Secondary | ICD-10-CM

## 2021-03-10 DIAGNOSIS — M5136 Other intervertebral disc degeneration, lumbar region: Secondary | ICD-10-CM

## 2021-03-10 DIAGNOSIS — Z853 Personal history of malignant neoplasm of breast: Secondary | ICD-10-CM

## 2021-03-10 DIAGNOSIS — Z1379 Encounter for other screening for genetic and chromosomal anomalies: Secondary | ICD-10-CM

## 2021-03-10 DIAGNOSIS — K219 Gastro-esophageal reflux disease without esophagitis: Secondary | ICD-10-CM

## 2021-03-10 DIAGNOSIS — C801 Malignant (primary) neoplasm, unspecified: Secondary | ICD-10-CM

## 2021-03-10 DIAGNOSIS — M1990 Unspecified osteoarthritis, unspecified site: Secondary | ICD-10-CM

## 2021-03-10 DIAGNOSIS — M549 Dorsalgia, unspecified: Secondary | ICD-10-CM

## 2021-03-10 DIAGNOSIS — G629 Polyneuropathy, unspecified: Secondary | ICD-10-CM

## 2021-03-10 DIAGNOSIS — I38 Endocarditis, valve unspecified: Secondary | ICD-10-CM

## 2021-03-10 DIAGNOSIS — E785 Hyperlipidemia, unspecified: Secondary | ICD-10-CM

## 2021-03-10 DIAGNOSIS — M2550 Pain in unspecified joint: Secondary | ICD-10-CM

## 2021-03-10 DIAGNOSIS — E782 Mixed hyperlipidemia: Secondary | ICD-10-CM

## 2021-03-10 DIAGNOSIS — Z9109 Other allergy status, other than to drugs and biological substances: Secondary | ICD-10-CM

## 2021-03-10 DIAGNOSIS — N3020 Other chronic cystitis without hematuria: Secondary | ICD-10-CM

## 2021-03-10 DIAGNOSIS — U071 COVID-19: Secondary | ICD-10-CM

## 2021-03-10 DIAGNOSIS — I8390 Asymptomatic varicose veins of unspecified lower extremity: Secondary | ICD-10-CM

## 2021-03-10 DIAGNOSIS — E559 Vitamin D deficiency, unspecified: Secondary | ICD-10-CM

## 2021-03-10 DIAGNOSIS — M3500 Sicca syndrome, unspecified: Secondary | ICD-10-CM

## 2021-03-10 DIAGNOSIS — C50212 Malignant neoplasm of upper-inner quadrant of left female breast: Secondary | ICD-10-CM

## 2021-03-10 DIAGNOSIS — I10 Essential (primary) hypertension: Secondary | ICD-10-CM

## 2021-03-10 DIAGNOSIS — M810 Age-related osteoporosis without current pathological fracture: Secondary | ICD-10-CM

## 2021-03-11 ENCOUNTER — Encounter: Admit: 2021-03-11 | Discharge: 2021-03-11 | Payer: MEDICARE

## 2021-03-11 ENCOUNTER — Ambulatory Visit: Admit: 2021-03-11 | Discharge: 2021-03-12 | Payer: MEDICARE

## 2021-03-11 DIAGNOSIS — E559 Vitamin D deficiency, unspecified: Secondary | ICD-10-CM

## 2021-03-11 DIAGNOSIS — I471 Supraventricular tachycardia: Secondary | ICD-10-CM

## 2021-03-11 DIAGNOSIS — E782 Mixed hyperlipidemia: Secondary | ICD-10-CM

## 2021-03-11 DIAGNOSIS — I447 Left bundle-branch block, unspecified: Secondary | ICD-10-CM

## 2021-03-11 DIAGNOSIS — M1990 Unspecified osteoarthritis, unspecified site: Secondary | ICD-10-CM

## 2021-03-11 DIAGNOSIS — M549 Dorsalgia, unspecified: Secondary | ICD-10-CM

## 2021-03-11 DIAGNOSIS — M810 Age-related osteoporosis without current pathological fracture: Secondary | ICD-10-CM

## 2021-03-11 DIAGNOSIS — F32A Depression: Secondary | ICD-10-CM

## 2021-03-11 DIAGNOSIS — K219 Gastro-esophageal reflux disease without esophagitis: Secondary | ICD-10-CM

## 2021-03-11 DIAGNOSIS — M5136 Other intervertebral disc degeneration, lumbar region: Secondary | ICD-10-CM

## 2021-03-11 DIAGNOSIS — N3020 Other chronic cystitis without hematuria: Secondary | ICD-10-CM

## 2021-03-11 DIAGNOSIS — C801 Malignant (primary) neoplasm, unspecified: Secondary | ICD-10-CM

## 2021-03-11 DIAGNOSIS — E162 Hypoglycemia, unspecified: Secondary | ICD-10-CM

## 2021-03-11 DIAGNOSIS — G629 Polyneuropathy, unspecified: Secondary | ICD-10-CM

## 2021-03-11 DIAGNOSIS — M2550 Pain in unspecified joint: Secondary | ICD-10-CM

## 2021-03-11 DIAGNOSIS — M3500 Sicca syndrome, unspecified: Secondary | ICD-10-CM

## 2021-03-11 DIAGNOSIS — U071 COVID-19: Secondary | ICD-10-CM

## 2021-03-11 DIAGNOSIS — I38 Endocarditis, valve unspecified: Secondary | ICD-10-CM

## 2021-03-11 DIAGNOSIS — I10 Essential (primary) hypertension: Secondary | ICD-10-CM

## 2021-03-11 DIAGNOSIS — E785 Hyperlipidemia, unspecified: Secondary | ICD-10-CM

## 2021-03-11 DIAGNOSIS — C50212 Malignant neoplasm of upper-inner quadrant of left female breast: Secondary | ICD-10-CM

## 2021-03-11 DIAGNOSIS — Z9109 Other allergy status, other than to drugs and biological substances: Secondary | ICD-10-CM

## 2021-03-11 DIAGNOSIS — I8390 Asymptomatic varicose veins of unspecified lower extremity: Secondary | ICD-10-CM

## 2021-03-11 DIAGNOSIS — Z1379 Encounter for other screening for genetic and chromosomal anomalies: Secondary | ICD-10-CM

## 2021-03-11 DIAGNOSIS — Z853 Personal history of malignant neoplasm of breast: Secondary | ICD-10-CM

## 2021-03-11 NOTE — Assessment & Plan Note
The combination of metoprolol and flecainide seems to be controlling her arrhythmias effectively.  I reassured her about the isolated PAC symptoms that she is having and do not think we need to do anything different with medical therapy at this point.

## 2021-03-11 NOTE — Progress Notes
Date of Service: 03/11/2021    Kristen Norris is a 71 y.o. female.       HPI     Kristen Norris was in the [place] clinic today for follow-up regarding PSVT and left bundle branch block.  Her grandson, Kristen Norris, was with her.    She has some isolated palpitation symptoms that bother her maybe 2 or 3 times per week but nothing to suggest recurrent tachycardia.  She is not having any trouble with chest discomfort or breathlessness.  She denies any TIA or stroke symptoms.       Vitals:    03/11/21 1547   BP: 132/74   BP Source: Arm, Left Upper   Pulse: 67   SpO2: 100%   O2 Device: None (Room air)   PainSc: Zero   Weight: 61.2 kg (135 lb)   Height: 158.8 cm (5' 2.5)     Body mass index is 24.3 kg/m?Marland Kitchen     Past Medical History  Patient Active Problem List    Diagnosis Date Noted   ? 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 Other osteoporosis without current pathological fracture 10/19/2020   ? Soft tissue mass 08/18/2020   ? Rectocele, female 06/03/2020     06/03/20 - pt has 2-3+ rectocele, more prominent on standing with symptoms of urinary incontinence.     ? Frequent urinary incontinence 06/03/2020   ? 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  2-week cardiac monitor shows episodes of fast SVT 3-20 seconds in duration.     ? Overactive bladder 10/31/2018   ? 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 Rheumatoid factor positive 02/23/2018   ? Arthralgia of right temporomandibular joint 02/23/2018   ? S/P hysterectomy with oophorectomy 07/15/2011   ? Pelvic pain in female 07/15/2011   ? Cystocele 07/15/2011   ? Left bundle branch block 03/17/2011   ? Preoperative evaluation to rule out surgical contraindication 03/17/2011   ? Abnormal electro-oculogram 03/17/2011   ? Ovarian cyst, left 01/05/2011   ? 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 Thoracic or lumbosacral neuritis or radiculitis, unspecified 04/18/2010   ? S/P Lumpectomy of Breast-Surgical Oncology Notes 12/17/2007     DIAGNOSIS:  Left breast Invasive Ductal Cancer diagnosed 3/07,  STAGE II-A  T2 N0 M0  Markers:  ER 100%, PR 0%, Ki67 15%, HER-2/neu negative.     PROCEDURE: Left lumpectomy with sentinel node biopsy 06/07/2005    HPI: This 71 year old postmenopausal female developed pain in the medial side of the left chest wall in 2/07.  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 She underwent left lumpectomy and sentinel lymph node sampling on 06/07/05. Lumpectomy revealed a 2.1 x 2-cm invasive ductal carcinoma, grade II. No angiolymphatic invasion. There was perineural invasion. 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 Margins were clear. 0/2 sentinel lymph nodes were involved.   Oncotype DX was sent and came back with a high recurrence score of 33, which corresponds to a 10-year rate of distant recurrence of 23% with adjuvant tamoxifen alone. For adjuvant chemotherapy, she received four cycles of dose-dense AC and 10 weeks of taxol by Dr. Osborn Coho.  . Taxol had to be stopped at 10 weeks due to grade 3 peripheral neuropathy. She was placed on  adjuvant Arimidex in 10/07.  Radiation therapy was given by Dr. Joycelyn Man in [place], [place] and completed in December 2007.   She was last seen in 12/2007 with c/o left axillary burning.  On exam there was a stable scar retraction at left lumpectomy site in left UIQ. No palpable mass, no nipple or areolar change. Right breast: palpable fullness with tenderness right UIQ most consistent with prominent pectoral muscle (patient works heavy labor as gardener). Left diagnostic mammogram 11/09 was class 2. Right breast with palpable tender fullness right upper breast/chest wall, benign clinically. Right breast sonogram also negative 11/09.  Follow-up exam 03/2008 assessment:  Right breast pain- negative clinical exam as well as negative Ultrasound. Findings and complaint most consistent with musculoskeletal etiology (patient works in Aeronautical engineer).  She now returns for a 3 month f/u exam to confirm this impression.          ? GERD (gastroesophageal reflux disease)    ? Depression    ? Environmental allergies    ? PN (Peripheral Neuropathy)    ? Hypoglycemia    ? Osteoporosis      BMD  T-score (-2.3) spine and (-2.9) femur on Actonel     ? Vitamin D deficiency    ? Back pain 07/18/2007     The patient is currently experiencing pain.      Location: middle back   Intensity (1-10): 6   Description: dull   Duration: constant  Fatigue (0-10): 7     ? Pain in joint, ankle and foot 10/05/2006   ? Malignant neoplasm of other specified sites of female breast 06/15/2006     DIAGNOSIS:  Left breast cancer, 3/07.     STAGE:  II-A;  T2N0M0, ER positive, HER-2/neu negative.     PAST THERAPY:  This 71 year old postmenopausal female developed pain in the medial side of the left chest wall in 2/07.  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 She underwent left lumpectomy and sentinel lymph node sampling on 06/07/05. Lumpectomy revealed a 2.1 x 2-cm invasive ductal carcinoma, grade II. No angiolymphatic invasion. There was perineural invasion. 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 Margins were clear. 0/2 sentinel lymph nodes were involved. Oncotype DX was sent and came back with a high recurrence score of 33, which corresponds to a 10-year rate of distant recurrence of 23% with adjuvant tamoxifen alone. For adjuvant chemotherapy, she received four cycles of dose-dense AC and 10 weeks of taxol. Taxol had to be stopped at 10 weeks due to grade 3 peripheral neuropathy. She was placed on adjuvant Arimidex in 10/07.  Radiation therapy was given in [place], [place] and completed in December 2007.    PRESENT THERAPY:  Adjuvant Arimidex since 11/24/05.         ? Edema 02/28/2006         Review of Systems   Constitutional: Negative.   HENT: Negative.    Eyes: Negative.    Cardiovascular: Positive for palpitations.   Respiratory: Negative.    Endocrine: Negative.    Hematologic/Lymphatic: Negative.    Skin: Negative.    Musculoskeletal: Negative.    Gastrointestinal: Negative.    Genitourinary: Negative.    Neurological: Negative.    Psychiatric/Behavioral: Negative.    Allergic/Immunologic: Negative.        Physical Exam    Physical Exam   General Appearance: no distress   Skin:  warm, no ulcers or xanthomas   Digits and Nails: no cyanosis or clubbing   Eyes: conjunctivae and lids normal, pupils are equal and round   Teeth/Gums/Palate: dentition unremarkable, no lesions   Lips & Oral Mucosa: no pallor or cyanosis   Neck Veins: normal JVP , neck veins are not distended   Thyroid: no nodules, masses, tenderness or enlargement   Chest Inspection: chest is normal in appearance   Respiratory Effort: breathing comfortably, no respiratory distress   Auscultation/Percussion: lungs clear to auscultation, no rales or rhonchi, no wheezing   PMI: PMI not enlarged or displaced   Cardiac Rhythm: regular rhythm and normal rate   Cardiac Auscultation: S1, S2 normal, no rub, no gallop   Murmurs: no murmur   Peripheral Circulation: normal peripheral circulation   Carotid Arteries: normal carotid upstroke bilaterally, no bruits   Radial Arteries: normal symmetric radial pulses   Abdominal Aorta: no abdominal aortic bruit   Pedal Pulses: normal symmetric pedal pulses   Lower Extremity Edema: no lower extremity edema   Abdominal Exam: soft, non-tender, no masses, bowel sounds normal   Liver & Spleen: no organomegaly   Gait & Station: walks without assistance   Muscle Strength: normal muscle tone   Orientation: oriented to time, place and person   Affect & Mood: appropriate and sustained affect   Language and Memory: patient responsive and seems to comprehend information   Neurologic Exam: neurological assessment grossly intact   Other: moves all extremities        Cardiovascular Health Factors  Vitals BP Readings from Last 3 Encounters:   03/11/21 132/74   01/25/21 126/74   01/13/21 136/72     Wt Readings from Last 3 Encounters:   03/11/21 61.2 kg (135 lb)   03/10/21 61.2 kg (135 lb)   01/13/21 62.6 kg (138 lb)     BMI Readings from Last 3 Encounters:   03/11/21 24.30 kg/m?   03/10/21 24.30 kg/m?   01/13/21 25.24 kg/m?      Smoking Social History     Tobacco Use   Smoking Status Former   ? Packs/day: 1.00   ? Years: 14.00   ? Pack years: 14.00   ? Types: Cigarettes   ? Quit date: 04/07/2000   ? Years since quitting: 20.9   Smokeless Tobacco Never      Lipid Profile Cholesterol   Date Value Ref Range Status   12/24/2019 150  Final     HDL   Date Value Ref Range Status   12/24/2019 42  Final     LDL   Date Value Ref Range Status   12/24/2019 67  Final     Triglycerides   Date Value Ref Range Status   12/24/2019 207 (H) <150 Final      Blood Sugar No results found for: HGBA1C  Glucose   Date Value Ref Range Status   11/14/2020 96 70 - 100 MG/DL Final   09/81/1914 77 70 - 100 MG/DL Final   78/29/5621 308 (H) 70 - 100 MG/DL Final   65/78/4696 98 70 - 110 MG/DL Final   29/52/8413 244 70 - 110 MG/DL Final   02/09/7251 94 70 - 110 MG/DL Final     Glucose, POC   Date Value Ref Range Status   03/24/2011 117 (H) 70 - 100 MG/DL Final          Problems Addressed Today  Encounter Diagnoses   Name Primary?   ? PSVT (paroxysmal supraventricular tachycardia) (  HCC)    ? Left bundle branch block        Assessment and Plan       PSVT (paroxysmal supraventricular tachycardia) (HCC)  The combination of metoprolol and flecainide seems to be controlling her arrhythmias effectively.  I reassured her about the isolated PAC symptoms that she is having and do not think we need to do anything different with medical therapy at this point.    Left bundle branch block  Her conduction system disease does not appear to be associated with any structural cardiac abnormalities.  We did both echocardiography and stress testing in mid 2022, both of which were unremarkable.      Current Medications (including today's revisions)  ? atorvastatin (LIPITOR) 20 mg tablet Take 1 tablet by mouth once daily   ? benzonatate (TESSALON PERLES) 100 mg capsule Take 100 mg by mouth every 8 hours as needed for Cough.   ? CALCIUM CARBONATE (CALCIUM 500 PO) Take 1 Cap by mouth Daily.   ? Cholecalciferol (VITAMIN D3) 125 mcg (5,000 unit) capsule Take 10,000 Units by mouth daily.   ? duloxetine DR (CYMBALTA) 60 mg capsule Take one capsule by mouth daily.   ? flecainide (TAMBOCOR) 50 mg tablet Take 1 tablet by mouth twice daily   ? hydrOXYchloroQUINE (PLAQUENIL) 200 mg tablet Take 1.5 tablets by mouth daily. Take with food.   ? hyoscyamine sulfate (LEVSIN/SL) 0.125 mg sublingual tablet Place one tablet under tongue every 4 hours as needed. Indications: Bladder spasms   ? metoprolol XL (TOPROL XL) 50 mg extended release tablet Take 1 tablet by mouth once daily   ? oxybutynin XL (DITROPAN XL) 10 mg tablet TAKE 1 TABLET BY MOUTH ONCE DAILY DO  NOT  CUT  CRUSH  OR  CHEW   ? polyethylene glycol 3350 (MIRALAX) 17 gram/dose powder Take seventeen g by mouth daily.   ? senna/docusate (SENOKOT-S) 8.6/50 mg tablet Take one tablet by mouth daily. Indications: constipation   ? traMADoL (ULTRAM) 50 mg tablet Take one tablet by mouth every 4 hours as needed for Pain. Indications: neuropathic pain, pain     Total time spent on today's office visit was 30 minutes.  This includes face-to-face in person visit with patient as well as nonface-to-face time including review of the EMR, outside records, labs, radiologic studies, echocardiogram & other cardiovascular studies, formation of treatment plan, after visit summary, future disposition, and lastly on documentation.

## 2021-03-11 NOTE — Assessment & Plan Note
Her conduction system disease does not appear to be associated with any structural cardiac abnormalities.  We did both echocardiography and stress testing in mid 2022, both of which were unremarkable.

## 2021-03-17 ENCOUNTER — Encounter: Admit: 2021-03-17 | Discharge: 2021-03-17 | Payer: MEDICARE

## 2021-03-18 ENCOUNTER — Encounter: Admit: 2021-03-18 | Discharge: 2021-03-18 | Payer: MEDICARE

## 2021-03-27 ENCOUNTER — Encounter: Admit: 2021-03-27 | Discharge: 2021-03-27 | Payer: MEDICARE

## 2021-03-27 MED ORDER — FLECAINIDE 50 MG PO TAB
ORAL_TABLET | 0 refills
Start: 2021-03-27 — End: ?

## 2021-03-30 ENCOUNTER — Encounter: Admit: 2021-03-30 | Discharge: 2021-03-30 | Payer: MEDICARE
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
## 2021-04-01 ENCOUNTER — Encounter: Admit: 2021-04-01 | Discharge: 2021-04-01 | Payer: MEDICARE

## 2021-04-01 DIAGNOSIS — Z9221 Personal history of antineoplastic chemotherapy: Secondary | ICD-10-CM

## 2021-04-01 DIAGNOSIS — F32A Depression: Secondary | ICD-10-CM

## 2021-04-01 DIAGNOSIS — Z1231 Encounter for screening mammogram for malignant neoplasm of breast: Secondary | ICD-10-CM

## 2021-04-01 DIAGNOSIS — E162 Hypoglycemia, unspecified: Secondary | ICD-10-CM

## 2021-04-01 DIAGNOSIS — Z8041 Family history of malignant neoplasm of ovary: Secondary | ICD-10-CM

## 2021-04-01 DIAGNOSIS — M549 Dorsalgia, unspecified: Secondary | ICD-10-CM

## 2021-04-01 DIAGNOSIS — Z9229 Personal history of other drug therapy: Secondary | ICD-10-CM

## 2021-04-01 DIAGNOSIS — Z9189 Other specified personal risk factors, not elsewhere classified: Secondary | ICD-10-CM

## 2021-04-01 DIAGNOSIS — E559 Vitamin D deficiency, unspecified: Secondary | ICD-10-CM

## 2021-04-01 DIAGNOSIS — Z1211 Encounter for screening for malignant neoplasm of colon: Secondary | ICD-10-CM

## 2021-04-01 DIAGNOSIS — Z8781 Personal history of (healed) traumatic fracture: Secondary | ICD-10-CM

## 2021-04-01 DIAGNOSIS — E785 Hyperlipidemia, unspecified: Secondary | ICD-10-CM

## 2021-04-01 DIAGNOSIS — Z853 Personal history of malignant neoplasm of breast: Secondary | ICD-10-CM

## 2021-04-01 DIAGNOSIS — Z1273 Encounter for screening for malignant neoplasm of ovary: Secondary | ICD-10-CM

## 2021-04-01 DIAGNOSIS — M810 Age-related osteoporosis without current pathological fracture: Secondary | ICD-10-CM

## 2021-04-01 DIAGNOSIS — K219 Gastro-esophageal reflux disease without esophagitis: Secondary | ICD-10-CM

## 2021-04-01 DIAGNOSIS — I10 Essential (primary) hypertension: Secondary | ICD-10-CM

## 2021-04-01 DIAGNOSIS — U071 COVID-19: Secondary | ICD-10-CM

## 2021-04-01 DIAGNOSIS — N3020 Other chronic cystitis without hematuria: Secondary | ICD-10-CM

## 2021-04-01 DIAGNOSIS — G629 Polyneuropathy, unspecified: Secondary | ICD-10-CM

## 2021-04-01 DIAGNOSIS — M5136 Other intervertebral disc degeneration, lumbar region: Secondary | ICD-10-CM

## 2021-04-01 DIAGNOSIS — M1990 Unspecified osteoarthritis, unspecified site: Secondary | ICD-10-CM

## 2021-04-01 DIAGNOSIS — Z13820 Encounter for screening for osteoporosis: Secondary | ICD-10-CM

## 2021-04-01 DIAGNOSIS — C50212 Malignant neoplasm of upper-inner quadrant of left female breast: Secondary | ICD-10-CM

## 2021-04-01 DIAGNOSIS — N6310 Unspecified lump in the right breast, unspecified quadrant: Secondary | ICD-10-CM

## 2021-04-01 DIAGNOSIS — E782 Mixed hyperlipidemia: Secondary | ICD-10-CM

## 2021-04-01 DIAGNOSIS — Z79899 Other long term (current) drug therapy: Secondary | ICD-10-CM

## 2021-04-01 DIAGNOSIS — C801 Malignant (primary) neoplasm, unspecified: Secondary | ICD-10-CM

## 2021-04-01 DIAGNOSIS — I38 Endocarditis, valve unspecified: Secondary | ICD-10-CM

## 2021-04-01 DIAGNOSIS — I8390 Asymptomatic varicose veins of unspecified lower extremity: Secondary | ICD-10-CM

## 2021-04-01 DIAGNOSIS — M2550 Pain in unspecified joint: Secondary | ICD-10-CM

## 2021-04-01 DIAGNOSIS — Z9109 Other allergy status, other than to drugs and biological substances: Secondary | ICD-10-CM

## 2021-04-01 DIAGNOSIS — Z1379 Encounter for other screening for genetic and chromosomal anomalies: Secondary | ICD-10-CM

## 2021-04-01 DIAGNOSIS — M3500 Sicca syndrome, unspecified: Secondary | ICD-10-CM

## 2021-04-01 LAB — CBC AND DIFF
ABSOLUTE BASO COUNT: 0 K/UL (ref 0–0.20)
ABSOLUTE EOS COUNT: 0.2 K/UL (ref 0–0.45)
ABSOLUTE LYMPH COUNT: 1.8 K/UL (ref 1.0–4.8)
ABSOLUTE MONO COUNT: 0.7 K/UL (ref 0–0.80)
ABSOLUTE NEUTROPHIL: 2.7 K/UL (ref 1.8–7.0)
HEMOGLOBIN: 13 g/dL — ABNORMAL HIGH (ref 12.0–15.0)
LYMPHOCYTES %: 33 % (ref 24–44)
MONOCYTES %: 14 % — ABNORMAL HIGH (ref 4–12)
MPV: 9.2 FL (ref 7–11)
NEUTROPHILS %: 49 % (ref 41–77)
PLATELET COUNT: 246 K/UL (ref 150–400)
WBC COUNT: 5.4 K/UL (ref 4.5–11.0)

## 2021-04-01 LAB — CA125: CA-125: 26 U/mL (ref <35)

## 2021-04-01 LAB — 25-OH VITAMIN D (D2 + D3): VITAMIN D (25-OH) TOTAL: 52 ng/mL (ref 30–80)

## 2021-04-01 LAB — COMPREHENSIVE METABOLIC PANEL: SODIUM: 140 MMOL/L (ref 137–147)

## 2021-04-01 NOTE — Progress Notes
Normal diagnostic mammogram and ultrasound results reviewed at clinic visit. Released to MyChart and routed to referring physician as requested.

## 2021-04-01 NOTE — Progress Notes
I reviewed the case with the Psychology Intern, and agree with the conclusions and treatment plan.       Manna Gose, PhD  Licensed Psychologist   Onco-Psychology Program
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
## 2021-05-03 ENCOUNTER — Ambulatory Visit: Admit: 2021-05-03 | Discharge: 2021-05-04 | Payer: MEDICARE

## 2021-05-03 ENCOUNTER — Encounter: Admit: 2021-05-03 | Discharge: 2021-05-03 | Payer: MEDICARE

## 2021-05-03 VITALS — BP 128/73 | HR 57 | Temp 97.90000°F | Resp 16 | Ht 62.5 in | Wt 136.4 lb

## 2021-05-03 DIAGNOSIS — F32A Depression: Secondary | ICD-10-CM

## 2021-05-03 DIAGNOSIS — Z853 Personal history of malignant neoplasm of breast: Secondary | ICD-10-CM

## 2021-05-03 DIAGNOSIS — M3500 Sicca syndrome, unspecified: Secondary | ICD-10-CM

## 2021-05-03 DIAGNOSIS — E559 Vitamin D deficiency, unspecified: Secondary | ICD-10-CM

## 2021-05-03 DIAGNOSIS — E162 Hypoglycemia, unspecified: Secondary | ICD-10-CM

## 2021-05-03 DIAGNOSIS — M2550 Pain in unspecified joint: Secondary | ICD-10-CM

## 2021-05-03 DIAGNOSIS — M5136 Other intervertebral disc degeneration, lumbar region: Secondary | ICD-10-CM

## 2021-05-03 DIAGNOSIS — U071 COVID-19: Secondary | ICD-10-CM

## 2021-05-03 DIAGNOSIS — E782 Mixed hyperlipidemia: Secondary | ICD-10-CM

## 2021-05-03 DIAGNOSIS — M1990 Unspecified osteoarthritis, unspecified site: Secondary | ICD-10-CM

## 2021-05-03 DIAGNOSIS — N3020 Other chronic cystitis without hematuria: Secondary | ICD-10-CM

## 2021-05-03 DIAGNOSIS — I10 Essential (primary) hypertension: Secondary | ICD-10-CM

## 2021-05-03 DIAGNOSIS — M549 Dorsalgia, unspecified: Secondary | ICD-10-CM

## 2021-05-03 DIAGNOSIS — K219 Gastro-esophageal reflux disease without esophagitis: Secondary | ICD-10-CM

## 2021-05-03 DIAGNOSIS — Z9109 Other allergy status, other than to drugs and biological substances: Secondary | ICD-10-CM

## 2021-05-03 DIAGNOSIS — I8390 Asymptomatic varicose veins of unspecified lower extremity: Secondary | ICD-10-CM

## 2021-05-03 DIAGNOSIS — E785 Hyperlipidemia, unspecified: Secondary | ICD-10-CM

## 2021-05-03 DIAGNOSIS — G629 Polyneuropathy, unspecified: Secondary | ICD-10-CM

## 2021-05-03 DIAGNOSIS — C801 Malignant (primary) neoplasm, unspecified: Secondary | ICD-10-CM

## 2021-05-03 DIAGNOSIS — Z1379 Encounter for other screening for genetic and chromosomal anomalies: Secondary | ICD-10-CM

## 2021-05-03 DIAGNOSIS — C50212 Malignant neoplasm of upper-inner quadrant of left female breast: Secondary | ICD-10-CM

## 2021-05-03 DIAGNOSIS — I38 Endocarditis, valve unspecified: Secondary | ICD-10-CM

## 2021-05-03 DIAGNOSIS — M810 Age-related osteoporosis without current pathological fracture: Secondary | ICD-10-CM

## 2021-05-03 LAB — CBC AND DIFF
ABSOLUTE BASO COUNT: 0 K/UL (ref 0–0.20)
ABSOLUTE EOS COUNT: 0 K/UL (ref 0–0.45)
ABSOLUTE LYMPH COUNT: 1.6 K/UL (ref 1.0–4.8)
ABSOLUTE MONO COUNT: 0.4 K/UL (ref 0–0.80)
ABSOLUTE NEUTROPHIL: 3.3 K/UL (ref 1.8–7.0)
BASOPHILS %: 1 % (ref 0–2)
EOSINOPHILS %: 2 % (ref >60)
HEMATOCRIT: 44 % (ref 36–45)
LYMPHOCYTES %: 30 % (ref 24–44)
MCH: 28 pg (ref 26–34)
MCHC: 32 g/dL (ref 32.0–36.0)
MONOCYTES %: 8 % (ref 4–12)
MPV: 8.7 FL (ref 7–11)
NEUTROPHILS %: 59 % (ref 41–77)
PLATELET COUNT: 224 K/UL (ref 150–400)
RBC COUNT: 5 M/UL — ABNORMAL HIGH (ref 4.0–5.0)
RDW: 14 % (ref 11–15)
WBC COUNT: 5.5 K/UL (ref 4.5–11.0)

## 2021-05-03 LAB — URINALYSIS DIPSTICK REFLEX TO CULTURE
GLUCOSE,UA: NEGATIVE
LEUKOCYTES: NEGATIVE
NITRITE: NEGATIVE
PROTEIN,UA: NEGATIVE
URINE ASCORBIC ACID, UA: NEGATIVE
URINE BILE: NEGATIVE
URINE BLOOD: NEGATIVE
URINE PH: 5 (ref 5.0–8.0)
URINE SPEC GRAVITY: 1 (ref 1.005–1.030)

## 2021-05-03 LAB — URINALYSIS MICROSCOPIC REFLEX TO CULTURE

## 2021-05-03 LAB — PROTEIN/CR RATIO,UR RAN
UR CREATININE, RAN: 86 mg/dL — ABNORMAL LOW (ref >60)
UR TOTAL PROTEIN,RAN: 11 mg/dL (ref 8.5–10.6)

## 2021-05-03 LAB — KAPPA/LAMBDA FREE LIGHT CHAINS
KAPPA FLC: 2.6 mg/dL — ABNORMAL HIGH (ref 0.33–1.94)
KAPPA/LAMBDA FLC: 1.2 (ref 0.26–1.65)
LAMBDA FLC: 2.1 mg/dL (ref 0.57–2.63)

## 2021-05-03 LAB — IMMUNOGLOBULINS-IGA,IGG,IGM
IGA: 104 mg/dL (ref 70–390)
IGG: 644 mg/dL — ABNORMAL LOW (ref 762–1488)

## 2021-05-03 LAB — SED RATE: ESR: 45 mm/h — ABNORMAL HIGH (ref 0–30)

## 2021-05-03 LAB — C3 COMPLEMENT 3: C3: 172 mg/dL (ref 88–200)

## 2021-05-03 LAB — COMPREHENSIVE METABOLIC PANEL
POTASSIUM: 4.2 MMOL/L (ref 3.5–5.1)
SODIUM: 139 MMOL/L (ref 137–147)

## 2021-05-03 LAB — BETA 2 MICROGLOBULIN: B2M: 2.4 mg/L — ABNORMAL HIGH (ref 0.8–2.3)

## 2021-05-03 LAB — C4 COMPLEMENT 4: C4: 55 mg/dL — ABNORMAL HIGH (ref 10–49)

## 2021-05-03 LAB — C REACTIVE PROTEIN (CRP): C-REACTIVE PROTEIN: 0.6 mg/dL (ref <1.0)

## 2021-05-03 MED ORDER — CEVIMELINE 30 MG PO CAP
ORAL_CAPSULE | ORAL | 1 refills | Status: AC
Start: 2021-05-03 — End: ?

## 2021-05-03 NOTE — Patient Instructions
Please call the infusion center and in the summer 2 schedule your next infusion in September 2023  I will switch her from pilocarpine to Evoxac to manage her dry mouth once daily for the first week with meals then 2 tablets daily  Let me know if you experience any side effects  We will do Sjogren's specific labs today before you leave and plan to see you back in about 6 months

## 2021-05-03 NOTE — Progress Notes
Kristen Norris  is a 71 year old- Caucasian female here for a follow-up visit    Chief complaint: Management of seronegative Sjogren's disease      Interval history:  Last seen in October at which time we started her on pilocarpine to manage dry mouth symptoms, unfortunately this led to diarrhea she stopped therapy after 3 days reporting some improvement of her dry mouth while on therapy  She remains on hydroxychloroquine 300 mg daily and her last eye exam was performed in the fall 2022  Last Reclast infusion was in September 2022, she received 2 doses total  She reports a lot of back pain, recently she was deemed not to be a surgical candidate and is seeing a new spine specialist soon, she reports that she might require surgical intervention soon given her ongoing complaints  No inflammatory arthritis or rashes, no parotitis    Current medication list:  Reviewed    Examination  BP 128/73 (BP Source: Arm, Right Upper, Patient Position: Sitting)  - Pulse 57  - Temp 36.6 ?C (97.9 ?F) (Temporal)  - Resp 16  - Ht 158.8 cm (5' 2.5)  - Wt 61.9 kg (136 lb 6.4 oz)  - LMP  (LMP Unknown)  - SpO2 100%  - BMI 24.55 kg/m?    Very pleasant today and in no acute distress    No evidence of parotitis    On examination with subtle Heberden's nodes bilaterally but no active synovitis    Schirmer's test  OS 3 mm  OD 4 mm     Unstimulated salivary flow rate= 0.16 ml/min                                   Assessment and plan:    1.  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, myalgia and arthralgia, objective evidence of dry eyes and dry mouth, positive ANA and rheumatoid factor and a positive lip biopsy with focus score of 2    She started hydroxychloroquine in August 2020, currently using weight-based dose of 300 mg daily and she has done well on therapy  We have started her last visit on pilocarpine which led to significant diarrhea therapy was discontinued, it was noted that that she experienced improvement of her dry mouth when she was on treatment  Therefore, I will switch her to Evoxac 30 mg up to twice daily for now, she will let me know if she experience any side effects    I will plan to repeat her Sjogren's activity labs and see her back in approximately 6 months    2. Osteoporosis: she was on prolia but could not afford it after 2 doses, Fosamax was started in January 2021 but stopped due to epigastric pain, last dose Sep 2022 (second dose)   She will proceed with her next dose in September 2023    3. Generalized osteoarthritis     4. 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 History of Breast cancer: Diagnosed in 2007, in remission since       Plan:  Start Evoxac twice daily  Proceed with reflux in September  Sjogren's activity labs  Continue hydroxychloroquine 300 mg daily     I will see her back in about 6 months    Chesley Mires  Cecelia Byars, MD    Because this dictation was prepared with voice recognition software, there remains a potential for typographical errors or incorrect word choices by the system. We apologize for any inadvertent inconvenience from such an error.

## 2021-05-04 ENCOUNTER — Encounter: Admit: 2021-05-04 | Discharge: 2021-05-04 | Payer: MEDICARE

## 2021-05-08 ENCOUNTER — Encounter: Admit: 2021-05-08 | Discharge: 2021-05-08 | Payer: MEDICARE

## 2021-05-08 ENCOUNTER — Ambulatory Visit: Admit: 2021-05-08 | Discharge: 2021-05-08 | Payer: MEDICARE

## 2021-05-08 DIAGNOSIS — G629 Polyneuropathy, unspecified: Secondary | ICD-10-CM

## 2021-05-08 DIAGNOSIS — I10 Essential (primary) hypertension: Secondary | ICD-10-CM

## 2021-05-08 DIAGNOSIS — M1990 Unspecified osteoarthritis, unspecified site: Secondary | ICD-10-CM

## 2021-05-08 DIAGNOSIS — E559 Vitamin D deficiency, unspecified: Secondary | ICD-10-CM

## 2021-05-08 DIAGNOSIS — Z853 Personal history of malignant neoplasm of breast: Secondary | ICD-10-CM

## 2021-05-08 DIAGNOSIS — M810 Age-related osteoporosis without current pathological fracture: Secondary | ICD-10-CM

## 2021-05-08 DIAGNOSIS — C50212 Malignant neoplasm of upper-inner quadrant of left female breast: Secondary | ICD-10-CM

## 2021-05-08 DIAGNOSIS — M5136 Other intervertebral disc degeneration, lumbar region: Secondary | ICD-10-CM

## 2021-05-08 DIAGNOSIS — I38 Endocarditis, valve unspecified: Secondary | ICD-10-CM

## 2021-05-08 DIAGNOSIS — R200 Anesthesia of skin: Secondary | ICD-10-CM

## 2021-05-08 DIAGNOSIS — Z1379 Encounter for other screening for genetic and chromosomal anomalies: Secondary | ICD-10-CM

## 2021-05-08 DIAGNOSIS — Z9109 Other allergy status, other than to drugs and biological substances: Secondary | ICD-10-CM

## 2021-05-08 DIAGNOSIS — K219 Gastro-esophageal reflux disease without esophagitis: Secondary | ICD-10-CM

## 2021-05-08 DIAGNOSIS — E162 Hypoglycemia, unspecified: Secondary | ICD-10-CM

## 2021-05-08 DIAGNOSIS — C801 Malignant (primary) neoplasm, unspecified: Secondary | ICD-10-CM

## 2021-05-08 DIAGNOSIS — M5442 Lumbago with sciatica, left side: Secondary | ICD-10-CM

## 2021-05-08 DIAGNOSIS — I8390 Asymptomatic varicose veins of unspecified lower extremity: Secondary | ICD-10-CM

## 2021-05-08 DIAGNOSIS — E785 Hyperlipidemia, unspecified: Secondary | ICD-10-CM

## 2021-05-08 DIAGNOSIS — N3020 Other chronic cystitis without hematuria: Secondary | ICD-10-CM

## 2021-05-08 DIAGNOSIS — F32A Depression: Secondary | ICD-10-CM

## 2021-05-08 DIAGNOSIS — M549 Dorsalgia, unspecified: Secondary | ICD-10-CM

## 2021-05-08 DIAGNOSIS — E782 Mixed hyperlipidemia: Secondary | ICD-10-CM

## 2021-05-08 DIAGNOSIS — M2550 Pain in unspecified joint: Secondary | ICD-10-CM

## 2021-05-08 DIAGNOSIS — M3500 Sicca syndrome, unspecified: Secondary | ICD-10-CM

## 2021-05-08 DIAGNOSIS — U071 COVID-19: Secondary | ICD-10-CM

## 2021-05-08 LAB — VITAMIN B12: VITAMIN B12: 368 pg/mL (ref 180–914)

## 2021-05-08 NOTE — Patient Instructions
Vena Austria 71 y.o. 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    1. Bilateral numbness and tingling of arms and legs    2. Chronic left-sided low back pain with left-sided sciatica      RECOMMENDATIONS  - EMG/NCS for neuropathic and lumbar radiculopathy screening (left) and mononeuropathy screening (right upper extremity)  - Ordered neuropathic labs as listed below for further evaluation.   - Continue cymbalta which has been helping neuropathic pain.       Orders Placed This Encounter    EMG PROCEDURE    VITAMIN B12    METHYLMALONIC ACID QUANT    COPPER    IMMUNOFIXATION, SERUM (IFES)       FOLLOWUP PLAN  Return in about 6 months (around 11/07/2021).  The patient is instructed to contact me if there are any concerns with the agreed plan.      -- Preferred method of communication is through [employer], if the issue cannot wait until your next scheduled follow up.   -- MyChart may be used for non-emergent communication. Emails are not reviewed after hours or over the weekend/holidays/after 4PM. Staff will reply to your email within 24-48 business hours.       -- If you do not hear from [place] within one week of a lab or imaging study being completed, please call/send my chart email to the office to be sure that we have received the results. This is especially challenging when tests are done outside of the [facility] system, as many times results do not make it back to our office for a variety of reasons. In our office no news is good news does not apply. You should hear from [place] with results for each test.    -- If you are having acute (new/sudden onset) or severe/worsening neurologic symptoms, please call 911 or seek care in ED.    -- For scheduling of IMAGING/RADIOLOGY, please call [phone] at your convenience to schedule your studies.  -- For referrals placed during the visit, if you have not heard from scheduling within one week, please call the call center at [phone] to get scheduling assistance.  -- For refills on medications, please first contact your pharmacy, who will fax a refill authorization request form to our office.  Weekdays only. Allow up to 2 business days for refills. Please plan ahead, as refills will not be filled after hours.  -- Our front desk staff, Selena Batten or Marcelino Duster may be reached at [phone] for scheduling needs.   -- Heather RN, may be contacted at [phone] for urgent needs. Staff will return your call within 24 business hours.     For Appointments:   -- Please try to arrive early for your appointment time to help facilitate your visit. 15 minutes early is recommended.   -- If you are late to your appointment, we reserve the right to ask you to reschedule or wait until next available time to be seen in fairness to other patients scheduled that day.   -- There are times when we are running behind in clinic. Our goal is to always be on time, however, there are time when unexpected events occur with patients, which may cause a delay. We appreciate your understanding when this occurs.

## 2021-05-08 NOTE — Progress Notes
Date of Service: 05/08/2021    Referral Provider: Marton Redwood, NP    Subjective:             Kristen Norris is a 71 y.o. female with numbness in the legs.     History of Present Illness  Kristen Norris 71 y.o. 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    She reports that last fall her legs gave out and couldn't feel them for about 5 minutes. Someone helped her and the symptoms resolve but that day the same symptoms occurred where he legs went to sleep and lasted for about a minutes but these episodes has not reoccurred.     She has been using caine since the incident (fall 2022) to help her with balance and stability. She usually lose balance and occasionally falls. She has been working with physical therapy for balance training for about 3 months. She has noticed that since physical therapy the weakness has improved. She has had work up with MRI lumbar that showed degenerative changes. She follows with spine center and has received multiple injection in the lumbar spine. She does reports shooting pain down the left leg. She feels like the left leg is weaker as well.     The weakness and numbness is triggered by weather change and going up the stairs.     Review of Records:       Medical History:   Diagnosis Date   ? Arthritis    ? Back pain 07/18/2007   ? Back pain    ? Bladder infection, chronic 2017   ? Chest pain    ? COVID-19 12/28/2018   ? Degenerative disc disease, lumbar     Lo   ? Depression    ? Elevated triglycerides with high cholesterol    ? Environmental allergies    ? Essential hypertension     When in traffic   ? 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 Hereditary Cancer Update Test - [place])   ? GERD (gastroesophageal reflux disease) ? H/O Sjogren's disease (HCC)    ? Heart valve disease 2020   ? Hyperlipemia    ? Hypoglycemia    ? Joint pain    ? 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 Neuropathy    ? Osteoporosis 03/2007    BMD  T-score (-2.3) spine and (-2.9) femur on Actonel   ? Other malignant neoplasm without specification of site    ? Personal history of malignant neoplasm of breast    ? PN (Peripheral Neuropathy)    ? Sjogren's disease (HCC)    ? Varicose veins 2019   ? Vitamin D deficiency 07/2007         Surgical History:   Procedure Laterality Date   ? CARDIAC CATHERIZATION  03/28/2005    LAD 20%, Circ ostial 30%, EF 65%   ? HX BREAST LUMPECTOMY  06/07/2005    Left with SLNBX (0/2)   ? COLONOSCOPY N/A 03/01/2010    normal ([name], [facility])   ? HX HYSTERECTOMY  03/24/2011    [name], MD TL[facility]/BSO   ? Excision Posterior Neck mass N/A 09/04/2020    Performed by Elisabeth Most  Arma Heading., MD at [facility] OR   ? anterior colporrhaphy (cystocele repair);  Posterior colporrhaphy (rectocele repair); N/A 11/13/2020    Performed by [name], MD at [facility] OR   ? CYSTOURETHROSCOPY N/A 11/13/2020    Performed by [name], MD at [facility] OR   ? HX BLADDER SUSPENSION     ? HX TONSILLECTOMY  1979   ? HX TUBAL LIGATION     ? KNEE SURGERY  ]999   ? PR LAPAROSCOPY SURG RPR INITIAL INGUINAL HERNIA  1982         Social History     Socioeconomic History   ? Marital status: Divorced   ? Number of children: 2   ? Highest education level: GED or equivalent   Occupational History   ? Occupation: grounds Human resources officer: [name]   Tobacco Use   ? Smoking status: Former     Packs/day: 1.00     Years: 14.00     Pack years: 14.00     Types: Cigarettes     Quit date: 04/07/2000     Years since quitting: 21.0   ? Smokeless tobacco: Never   Vaping Use   ? Vaping Use: Never used   Substance and Sexual Activity   ? Alcohol use: Not Currently   ? Drug use: No   ? Sexual activity: Never         Family History   Problem Relation Age of Onset   ? Heart Disease Mother    ? Hypoglycemia Mother    ? Heart Disease Father         farmer   ? Coronary Artery Disease Father    ? Arthritis Father    ? Heart Attack Brother 45   ? Other Brother 22        shot himself 06/30/2019   ? Complete Hysterectomy Sister         rotator cuff surgery   ? Other Sister 98        COVID complications   ? Diabetes Type II Maternal Grandmother    ? Cancer-Hematologic Daughter 22   ? COPD Daughter         on oxygen   ? Tobacco disorder Daughter         on Chantix   ? Complete Hysterectomy Daughter 70   ? None Reported Grandchild    ? Other Grandchild         wrestler-stomach problems   ? None Reported Grandchild    ? None Reported Grandchild    ? None Reported Grandchild    ? None Reported Grandchild    ? None Reported Sister    ? Heart murmur Sister    ? Heart Disease Sister    ? Osteopenia Sister    ? Depression Brother 56        hung himself 05/2017   ? Other Brother 0        died in utero-umbilical cord wrapped around him   ? Cancer-Breast Paternal Aunt 55        bilateral mastectomy   ? Cancer-Breast Other 60        paternal 1st cousin   ? Cancer-Lung Other 22        paternal 1st cousin   ? Cancer-Breast Other 40        paternal 1st cousin   ? Cancer-Ovarian Other 25   ? None Reported Other    ?  Cancer-Breast Other 18        paternal 1st cousin   ? Brain Tumor Other 52        nephew   ? Cancer-Lung Paternal Uncle 45   ? Cervical Cancer Neg Hx    ? Cancer-Colon Neg Hx    ? Cancer-Uterine Neg Hx    ? Bleeding Disorders Neg Hx    ? Diabetes Neg Hx        Allergies   Allergen Reactions   ? Sulfa (Sulfonamide Antibiotics) RASH and NAUSEA AND VOMITING     Allergy recorded in SMS: Sulfa            Review of Systems      Objective:         ? atorvastatin (LIPITOR) 20 mg tablet Take 1 tablet by mouth once daily   ? benzonatate (TESSALON PERLES) 100 mg capsule Take one capsule by mouth every 8 hours as needed for Cough.   ? CALCIUM CARBONATE (CALCIUM 500 PO) Take 1 Cap by mouth Daily.   ? cevimeline (EVOXAC) 30 mg capsule One tab daily for one week then one tab twice daily with meals   ? Cholecalciferol (VITAMIN D3) 125 mcg (5,000 unit) capsule Take two capsules by mouth daily.   ? duloxetine DR (CYMBALTA) 60 mg capsule Take one capsule by mouth daily.   ? flecainide (TAMBOCOR) 50 mg tablet Take 1 tablet by mouth twice daily   ? hydrOXYchloroQUINE (PLAQUENIL) 200 mg tablet Take 1.5 tablets by mouth daily. Take with food.   ? hyoscyamine sulfate (LEVSIN/SL) 0.125 mg sublingual tablet Place one tablet under tongue every 4 hours as needed. Indications: Bladder spasms   ? metoprolol XL (TOPROL XL) 50 mg extended release tablet Take 1 tablet by mouth once daily   ? oxybutynin XL (DITROPAN XL) 10 mg tablet TAKE 1 TABLET BY MOUTH ONCE DAILY DO  NOT  CUT  CRUSH  OR  CHEW   ? polyethylene glycol 3350 (MIRALAX) 17 gram/dose powder Take seventeen g by mouth daily.   ? traMADoL (ULTRAM) 50 mg tablet Take one tablet by mouth every 4 hours as needed for Pain. Indications: neuropathic pain, pain     Vitals:    05/08/21 0858   BP: (!) 148/78   BP Source: Arm, Right Upper   Pulse: 55   PainSc: Five   Weight: 61.3 kg (135 lb 3.2 oz)   Height: 157.5 cm (5' 2)     Body mass index is 24.73 kg/m?Marland Kitchen       Physical Exam  General: no acute distress, cooperative    HEENT: normocephalic, atraumatic, fundi benign   CV: well perfused  Pulm: equal chest rise, non labored breathing  Ext: no swelling, cyanosis and clubbing     Neurological Exam   Mental status: Alert and oriented to time, place, person and situation.  Speech: Fluent, with normal naming, comprehension, articulation and repetition  CN II-XII: Visual fields intact to confrontation, PERRL (4->2), EOMI, facial sensation intact. Symmetrical facial movement. Hearing grossly intact. Strong cough, elevates palate, uvula midline. Strong shoulder shrug. Tongue midline.  Motor: (R/L) b/l UE/LE 5/5 in proximal and distal muscle groups. Normal tone and bulk. No abnormal movement, fasciculation or pronator drift.  Sensory: Intact light touch. Pin prick reduced below high shin and patchy in the lower shin region and decreased below wrist.     Vibration of great toe is 3 seconds  Reflexes: (R/L) 1/1Bj, 1/1Trj, 1/1Brj, 1/1Knj, 0/0Aj.   Coordination/ fine  movement: Normal finger to nose and heel to shin.  Gait: Normal stance and steady gait. Romberg sign absent.       Assessment and Plan:  MARSI BOBINSKI 71 y.o. 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    1. Bilateral numbness and tingling of arms and legs    2. Chronic left-sided low back pain with left-sided sciatica      RECOMMENDATIONS  - EMG/NCS for neuropathic and lumbar radiculopathy screening (left) and mononeuropathy screening (right upper extremity)  - Ordered neuropathic labs as listed below for further evaluation.   - Continue cymbalta which has been helping neuropathic pain.       Orders Placed This Encounter   ? EMG PROCEDURE   ? VITAMIN B12   ? METHYLMALONIC ACID QUANT   ? COPPER   ? IMMUNOFIXATION, SERUM (IFES)       FOLLOWUP PLAN  Return in about 6 months (around 11/07/2021).  The patient is instructed to contact me if there are any concerns with the agreed plan.  Problem   Chronic Left-Sided Low Back Pain With Left-Sided Sciatica        Total time 60 minutes.  Estimated counseling time was more than 50% of the visit time. Counseled patient regarding numbness and tingling of extremities .     Linden Dolin, MD  Clinical Assistant Professor   [facility]

## 2021-05-09 ENCOUNTER — Ambulatory Visit: Admit: 2021-05-08 | Discharge: 2021-05-09 | Payer: MEDICARE

## 2021-05-09 DIAGNOSIS — R202 Paresthesia of skin: Secondary | ICD-10-CM

## 2021-05-27 ENCOUNTER — Encounter: Admit: 2021-05-27 | Discharge: 2021-05-27 | Payer: MEDICARE

## 2021-05-31 ENCOUNTER — Encounter: Admit: 2021-05-31 | Discharge: 2021-05-31 | Payer: MEDICARE

## 2021-06-01 ENCOUNTER — Encounter: Admit: 2021-06-01 | Discharge: 2021-06-01 | Payer: MEDICARE

## 2021-06-01 ENCOUNTER — Ambulatory Visit: Admit: 2021-06-01 | Discharge: 2021-06-01 | Payer: MEDICARE

## 2021-06-01 DIAGNOSIS — E162 Hypoglycemia, unspecified: Secondary | ICD-10-CM

## 2021-06-01 DIAGNOSIS — I10 Essential (primary) hypertension: Secondary | ICD-10-CM

## 2021-06-01 DIAGNOSIS — C801 Malignant (primary) neoplasm, unspecified: Secondary | ICD-10-CM

## 2021-06-01 DIAGNOSIS — C50212 Malignant neoplasm of upper-inner quadrant of left female breast: Secondary | ICD-10-CM

## 2021-06-01 DIAGNOSIS — Z1379 Encounter for other screening for genetic and chromosomal anomalies: Secondary | ICD-10-CM

## 2021-06-01 DIAGNOSIS — M549 Dorsalgia, unspecified: Secondary | ICD-10-CM

## 2021-06-01 DIAGNOSIS — M1990 Unspecified osteoarthritis, unspecified site: Secondary | ICD-10-CM

## 2021-06-01 DIAGNOSIS — N3020 Other chronic cystitis without hematuria: Secondary | ICD-10-CM

## 2021-06-01 DIAGNOSIS — I38 Endocarditis, valve unspecified: Secondary | ICD-10-CM

## 2021-06-01 DIAGNOSIS — Z853 Personal history of malignant neoplasm of breast: Secondary | ICD-10-CM

## 2021-06-01 DIAGNOSIS — I8390 Asymptomatic varicose veins of unspecified lower extremity: Secondary | ICD-10-CM

## 2021-06-01 DIAGNOSIS — M810 Age-related osteoporosis without current pathological fracture: Secondary | ICD-10-CM

## 2021-06-01 DIAGNOSIS — U071 COVID-19: Secondary | ICD-10-CM

## 2021-06-01 DIAGNOSIS — M2550 Pain in unspecified joint: Secondary | ICD-10-CM

## 2021-06-01 DIAGNOSIS — M461 Sacroiliitis, not elsewhere classified: Secondary | ICD-10-CM

## 2021-06-01 DIAGNOSIS — E785 Hyperlipidemia, unspecified: Secondary | ICD-10-CM

## 2021-06-01 DIAGNOSIS — M5136 Other intervertebral disc degeneration, lumbar region: Secondary | ICD-10-CM

## 2021-06-01 DIAGNOSIS — K219 Gastro-esophageal reflux disease without esophagitis: Secondary | ICD-10-CM

## 2021-06-01 DIAGNOSIS — G629 Polyneuropathy, unspecified: Secondary | ICD-10-CM

## 2021-06-01 DIAGNOSIS — Z9109 Other allergy status, other than to drugs and biological substances: Secondary | ICD-10-CM

## 2021-06-01 DIAGNOSIS — E782 Mixed hyperlipidemia: Secondary | ICD-10-CM

## 2021-06-01 DIAGNOSIS — F32A Depression: Secondary | ICD-10-CM

## 2021-06-01 DIAGNOSIS — M3500 Sicca syndrome, unspecified: Secondary | ICD-10-CM

## 2021-06-01 DIAGNOSIS — E559 Vitamin D deficiency, unspecified: Secondary | ICD-10-CM

## 2021-06-01 MED ORDER — BUPIVACAINE (PF) 0.5 % (5 MG/ML) IJ SOLN
3 mL | Freq: Once | INTRAMUSCULAR | 0 refills | Status: CP
Start: 2021-06-01 — End: ?
  Administered 2021-06-01: 16:00:00 3 mL via INTRAMUSCULAR

## 2021-06-01 MED ORDER — IOHEXOL 240 MG IODINE/ML IV SOLN
1 mL | Freq: Once | EPIDURAL | 0 refills | Status: CP
Start: 2021-06-01 — End: ?
  Administered 2021-06-01: 16:00:00 1 mL via EPIDURAL

## 2021-06-01 NOTE — Progress Notes
SPINE CENTER  INTERVENTIONAL PAIN PROCEDURE HISTORY AND PHYSICAL    Chief Complaint: Pain    HISTORY OF PRESENT ILLNESS:    Left sided back pain    Medical History:   Diagnosis Date   ? Arthritis    ? Back pain 07/18/2007   ? Back pain    ? Bladder infection, chronic 2017   ? Chest pain    ? COVID-19 12/28/2018   ? Degenerative disc disease, lumbar     Lo   ? Depression    ? Elevated triglycerides with high cholesterol    ? Environmental allergies    ? Essential hypertension     When in traffic   ? 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 Hereditary Cancer Update Test - [place])   ? GERD (gastroesophageal reflux disease)    ? H/O Sjogren's disease (HCC)    ? Heart valve disease 2020   ? Hyperlipemia    ? Hypoglycemia    ? Joint pain    ? 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 Neuropathy    ? Osteoporosis 03/2007    BMD  T-score (-2.3) spine and (-2.9) femur on Actonel   ? Other malignant neoplasm without specification of site    ? Personal history of malignant neoplasm of breast    ? PN (Peripheral Neuropathy)    ? Sjogren's disease (HCC)    ? Varicose veins 2019   ? Vitamin D deficiency 07/2007       Surgical History:   Procedure Laterality Date   ? CARDIAC CATHERIZATION  03/28/2005    LAD 20%, Circ ostial 30%, EF 65%   ? HX BREAST LUMPECTOMY  06/07/2005    Left with SLNBX (0/2)   ? COLONOSCOPY N/A 03/01/2010    normal ([name], [facility])   ? HX HYSTERECTOMY  03/24/2011    [name], MD TLH/BSO   ? Excision Posterior Neck mass N/A 09/04/2020    Performed by Freund, [name]., MD at [facility] OR   ? anterior colporrhaphy (cystocele repair);  Posterior colporrhaphy (rectocele repair); N/A 11/13/2020    Performed by [name], MD at [facility] OR   ? CYSTOURETHROSCOPY N/A 11/13/2020    Performed by [name], MD at [facility] OR   ? HX BLADDER SUSPENSION     ? HX TONSILLECTOMY  1979   ? HX TUBAL LIGATION     ? KNEE SURGERY  ]999   ? PR LAPAROSCOPY SURG RPR INITIAL INGUINAL HERNIA  1982       family history includes Arthritis in her father; Brain Tumor (age of onset: 49) in an other family member; COPD in her daughter; Essie Christine (age of onset: 15) in an other family member; Physiological scientist (age of onset: 13) in an other family member; Physiological scientist (age of onset: 45) in her paternal aunt; Cancer-Breast (age of onset: 1) in an other family member; Cancer-Hematologic (age of onset: 81) in her daughter; Teresa Coombs (age of onset: 52) in her paternal uncle; Cancer-Lung (age of onset: 64) in an other family member; Cancer-Ovarian (age of onset: 96) in an other family member; Complete Hysterectomy in her sister; Complete Hysterectomy (age of onset: 52) in her daughter; Coronary Artery Disease in her father; Depression (age of onset: 14) in her brother; Diabetes Type II in  her maternal grandmother; Heart Attack (age of onset: 4) in her brother; Heart Disease in her father, mother, and sister; Heart murmur in her sister; Hypoglycemia in her mother; None Reported in her grandchild, grandchild, grandchild, grandchild, grandchild, sister, and another family member; Osteopenia in her sister; Other in her grandchild; Other (age of onset: 0) in her brother; Other (age of onset: 67) in her brother; Other (age of onset: 83) in her sister; Tobacco disorder in her daughter.    Social History     Socioeconomic History   ? Marital status: Divorced   ? Number of children: 2   ? Highest education level: GED or equivalent   Occupational History   ? Occupation: grounds Human resources officer: [name]   Tobacco Use   ? Smoking status: Former     Packs/day: 1.00     Years: 14.00     Pack years: 14.00     Types: Cigarettes     Quit date: 04/07/2000     Years since quitting: 21.1   ? Smokeless tobacco: Never   Vaping Use   ? Vaping Use: Never used Substance and Sexual Activity   ? Alcohol use: Not Currently   ? Drug use: No   ? Sexual activity: Never       Allergies   Allergen Reactions   ? Sulfa (Sulfonamide Antibiotics) RASH and NAUSEA AND VOMITING     Allergy recorded in SMS: Sulfa       There were no vitals filed for this visit.     Oswestry Total Score:: (P) 40    REVIEW OF SYSTEMS: 10 point ROS obtained and negative except per HPI      PHYSICAL EXAM:  Gen: Alert x 3  Chest: CTAB  Neck:Supple  Psych: Normal mood and affect  Skin: no rashes or lesions  Neuro: Grossly intact  Musc:     +Fortin, thrust, distraction, gaenselen, faber           IMPRESSION:    1. Sacroiliac joint dysfunction         PLAN:   Diagnostic Left SI joint injection   If attains greater than 50% relief, will proceed with posterior SI fusion.

## 2021-06-01 NOTE — Patient Instructions
Discharge Instructions for SI Joint Injection     Important information following your procedure today: You may drive today    This injection is for diagnostic purposes, it is a test. Only short-term results are expected.    Though the procedure is generally safe and complications are rare, we do ask that you be aware of any of the following:   Any swelling, persistent redness, new bleeding, or drainage from the site of the injection.  You should not experience a severe headache.  You should not run a fever over 101 F.  New onset of sharp, severe back & or neck pain.  New onset of upper or lower extremity numbness or weakness.  New difficulty controlling bowel or bladder function after the injection.  New shortness of breath.    If any of these occur, please call to report this occurrence to Dr. Otho Ket at [phone]. If you are calling after 4:00 p.m. or on weekends or holidays please call [phone] and ask to have the resident physician on call for the physician paged or go to your local emergency room.    Avoid application of direct heat, hot showers or hot tubs today.    Remain active today. Do the activities that would normally cause you pain. After the injection, you should get at least 80% relief for up to 2-4 hours.    Call back to report the results to a nurse tomorrow at Dr. Otho Ket at [phone]. You may have to leave a message. Give your name and contact information and answer the following questions.  Did you get relief?  Percentage of relief?  How long did it last?    If you are a candidate, a scheduler will call you within the next week to schedule your next procedure. The nurse will call you back if your results were not as expected to discuss next steps in your treatment plan.      The following medications were used: Bupivicaine        If you are unable to keep your upcoming appointment, please notify the Spine Center scheduler at [phone] at least 24 hours in advance.

## 2021-06-01 NOTE — Progress Notes

## 2021-06-01 NOTE — Procedures
Attending Surgeon: Bella Kennedy, MD    Anesthesia: Local    Pre-Procedure Diagnosis:   1. Sacroiliitis, not elsewhere classified (HCC)        Post-Procedure Diagnosis:   1. Sacroiliitis, not elsewhere classified (HCC)        Pain Score: Four    [place] AMB SPINE SI JOINT INJECT ANESTH/STEROID PROC  Location: sacroiliac joint L sacroiliac joint4/25/2023 10:15 AM    Consent:   Consent obtained: verbal and written  Consent given by: patient  Risks discussed: bleeding, damage to surrounding structures, hyperglycemia, infection, itching, nerve damage, pain, soft tissue reaction and skin discoloration  Alternatives discussed: alternative treatment, delayed treatment, no treatment and referral  Discussed with patient the purpose of the treatment/procedure, other ways of treating my condition, including no treatment/ procedure and the risks and benefits of the alternatives. Patient has decided to proceed with treatment/procedure.        Universal Protocol:  Relevant documents: relevant documents present and verified  Test results: test results available and properly labeled  Imaging studies: imaging studies available  Required items: required blood products, implants, devices, and special equipment available  Site marked: the operative site was marked  Patient identity confirmed: Patient identify confirmed verbally with patient.        Time out: Immediately prior to procedure a time out was called to verify the correct patient, procedure, equipment, support staff and site/side marked as required      Procedures Details:   Pre Procedure Pain Level - 8  At Least Three Positive findings with provacative maneuvers including - FABER, Distraction Test, Thigh thrust, SI Compression Test, Gaenslen's Test and Yeoman's SI Test  Reason for Procedure - Diagnostic  Patient experiences moderate to severe low back pain primarily over the anatomical location of the SI joints between the upper level of the iliac crests and the gluteal fold below L5 without radiculopathy, duration of at least three (3) months, and pain persists despite a minimum of four weeks of conservative therapies.   Clinical findings and/or imaging studies do not suggest other diagnosed or obvious cause of the lumbosacral pain (such as central spinal stenosis with neurogenic claudication/myelopathy, foraminal stenosis or disc herniation with concordant radicular pain/radiculopathy, infection, tumor, fracture, pseudoarthrosis, or pain related to spinal instrumentation).   Prep: 2% chlorhexidine  Guidance: fluoroscopy  Needle size: 22 G  Approach: posterior  Patient tolerance: Patient tolerated the procedure well with no immediate complications. Pressure was applied, and hemostasis was accomplished.  Comments:  3ml of 0.5%bupivicaine injected into the joint           Administrations This Visit     bupivacaine PF (MARCAINE) 0.5 % injection 3 mL     Admin Date  06/01/2021 Action  Given Dose  3 mL Route  Injection Administered By  Bella Kennedy, MD          iohexoL (OMNIPAQUE-240) 240 mg/mL injection 1 mL     Admin Date  06/01/2021 Action  Given Dose  1 mL Route  Epidural Administered By  Bella Kennedy, MD              Estimated blood loss: none or minimal  Specimens: none  Patient tolerated the procedure well with no immediate complications. Pressure was applied, and hemostasis was accomplished.

## 2021-06-02 ENCOUNTER — Encounter: Admit: 2021-06-02 | Discharge: 2021-06-02 | Payer: MEDICARE

## 2021-06-02 ENCOUNTER — Ambulatory Visit: Admit: 2021-06-02 | Discharge: 2021-06-02 | Payer: MEDICARE

## 2021-06-02 DIAGNOSIS — N8111 Cystocele, midline: Secondary | ICD-10-CM

## 2021-06-02 DIAGNOSIS — M2550 Pain in unspecified joint: Secondary | ICD-10-CM

## 2021-06-02 DIAGNOSIS — C801 Malignant (primary) neoplasm, unspecified: Secondary | ICD-10-CM

## 2021-06-02 DIAGNOSIS — F32A Depression: Secondary | ICD-10-CM

## 2021-06-02 DIAGNOSIS — Z1379 Encounter for other screening for genetic and chromosomal anomalies: Secondary | ICD-10-CM

## 2021-06-02 DIAGNOSIS — E785 Hyperlipidemia, unspecified: Secondary | ICD-10-CM

## 2021-06-02 DIAGNOSIS — M1990 Unspecified osteoarthritis, unspecified site: Secondary | ICD-10-CM

## 2021-06-02 DIAGNOSIS — M549 Dorsalgia, unspecified: Secondary | ICD-10-CM

## 2021-06-02 DIAGNOSIS — I38 Endocarditis, valve unspecified: Secondary | ICD-10-CM

## 2021-06-02 DIAGNOSIS — I10 Essential (primary) hypertension: Secondary | ICD-10-CM

## 2021-06-02 DIAGNOSIS — G629 Polyneuropathy, unspecified: Secondary | ICD-10-CM

## 2021-06-02 DIAGNOSIS — Z853 Personal history of malignant neoplasm of breast: Secondary | ICD-10-CM

## 2021-06-02 DIAGNOSIS — I8390 Asymptomatic varicose veins of unspecified lower extremity: Secondary | ICD-10-CM

## 2021-06-02 DIAGNOSIS — E162 Hypoglycemia, unspecified: Secondary | ICD-10-CM

## 2021-06-02 DIAGNOSIS — E559 Vitamin D deficiency, unspecified: Secondary | ICD-10-CM

## 2021-06-02 DIAGNOSIS — U071 COVID-19: Secondary | ICD-10-CM

## 2021-06-02 DIAGNOSIS — N3020 Other chronic cystitis without hematuria: Secondary | ICD-10-CM

## 2021-06-02 DIAGNOSIS — N816 Rectocele: Secondary | ICD-10-CM

## 2021-06-02 DIAGNOSIS — M810 Age-related osteoporosis without current pathological fracture: Secondary | ICD-10-CM

## 2021-06-02 DIAGNOSIS — M3500 Sicca syndrome, unspecified: Secondary | ICD-10-CM

## 2021-06-02 DIAGNOSIS — Z9109 Other allergy status, other than to drugs and biological substances: Secondary | ICD-10-CM

## 2021-06-02 DIAGNOSIS — E782 Mixed hyperlipidemia: Secondary | ICD-10-CM

## 2021-06-02 DIAGNOSIS — M5136 Other intervertebral disc degeneration, lumbar region: Secondary | ICD-10-CM

## 2021-06-02 DIAGNOSIS — C50212 Malignant neoplasm of upper-inner quadrant of left female breast: Secondary | ICD-10-CM

## 2021-06-02 DIAGNOSIS — K219 Gastro-esophageal reflux disease without esophagitis: Secondary | ICD-10-CM

## 2021-06-02 NOTE — Progress Notes
Date of Service: 06/02/2021    Subjective:             Kristen Norris is a 71 y.o. female.    History of Present Illness  Ms. Kristen Norris presents to clinic for follow-up of her urinary symptoms  History of neurogenic bladder with urinary urgency frequency and urge type incontinence    She also has a history of prolapse and is undergone anterior and posterior colporrhaphy for prolapse repair on 11-13-2020    She has been doing very well and is voiding without difficulty  She is very pleased with her overall results      Medical History:   Diagnosis Date   ? Arthritis    ? Back pain 07/18/2007   ? Back pain    ? Bladder infection, chronic 2017   ? Chest pain    ? COVID-19 12/28/2018   ? Degenerative disc disease, lumbar     Lo   ? Depression    ? Elevated triglycerides with high cholesterol    ? Environmental allergies    ? Essential hypertension     When in traffic   ? 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 Hereditary Cancer Update Test - [place])   ? GERD (gastroesophageal reflux disease)    ? H/O Sjogren's disease (HCC)    ? Heart valve disease 2020   ? Hyperlipemia    ? Hypoglycemia    ? Joint pain    ? 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 Neuropathy    ? Osteoporosis 03/2007    BMD  T-score (-2.3) spine and (-2.9) femur on Actonel   ? Other malignant neoplasm without specification of site    ? Personal history of malignant neoplasm of breast    ? PN (Peripheral Neuropathy)    ? Sjogren's disease (HCC)    ? Varicose veins 2019   ? Vitamin D deficiency 07/2007     Surgical History:   Procedure Laterality Date   ? CARDIAC CATHERIZATION  03/28/2005    LAD 20%, Circ ostial 30%, EF 65%   ? HX BREAST LUMPECTOMY  06/07/2005    Left with SLNBX (0/2)   ? COLONOSCOPY N/A 03/01/2010    normal (Kristen T. Olene Floss, [facility])   ? HX HYSTERECTOMY  03/24/2011    Kristen Lemming, MD TLH/BSO   ? Excision Posterior Neck mass N/A 09/04/2020    Performed by Freund, Kristen Norris., MD at [facility] OR   ? anterior colporrhaphy (cystocele repair);  Posterior colporrhaphy (rectocele repair); N/A 11/13/2020    Performed by Kristen Monday, MD at [facility] OR   ? CYSTOURETHROSCOPY N/A 11/13/2020    Performed by Kristen Monday, MD at [facility] OR   ? HX BLADDER SUSPENSION     ? HX TONSILLECTOMY  1979   ? HX TUBAL LIGATION     ? KNEE SURGERY  ]999   ? PR LAPAROSCOPY SURG RPR INITIAL INGUINAL HERNIA  1982     Social History     Socioeconomic History   ? Marital status: Divorced   ? Number of children: 2   ? Highest education level: GED or equivalent   Occupational History   ? Occupation: grounds Human resources officer: Kristen [employer]   Tobacco Use   ? Smoking  status: Former     Packs/day: 1.00     Years: 14.00     Pack years: 14.00     Types: Cigarettes     Quit date: 04/07/2000     Years since quitting: 21.1   ? Smokeless tobacco: Never   Vaping Use   ? Vaping Use: Never used   Substance and Sexual Activity   ? Alcohol use: Not Currently   ? Drug use: No   ? Sexual activity: Never     Family History   Problem Relation Age of Onset   ? Heart Disease Mother    ? Hypoglycemia Mother    ? Heart Disease Father         farmer   ? Coronary Artery Disease Father    ? Arthritis Father    ? Heart Attack Brother 45   ? Other Brother 75        shot himself 06/30/2019   ? Complete Hysterectomy Sister         rotator cuff surgery   ? Other Sister 71        COVID complications   ? Diabetes Type II Maternal Grandmother    ? Cancer-Hematologic Daughter 12   ? COPD Daughter         on oxygen   ? Tobacco disorder Daughter         on Chantix   ? Complete Hysterectomy Daughter 91   ? None Reported Grandchild    ? Other Grandchild         wrestler-stomach problems   ? None Reported Grandchild    ? None Reported Grandchild    ? None Reported Grandchild    ? None Reported Grandchild    ? None Reported Sister    ? Heart murmur Sister    ? Heart Disease Sister    ? Osteopenia Sister    ? Depression Brother 56        hung himself 05/2017   ? Other Brother 0        died in utero-umbilical cord wrapped around him   ? Cancer-Breast Paternal Aunt 55        bilateral mastectomy   ? Cancer-Breast Other 60        paternal 1st cousin   ? Cancer-Lung Other 48        paternal 1st cousin   ? Cancer-Breast Other 40        paternal 1st cousin   ? Cancer-Ovarian Other 25   ? None Reported Other    ? Cancer-Breast Other 42        paternal 1st cousin   ? Brain Tumor Other 47        nephew   ? Cancer-Lung Paternal Uncle 45   ? Cervical Cancer Neg Hx    ? Cancer-Colon Neg Hx    ? Cancer-Uterine Neg Hx    ? Bleeding Disorders Neg Hx    ? Diabetes Neg Hx      Allergies   Allergen Reactions   ? Sulfa (Sulfonamide Antibiotics) RASH and NAUSEA AND VOMITING     Allergy recorded in SMS: Sulfa                Review of Systems      Objective:         ? atorvastatin (LIPITOR) 20 mg tablet Take 1 tablet by mouth once daily   ? benzonatate (TESSALON PERLES) 100 mg capsule Take one capsule by mouth  every 8 hours as needed for Cough.   ? CALCIUM CARBONATE (CALCIUM 500 PO) Take 1 Cap by mouth Daily.   ? cevimeline (EVOXAC) 30 mg capsule One tab daily for one week then one tab twice daily with meals   ? Cholecalciferol (VITAMIN D3) 125 mcg (5,000 unit) capsule Take two capsules by mouth daily.   ? duloxetine DR (CYMBALTA) 60 mg capsule Take one capsule by mouth daily.   ? flecainide (TAMBOCOR) 50 mg tablet Take 1 tablet by mouth twice daily   ? hydrOXYchloroQUINE (PLAQUENIL) 200 mg tablet Take 1.5 tablets by mouth daily. Take with food.   ? hyoscyamine sulfate (LEVSIN/SL) 0.125 mg sublingual tablet Place one tablet under tongue every 4 hours as needed. Indications: Bladder spasms   ? metoprolol XL (TOPROL XL) 50 mg extended release tablet Take 1 tablet by mouth once daily   ? oxybutynin XL (DITROPAN XL) 10 mg tablet TAKE 1 TABLET BY MOUTH ONCE DAILY DO  NOT  CUT  CRUSH  OR  CHEW   ? polyethylene glycol 3350 (MIRALAX) 17 gram/dose powder Take seventeen g by mouth daily.   ? traMADoL (ULTRAM) 50 mg tablet Take one tablet by mouth every 4 hours as needed for Pain. Indications: neuropathic pain, pain     Vitals:    06/02/21 0837   BP: (!) 138/93   BP Source: Arm, Right Upper   Pulse: 65   Temp: 36.2 ?C (97.2 ?F)   SpO2: 98%   TempSrc: Temporal   PainSc: Seven   Weight: 61.7 kg (136 lb)   Height: 158.8 cm (5' 2.5)     Body mass index is 24.48 kg/m?Marland Kitchen     Physical Exam  Vitals reviewed.   Constitutional:       General: She is not in acute distress.     Appearance: Normal appearance. She is well-developed and normal weight. She is not ill-appearing.   HENT:      Head: Normocephalic and atraumatic.   Eyes:      Extraocular Movements: Extraocular movements intact.   Pulmonary:      Effort: Pulmonary effort is normal. No respiratory distress.   Genitourinary:     Comments: No vaginal masses  No prolapse  Musculoskeletal:         General: Normal range of motion.      Cervical back: Normal range of motion.   Skin:     General: Skin is warm and dry.   Neurological:      General: No focal deficit present.      Mental Status: She is alert and oriented to person, place, and time. Mental status is at baseline.   Psychiatric:         Mood and Affect: Mood normal.         Behavior: Behavior normal.         Thought Content: Thought content normal.         Judgment: Judgment normal.              Assessment and Plan:  Ms. Antill presents to clinic for follow-up as outlined  She has undergone anterior and posterior colporrhaphy for prolapse repair on 11-13-2020  No evidence of recurrence  Her voiding symptoms are stable    She will return to my clinic on an as-needed basis or sooner for problems      History and examination reviewed and key elements confirmed.  Discussed with the patient in detail and questions answered.  I personally saw and evaluated the patient and determined the plan of care.    Calden Dorsey L. Littie Deeds, MD, MPH    Total visit time > 30 minutes including review of records, history, exam, counseling, coordination of care, and documentation.  Coding also based on visit complexity.

## 2021-06-02 NOTE — Telephone Encounter
06/01/21    Pain Score: Four    [place] AMB SPINE SI JOINT INJECT ANESTH/STEROID PROC  Location: sacroiliac joint L sacroiliac joint4/25/2023 10:15 AM  She reports that she got 99% pain relief in her left SI joint lasting 5 hours.

## 2021-06-04 ENCOUNTER — Encounter: Admit: 2021-06-04 | Discharge: 2021-06-04 | Payer: MEDICARE

## 2021-06-18 ENCOUNTER — Encounter: Admit: 2021-06-18 | Discharge: 2021-06-18 | Payer: MEDICARE

## 2021-06-18 DIAGNOSIS — I10 Essential (primary) hypertension: Secondary | ICD-10-CM

## 2021-06-18 DIAGNOSIS — E559 Vitamin D deficiency, unspecified: Secondary | ICD-10-CM

## 2021-06-18 DIAGNOSIS — G629 Polyneuropathy, unspecified: Secondary | ICD-10-CM

## 2021-06-18 DIAGNOSIS — F32A Depression: Secondary | ICD-10-CM

## 2021-06-18 DIAGNOSIS — M5136 Other intervertebral disc degeneration, lumbar region: Secondary | ICD-10-CM

## 2021-06-18 DIAGNOSIS — M1990 Unspecified osteoarthritis, unspecified site: Secondary | ICD-10-CM

## 2021-06-18 DIAGNOSIS — E782 Mixed hyperlipidemia: Secondary | ICD-10-CM

## 2021-06-18 DIAGNOSIS — M2550 Pain in unspecified joint: Secondary | ICD-10-CM

## 2021-06-18 DIAGNOSIS — E785 Hyperlipidemia, unspecified: Secondary | ICD-10-CM

## 2021-06-18 DIAGNOSIS — C801 Malignant (primary) neoplasm, unspecified: Secondary | ICD-10-CM

## 2021-06-18 DIAGNOSIS — M810 Age-related osteoporosis without current pathological fracture: Secondary | ICD-10-CM

## 2021-06-18 DIAGNOSIS — C50212 Malignant neoplasm of upper-inner quadrant of left female breast: Secondary | ICD-10-CM

## 2021-06-18 DIAGNOSIS — I38 Endocarditis, valve unspecified: Secondary | ICD-10-CM

## 2021-06-18 DIAGNOSIS — Z1379 Encounter for other screening for genetic and chromosomal anomalies: Secondary | ICD-10-CM

## 2021-06-18 DIAGNOSIS — I8390 Asymptomatic varicose veins of unspecified lower extremity: Secondary | ICD-10-CM

## 2021-06-18 DIAGNOSIS — Z853 Personal history of malignant neoplasm of breast: Secondary | ICD-10-CM

## 2021-06-18 DIAGNOSIS — M549 Dorsalgia, unspecified: Secondary | ICD-10-CM

## 2021-06-18 DIAGNOSIS — N3020 Other chronic cystitis without hematuria: Secondary | ICD-10-CM

## 2021-06-18 DIAGNOSIS — E162 Hypoglycemia, unspecified: Secondary | ICD-10-CM

## 2021-06-18 DIAGNOSIS — U071 COVID-19: Secondary | ICD-10-CM

## 2021-06-18 DIAGNOSIS — K219 Gastro-esophageal reflux disease without esophagitis: Secondary | ICD-10-CM

## 2021-06-18 DIAGNOSIS — M3500 Sicca syndrome, unspecified: Secondary | ICD-10-CM

## 2021-06-18 DIAGNOSIS — Z9109 Other allergy status, other than to drugs and biological substances: Secondary | ICD-10-CM

## 2021-06-19 ENCOUNTER — Encounter: Admit: 2021-06-19 | Discharge: 2021-06-19 | Payer: MEDICARE

## 2021-06-19 ENCOUNTER — Ambulatory Visit: Admit: 2021-06-18 | Discharge: 2021-06-19 | Payer: MEDICARE

## 2021-06-19 DIAGNOSIS — R202 Paresthesia of skin: Secondary | ICD-10-CM

## 2021-06-19 DIAGNOSIS — R200 Anesthesia of skin: Secondary | ICD-10-CM

## 2021-06-26 ENCOUNTER — Encounter: Admit: 2021-06-26 | Discharge: 2021-06-26 | Payer: MEDICARE

## 2021-06-28 ENCOUNTER — Encounter: Admit: 2021-06-28 | Discharge: 2021-06-28 | Payer: MEDICARE

## 2021-06-28 NOTE — Telephone Encounter
Patient has gotten her CT L spine at [facility], and she will schedule follow up in clinic for results.  Request imaging and Report for CT.

## 2021-06-29 ENCOUNTER — Encounter: Admit: 2021-06-29 | Discharge: 2021-06-29 | Payer: MEDICARE

## 2021-07-08 ENCOUNTER — Encounter: Admit: 2021-07-08 | Discharge: 2021-07-08 | Payer: MEDICARE

## 2021-07-08 MED ORDER — ZOLEDRONIC ACID-MANNITOL-WATER 5 MG/100 ML IV PGBK
5 mg | Freq: Once | INTRAVENOUS | 0 refills
Start: 2021-07-08 — End: ?

## 2021-07-08 NOTE — Telephone Encounter
Message from patient stating she had tried to schedule her Reclast infusion however did not have any orders. Orders placed for co-sign.

## 2021-07-22 ENCOUNTER — Ambulatory Visit: Admit: 2021-07-22 | Discharge: 2021-07-22 | Payer: MEDICARE

## 2021-07-22 ENCOUNTER — Encounter: Admit: 2021-07-22 | Discharge: 2021-07-22 | Payer: MEDICARE

## 2021-07-22 DIAGNOSIS — Z1379 Encounter for other screening for genetic and chromosomal anomalies: Secondary | ICD-10-CM

## 2021-07-22 DIAGNOSIS — N3020 Other chronic cystitis without hematuria: Secondary | ICD-10-CM

## 2021-07-22 DIAGNOSIS — C801 Malignant (primary) neoplasm, unspecified: Secondary | ICD-10-CM

## 2021-07-22 DIAGNOSIS — M549 Dorsalgia, unspecified: Secondary | ICD-10-CM

## 2021-07-22 DIAGNOSIS — Z9109 Other allergy status, other than to drugs and biological substances: Secondary | ICD-10-CM

## 2021-07-22 DIAGNOSIS — E162 Hypoglycemia, unspecified: Secondary | ICD-10-CM

## 2021-07-22 DIAGNOSIS — U071 COVID-19: Secondary | ICD-10-CM

## 2021-07-22 DIAGNOSIS — M810 Age-related osteoporosis without current pathological fracture: Secondary | ICD-10-CM

## 2021-07-22 DIAGNOSIS — I8390 Asymptomatic varicose veins of unspecified lower extremity: Secondary | ICD-10-CM

## 2021-07-22 DIAGNOSIS — G629 Polyneuropathy, unspecified: Secondary | ICD-10-CM

## 2021-07-22 DIAGNOSIS — M3500 Sicca syndrome, unspecified: Secondary | ICD-10-CM

## 2021-07-22 DIAGNOSIS — C50212 Malignant neoplasm of upper-inner quadrant of left female breast: Secondary | ICD-10-CM

## 2021-07-22 DIAGNOSIS — M533 Sacrococcygeal disorders, not elsewhere classified: Secondary | ICD-10-CM

## 2021-07-22 DIAGNOSIS — E785 Hyperlipidemia, unspecified: Secondary | ICD-10-CM

## 2021-07-22 DIAGNOSIS — Z853 Personal history of malignant neoplasm of breast: Secondary | ICD-10-CM

## 2021-07-22 DIAGNOSIS — K219 Gastro-esophageal reflux disease without esophagitis: Secondary | ICD-10-CM

## 2021-07-22 DIAGNOSIS — M1990 Unspecified osteoarthritis, unspecified site: Secondary | ICD-10-CM

## 2021-07-22 DIAGNOSIS — E782 Mixed hyperlipidemia: Secondary | ICD-10-CM

## 2021-07-22 DIAGNOSIS — I10 Essential (primary) hypertension: Secondary | ICD-10-CM

## 2021-07-22 DIAGNOSIS — I38 Endocarditis, valve unspecified: Secondary | ICD-10-CM

## 2021-07-22 DIAGNOSIS — F32A Depression: Secondary | ICD-10-CM

## 2021-07-22 DIAGNOSIS — M2550 Pain in unspecified joint: Secondary | ICD-10-CM

## 2021-07-22 DIAGNOSIS — E559 Vitamin D deficiency, unspecified: Secondary | ICD-10-CM

## 2021-07-22 DIAGNOSIS — M5136 Other intervertebral disc degeneration, lumbar region: Secondary | ICD-10-CM

## 2021-07-22 MED ORDER — METOPROLOL SUCCINATE 50 MG PO TB24
ORAL_TABLET | ORAL | 3 refills | 90.00000 days | Status: AC
Start: 2021-07-22 — End: ?

## 2021-07-22 NOTE — Patient Instructions
It was a pleasure seeing you in clinic today at the [facility] Clydene Pugh, MD Sp[facility] at [facility].    We encourage you to review the following to learn more about your health:  Education attached to this visit summary  [email or web address]    For questions about your plan of care, don't hesitate to send [place] a MyChart message or call our nursing line at  [phone].    For questions related to scheduling, please call our scheduling line at [phone].     If you need a refill of your medication, please contact your pharmacy directly or use the Refill Request option on MyChart. Please allow 72 hours for these requests to be completed. Please note that certain medications may require regular clinic visits     For any imaging or labs;  if you're active on MyChart, you will receive a notification once your test results have been reviewed & released by your physician. If you are not active on MyChart, you will receive your results at your next clinic visit. We will call you if there are any urgent findings.         linQ SI Joint Fusion Surgery Post-Operative Instructions  The following guidelines are recommended after spine surgery to ensure a good recovery. You may be  given additional instructions by your surgeon when discharged. If you have any questions or problems  please contact our office at [phone]    Activity  You may be up and about to take care of your personal needs but avoid any strenuous activity. Do not put  yourself in a position where you could fall.  Do not lift more than 8 pounds (equivalent to about a gallon of milk). Avoid pushing or pulling activity.  Going up and down stairs is permissible. Be sure to use the handrails and take one step at a time until  comfortable. Take precautions to prevent falls and use assistance if unsure. If you were given TED hose  stockings you may discontinue once you are up and walking daily.  Avoid bending or twisting at the waist. Bend at your knees (squat) when picking up objects. Avoid  sitting for longer than 45 minutes to one hour at a time. Sitting for longer periods of time may add to  your discomfort. Take a 10 minute break to get up and move around or lie down before sitting again.    Exercise  Walking is the best ?exercise? after surgery and you need to walk DAILY. You should not engage in any  other exercise until instructed by your physician. Gradually increase the distance you walk and, if  weather permits, you may walk outside. You should be able to gradually increase your distance until you  can walk about one mile within one to two months after surgery. Ladies avoid high heels for the first  month after surgery.    Incision Care  Keep the incision dry for 48 hours after surgery. You do not need to apply any ointment. You do not  need to keep the incision covered unless there is drainage from the incision. Contact us if drainage  persists for more than 2 days or if you have redness or swelling around the incision.  You will have steri-strips (small adhesive strips) over the incision. It is ideal for the strips stay in place  for 10 days. Do not be concerned if the steri-strip edges roll up or fall off before then. If you  still have  the steri-strips after 10 days remove them.  If you have fevers or chills, take your temperature with a thermometer. If you have a temperature of 101  degrees Fahrenheit or 38.3 degrees Celsius or higher, contact our office.    Bathing  You may SHOWER and get the incision wet after 48 hours. Avoid scrubbing your incision site. Do  NOT soak the incision; so avoid baths, hot tubs or swimming for 1 month after surgery. It is normal for  the incision site to itch, but avoid scratching.    Driving  Do not drive for 24 hours. You may ride in an automobile for short distances as tolerated.    Pain Medication  You will be given a prescription for pain medication when you are discharged from the hospital. You  may take the pain medication with a snack or meal if stomach upset occurs. If you need a refill,  have your pharmacy fax a refill request to our office at , or tell the pharmacy that we  participate with [employer] (the request will be sent electronically).    Diet  Eat a healthy, well balanced diet and avoid extra calories. You may have a decreased appetite after  surgery.    Constipation  You may be constipated after your surgery, so increase your intake of fiber (fruits and vegetables) and  fluid (unless instructed otherwise). You may use your choice of over-the-counter laxatives (such as  Senokot S, Dulcolax, Colace, or Milk of Magnesia). If you do not have a bowel movement, use an overthe-  counter enema (i.e. Fleets Enema) as indicated on the bottle. If you are still unable to have a bowel  movement, or have nausea, vomiting or abdominal bloating, contact your family doctor for instructions.    Smoking  You should not smoke after surgery. Smoking decreases the rate of skin and bone healing. Smoking also  interferes with the effectiveness of your pain medication. The hospital campuses are smoke-free and you  will not be allowed to go outside to smoke. Contact your primary care physician for smoking cessation  options prior to surgery if needed.    Office Follow-up  You will need a post-operative appointment for an incision check and follow-up at 7-10 days after  surgery. You will also be seen at 3 months after surgery.         Save these instructions for reference after your surgery   If you have any questions about your surgical procedure or  post-operative management, call [phone].

## 2021-07-22 NOTE — Progress Notes
SPINE CENTER CLINIC NOTE       SUBJECTIVE:   Patient returns to clinic today after successful diagnostic SIJ injection on the left.  Patient wishes to have SIJ fusion performed.    Patient experiences moderate to severe low back pain primarily over the anatomical location of the SI joints between the upper level of the iliac crests and the gluteal fold below L5 without radiculopathy, duration of at least three (3) months, and pain persists despite a minimum of four weeks of conservative therapies, including physical therapy    Clinical findings and/or imaging studies do not suggest other diagnosed or obvious cause of the lumbosacral pain or previously treated causes (such as central spinal stenosis with neurogenic claudication/myelopathy, foraminal stenosis or disc herniation with concordant radicular pain/radiculopathy, infection, tumor, fracture, pseudoarthrosis, or pain related to spinal instrumentation).    At least three positive findings with provocative maneuvers include: FABER, SI Compression Test, SI Distraction test and Thigh thrust    Diagnostic: The flouro-guided diagnostic SI joint injection provided 75% sustained and constant pain relief for the duration of the local anesthetic, reduced pain score from 8/10 pre-procedure to 1/10 post-procedure, and improved function during this time.            Review of Systems    Current Outpatient Medications:   ?  atorvastatin (LIPITOR) 20 mg tablet, Take 1 tablet by mouth once daily, Disp: 90 tablet, Rfl: 3  ?  benzonatate (TESSALON PERLES) 100 mg capsule, Take one capsule by mouth every 8 hours as needed for Cough., Disp: , Rfl:   ?  CALCIUM CARBONATE (CALCIUM 500 PO), Take 1 Cap by mouth Daily., Disp: , Rfl:   ?  cevimeline (EVOXAC) 30 mg capsule, One tab daily for one week then one tab twice daily with meals, Disp: 60 capsule, Rfl: 1  ?  Cholecalciferol (VITAMIN D3) 125 mcg (5,000 unit) capsule, Take two capsules by mouth daily., Disp: , Rfl:   ?  duloxetine DR (CYMBALTA) 60 mg capsule, Take one capsule by mouth daily., Disp: 90 capsule, Rfl: 1  ?  flecainide (TAMBOCOR) 50 mg tablet, Take 1 tablet by mouth twice daily, Disp: 60 tablet, Rfl: 11  ?  hydrOXYchloroQUINE (PLAQUENIL) 200 mg tablet, Take 1.5 tablets by mouth daily. Take with food., Disp: 135 tablet, Rfl: 0  ?  hyoscyamine sulfate (LEVSIN/SL) 0.125 mg sublingual tablet, Place one tablet under tongue every 4 hours as needed. Indications: Bladder spasms, Disp: 30 tablet, Rfl: 1  ?  metoprolol succinate XL (TOPROL XL) 50 mg extended release tablet, Take 1 tablet by mouth once daily, Disp: 90 tablet, Rfl: 3  ?  oxybutynin XL (DITROPAN XL) 10 mg tablet, TAKE 1 TABLET BY MOUTH ONCE DAILY DO  NOT  CUT  CRUSH  OR  CHEW, Disp: 90 tablet, Rfl: 3  ?  polyethylene glycol 3350 (MIRALAX) 17 gram/dose powder, Take seventeen g by mouth daily., Disp: 527 g, Rfl: 1  ?  traMADoL (ULTRAM) 50 mg tablet, Take one tablet by mouth every 4 hours as needed for Pain. Indications: neuropathic pain, pain, Disp: 30 tablet, Rfl: 0  Allergies   Allergen Reactions   ? Sulfa (Sulfonamide Antibiotics) RASH and NAUSEA AND VOMITING     Allergy recorded in SMS: Sulfa     Physical Exam  Vitals:    07/22/21 1339   BP: (!) 141/75   BP Source: Arm, Right Upper   Pulse: 79   SpO2: 97%   PainSc: Six  Oswestry Total Score:: 36  Pain Score: Six  There is no height or weight on file to calculate BMI.           IMPRESSION:  1. Sacroiliac joint dysfunction          PLAN:    1. Will schedule for Left SI joint fusion at first available appointment.   2. Continue current regimen.  3. Continue lifestyle modifications. .   4. Follow up after procedure.     Risks/benefits of all pharmacologic and interventional treatments discussed and questions answered.     Annye Rusk, MD  Interventional Pain Medicine Fellow, PGY-5  Available on [facility]    ATTESTATION    I personally performed the key portions of the E/M visit, discussed case with resident and concur with resident documentation of history, physical exam, assessment, and treatment plan unless otherwise noted.    Staff name:  Bella Kennedy, MD Date: 07/22/2021

## 2021-07-27 ENCOUNTER — Encounter: Admit: 2021-07-27 | Discharge: 2021-07-27 | Payer: MEDICARE

## 2021-07-27 MED ORDER — DULOXETINE 60 MG PO CPDR
60 mg | ORAL_CAPSULE | Freq: Every day | ORAL | 0 refills | 60.00000 days | Status: AC
Start: 2021-07-27 — End: ?

## 2021-08-03 ENCOUNTER — Encounter: Admit: 2021-08-03 | Discharge: 2021-08-03 | Payer: MEDICARE

## 2021-08-03 DIAGNOSIS — M533 Sacrococcygeal disorders, not elsewhere classified: Secondary | ICD-10-CM

## 2021-08-03 NOTE — Telephone Encounter
Patient scheduled for Left SI joint linq posterior fusion via posterior approach on July 18th at [facility].  No blood thinners. Hold food and water after midnight the night prior to surgery, follow up for post op care.  Sent information to patient's mychart.

## 2021-08-05 ENCOUNTER — Ambulatory Visit: Admit: 2021-08-05 | Discharge: 2021-08-05 | Payer: MEDICARE

## 2021-08-05 DIAGNOSIS — M533 Sacrococcygeal disorders, not elsewhere classified: Secondary | ICD-10-CM

## 2021-08-12 ENCOUNTER — Encounter: Admit: 2021-08-12 | Discharge: 2021-08-12 | Payer: MEDICARE

## 2021-08-16 ENCOUNTER — Encounter: Admit: 2021-08-16 | Discharge: 2021-08-16 | Payer: MEDICARE

## 2021-08-16 DIAGNOSIS — M5416 Radiculopathy, lumbar region: Secondary | ICD-10-CM

## 2021-08-24 ENCOUNTER — Encounter: Admit: 2021-08-24 | Discharge: 2021-08-24 | Payer: MEDICARE

## 2021-08-26 ENCOUNTER — Ambulatory Visit: Admit: 2021-08-26 | Discharge: 2021-08-26 | Payer: MEDICARE

## 2021-08-26 ENCOUNTER — Encounter: Admit: 2021-08-26 | Discharge: 2021-08-26 | Payer: MEDICARE

## 2021-08-26 DIAGNOSIS — M5416 Radiculopathy, lumbar region: Secondary | ICD-10-CM

## 2021-08-26 DIAGNOSIS — M5136 Other intervertebral disc degeneration, lumbar region: Secondary | ICD-10-CM

## 2021-08-26 MED ORDER — IOHEXOL 240 MG IODINE/ML IV SOLN
1 mL | Freq: Once | EPIDURAL | 0 refills | Status: CP
Start: 2021-08-26 — End: ?
  Administered 2021-08-26: 18:00:00 1 mL via EPIDURAL

## 2021-08-26 MED ORDER — METHYLPREDNISOLONE ACETATE 80 MG/ML IJ SUSP
80 mg | Freq: Once | INTRAMUSCULAR | 0 refills | Status: CP
Start: 2021-08-26 — End: ?
  Administered 2021-08-26: 18:00:00 80 mg via INTRAMUSCULAR

## 2021-08-26 NOTE — Procedures
Attending Surgeon: Jerilynn Som, MD    Anesthesia: Local    Pre-Procedure Diagnosis:   1. Lumbar radiculopathy    2. Lumbar degenerative disc disease        Post-Procedure Diagnosis:   1. Lumbar radiculopathy    2. Lumbar degenerative disc disease        Pain Score: Six    [place] [facility] SPINE TRANSFORAMINAL LUMBAR/SACRAL  Procedure: transforaminal epidural    Laterality: bilateral   on 08/26/2021 1:15 PM  Location: lumbar -  L4-5      Consent:   Consent obtained: written  Consent given by: patient    Discussed with patient the purpose of the treatment/procedure, other ways of treating my condition, including no treatment/ procedure and the risks and benefits of the alternatives. Patient has decided to proceed with treatment/procedure.   Procedures Details:   Indications: pain and diagnostic evaluation   Prep: chlorhexidine  Patient position: prone  Estimated Blood Loss: minimal  Specimens: none  Number of Levels: 2  Approach: left paramedian  Guidance: fluoroscopy  Contrast: Procedure confirmed with contrast under live fluoroscopy.  Needle and Epidural Catheter: quincke  Needle size: 25 G  Injection procedure: Incremental injection, Negative aspiration for blood and Introduced needle  Patient tolerance: Patient tolerated the procedure well with no immediate complications. Pressure was applied, and hemostasis was accomplished.  Outcome: Pain relieved and Pain improved  Comments: 1ml lido and 40mg  depomedrol inj bilaterally    This patient's clinical history, exam, AND imaging support radiculopathy AND there is a significant impact on quality of life and function AND the pain has been present for at least 4 weeks AND they have failed to improve with noninvasive conservative care.  This patient had at least 50% pain relief for at least 3 months with the last epidural injection.            Estimated blood loss: none or minimal  Specimens: none  Patient tolerated the procedure well with no immediate complications. Pressure was applied, and hemostasis was accomplished.

## 2021-08-26 NOTE — Progress Notes

## 2021-08-26 NOTE — Progress Notes
[facility]INE [facility]  INTERVENTIONAL PAIN PROCEDURE HISTORY AND PHYSICAL    Chief Complaint: Pain    HISTORY OF PRESENT ILLNESS:  Back pain  Due to have SI fusion in sept    Medical History:   Diagnosis Date   ? Arthritis    ? Back pain 07/18/2007   ? Back pain    ? Bladder infection, chronic 2017   ? Chest pain    ? COVID-19 12/28/2018   ? Degenerative disc disease, lumbar     Lo   ? Depression    ? Elevated triglycerides with high cholesterol    ? Environmental allergies    ? Essential hypertension     When in traffic   ? 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 Hereditary Cancer Update Test - [place])   ? GERD (gastroesophageal reflux disease)    ? H/O Sjogren's disease (HCC)    ? Heart valve disease 2020   ? Hyperlipemia    ? Hypoglycemia    ? Joint pain    ? 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 Neuropathy    ? Osteoporosis 03/2007    BMD  T-score (-2.3) spine and (-2.9) femur on Actonel   ? Other malignant neoplasm without specification of site    ? Personal history of malignant neoplasm of breast    ? PN (Peripheral Neuropathy)    ? Sjogren's disease (HCC)    ? Varicose veins 2019   ? Vitamin D deficiency 07/2007       Surgical History:   Procedure Laterality Date   ? CARDIAC CATHERIZATION  03/28/2005    LAD 20%, Circ ostial 30%, EF 65%   ? HX BREAST LUMPECTOMY  06/07/2005    Left with SLNBX (0/2)   ? COLONOSCOPY N/A 03/01/2010    normal ([name], [facility])   ? HX HYSTERECTOMY  03/24/2011    [name], MD TL[facility]/BSO   ? Excision Posterior Neck mass N/A 09/04/2020    Performed by Freund, [name]., MD at [facility] OR   ? anterior colporrhaphy (cystocele repair);  Posterior colporrhaphy (rectocele repair); N/A 11/13/2020    Performed by [name], MD at [facility] OR   ? CYSTOURETHROSCOPY N/A 11/13/2020    Performed by [name], MD at [facility] OR   ? HX BLADDER SUSPENSION     ? HX TONSILLECTOMY  1979   ? HX TUBAL LIGATION     ? KNEE SURGERY  ]999   ? PR LAPAROSCOPY SURG RPR INITIAL INGUINAL HERNIA  1982       family history includes Arthritis in her father; Brain Tumor (age of onset: 28) in an other family member; COPD in her daughter; Essie Christine (age of onset: 38) in an other family member; Physiological scientist (age of onset: 32) in an other family member; Physiological scientist (age of onset: 1) in her paternal aunt; Cancer-Breast (age of onset: 84) in an other family member; Cancer-Hematologic (age of onset: 48) in her daughter; Teresa Coombs (age of onset: 40) in her paternal uncle; Cancer-Lung (age of onset: 64) in an other family member; Cancer-Ovarian (age of onset: 44) in an other family member; Complete Hysterectomy in her sister; Complete Hysterectomy (age of onset: 24) in her daughter; Coronary Artery Disease in her father; Depression (age of onset: 90) in her brother;  Diabetes Type II in her maternal grandmother; Heart Attack (age of onset: 29) in her brother; Heart Disease in her father, mother, and sister; Heart murmur in her sister; Hypoglycemia in her mother; None Reported in her grandchild, grandchild, grandchild, grandchild, grandchild, sister, and another family member; Osteopenia in her sister; Other in her grandchild; Other (age of onset: 0) in her brother; Other (age of onset: 76) in her brother; Other (age of onset: 81) in her sister; Tobacco disorder in her daughter.    Social History     Socioeconomic History   ? Marital status: Divorced   ? Number of children: 2   ? Highest education level: GED or equivalent   Occupational History   ? Occupation: grounds Human resources officer: [name]   Tobacco Use   ? Smoking status: Former     Packs/day: 1.00     Years: 14.00     Pack years: 14.00     Types: Cigarettes     Quit date: 04/07/2000     Years since quitting: 21.4   ? Smokeless tobacco: Never   Vaping Use   ? Vaping Use: Never used   Substance and Sexual Activity   ? Alcohol use: Not Currently   ? Drug use: No   ? Sexual activity: Never       Allergies   Allergen Reactions   ? Sulfa (Sulfonamide Antibiotics) RASH and NAUSEA AND VOMITING     Allergy recorded in SMS: Sulfa       Vitals:    08/26/21 1300   BP: 122/65   BP Source: Arm, Right Upper   Pulse: 68   Temp: 36.4 ?C (97.5 ?F)   Resp: 16   SpO2: 99%   TempSrc: Oral   PainSc: Six   Weight: 61.7 kg (136 lb)   Height: 158.8 cm (5' 2.5)     Pain Score: Six       REVIEW OF SYSTEMS: 10 point ROS obtained and negative except pain      PHYSICAL EXAM:unchanged        IMPRESSION:    1. Lumbar radiculopathy    2. Lumbar degenerative disc disease         PLAN: Lumbar Transforaminal Steroid Injection

## 2021-08-26 NOTE — Patient Instructions
Procedure Completed Today: Lumbar Transforaminal Steroid Injection    Important information following your procedure today: You may drive today    Pain relief may not be immediate. It is possible you may even experience an increase in pain during the first 24-48 hours followed by a gradual decrease of your pain.  Though the procedure is generally safe and complications are rare, we Tamer Baughman ask that you be aware of any of the following:   Any swelling, persistent redness, new bleeding, or drainage from the site of the injection.  You should not experience a severe headache.  You should not run a fever over 101? F.  New onset of sharp, severe back & or neck pain.  New onset of upper or lower extremity numbness or weakness.  New difficulty controlling bowel or bladder function after the injection.  New shortness of breath.    If any of these occur, please call to report this occurrence to a nurse at [phone]. If you are calling after 4:00 p.m., on weekends or holidays please call [phone] and ask to have the resident physician on call for the physician paged or go to your local emergency room.  You may experience soreness at the injection site. Ice can be applied at 20 minute intervals. Avoid application of direct heat, hot showers or hot tubs today.  Avoid strenuous activity today. You may resume your regular activities and exercise tomorrow.  Patients with diabetes may see an elevation in blood sugars for 7-10 days after the injection. It is important to pay close attention to your diet, check your blood sugars daily and report extreme elevations to the physician that treats your diabetes.  Patients taking a daily blood thinner can resume their regular dose this evening.  It is important that you take all medications ordered by your pain physician. Taking medication as ordered is an important part of your pain care plan. If you cannot continue the medication plan, please notify the physician.     Possible side effects to steroids that may occur:  Flushing or redness of the face  Irritability  Fluid retention  Change in women?s menses    The following medications were used: Lidocaine , Depomedrol, and Contrast Dye

## 2021-08-31 ENCOUNTER — Encounter: Admit: 2021-08-31 | Discharge: 2021-08-31 | Payer: MEDICARE

## 2021-09-10 ENCOUNTER — Encounter: Admit: 2021-09-10 | Discharge: 2021-09-10 | Payer: MEDICARE

## 2021-09-10 NOTE — Telephone Encounter
Patient is scheduled September 5th at [facility] for Left SI Joint fusion. No blood thinners.  Patient voiced understanding of the plan.

## 2021-09-21 ENCOUNTER — Encounter: Admit: 2021-09-21 | Discharge: 2021-09-21 | Payer: MEDICARE

## 2021-09-21 MED ORDER — HYDROXYCHLOROQUINE 200 MG PO TAB
ORAL_TABLET | ORAL | 0 refills | 90.00000 days | Status: AC
Start: 2021-09-21 — End: ?

## 2021-09-21 NOTE — Telephone Encounter
Pharmacy is requesting a refill of HCQ. Pt last seen 05/03/21. Last eye exam 08/25/20. Pt scheduled for follow up on 10/14/21. Per last OV "Continue hydroxychloroquine 300 mg daily." Refilled per standing order protocol.

## 2021-09-28 ENCOUNTER — Encounter: Admit: 2021-09-28 | Discharge: 2021-09-28 | Payer: MEDICARE

## 2021-09-28 DIAGNOSIS — E782 Mixed hyperlipidemia: Secondary | ICD-10-CM

## 2021-09-28 DIAGNOSIS — K219 Gastro-esophageal reflux disease without esophagitis: Secondary | ICD-10-CM

## 2021-09-28 DIAGNOSIS — F32A Depression: Secondary | ICD-10-CM

## 2021-09-28 DIAGNOSIS — I38 Endocarditis, valve unspecified: Secondary | ICD-10-CM

## 2021-09-28 DIAGNOSIS — M5136 Other intervertebral disc degeneration, lumbar region: Secondary | ICD-10-CM

## 2021-09-28 DIAGNOSIS — N3020 Other chronic cystitis without hematuria: Secondary | ICD-10-CM

## 2021-09-28 DIAGNOSIS — Z9109 Other allergy status, other than to drugs and biological substances: Secondary | ICD-10-CM

## 2021-09-28 DIAGNOSIS — M3500 Sicca syndrome, unspecified: Secondary | ICD-10-CM

## 2021-09-28 DIAGNOSIS — I8390 Asymptomatic varicose veins of unspecified lower extremity: Secondary | ICD-10-CM

## 2021-09-28 DIAGNOSIS — E785 Hyperlipidemia, unspecified: Secondary | ICD-10-CM

## 2021-09-28 DIAGNOSIS — M549 Dorsalgia, unspecified: Secondary | ICD-10-CM

## 2021-09-28 DIAGNOSIS — U071 COVID-19: Secondary | ICD-10-CM

## 2021-09-28 DIAGNOSIS — G629 Polyneuropathy, unspecified: Secondary | ICD-10-CM

## 2021-09-28 DIAGNOSIS — E162 Hypoglycemia, unspecified: Secondary | ICD-10-CM

## 2021-09-28 DIAGNOSIS — Z853 Personal history of malignant neoplasm of breast: Secondary | ICD-10-CM

## 2021-09-28 DIAGNOSIS — Z1379 Encounter for other screening for genetic and chromosomal anomalies: Secondary | ICD-10-CM

## 2021-09-28 DIAGNOSIS — E559 Vitamin D deficiency, unspecified: Secondary | ICD-10-CM

## 2021-09-28 DIAGNOSIS — C50212 Malignant neoplasm of upper-inner quadrant of left female breast: Secondary | ICD-10-CM

## 2021-09-28 DIAGNOSIS — M1990 Unspecified osteoarthritis, unspecified site: Secondary | ICD-10-CM

## 2021-09-28 DIAGNOSIS — M2550 Pain in unspecified joint: Secondary | ICD-10-CM

## 2021-09-28 DIAGNOSIS — I10 Essential (primary) hypertension: Secondary | ICD-10-CM

## 2021-09-28 DIAGNOSIS — M810 Age-related osteoporosis without current pathological fracture: Secondary | ICD-10-CM

## 2021-09-28 DIAGNOSIS — C801 Malignant (primary) neoplasm, unspecified: Secondary | ICD-10-CM

## 2021-10-07 ENCOUNTER — Encounter: Admit: 2021-10-07 | Discharge: 2021-10-07 | Payer: MEDICARE

## 2021-10-07 NOTE — Telephone Encounter
Message from patient wanting to know if she should postpone her reclast infusion as she is having left SI joint fusion.    Called patient, informed she should discuss with surgeon, she verbalized understanding.

## 2021-10-08 ENCOUNTER — Encounter: Admit: 2021-10-08 | Discharge: 2021-10-08 | Payer: MEDICARE

## 2021-10-08 NOTE — Telephone Encounter
Patient is asking about driving to doctor appt second day after surgery. Discussed and she decided to reschedule her appt.  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

## 2021-10-11 ENCOUNTER — Encounter: Admit: 2021-10-11 | Discharge: 2021-10-11 | Payer: MEDICARE

## 2021-10-11 DIAGNOSIS — K219 Gastro-esophageal reflux disease without esophagitis: Secondary | ICD-10-CM

## 2021-10-11 DIAGNOSIS — E785 Hyperlipidemia, unspecified: Secondary | ICD-10-CM

## 2021-10-11 DIAGNOSIS — C801 Malignant (primary) neoplasm, unspecified: Secondary | ICD-10-CM

## 2021-10-11 DIAGNOSIS — E559 Vitamin D deficiency, unspecified: Secondary | ICD-10-CM

## 2021-10-11 DIAGNOSIS — C50212 Malignant neoplasm of upper-inner quadrant of left female breast: Secondary | ICD-10-CM

## 2021-10-11 DIAGNOSIS — M810 Age-related osteoporosis without current pathological fracture: Secondary | ICD-10-CM

## 2021-10-11 DIAGNOSIS — G629 Polyneuropathy, unspecified: Secondary | ICD-10-CM

## 2021-10-11 DIAGNOSIS — E162 Hypoglycemia, unspecified: Secondary | ICD-10-CM

## 2021-10-11 DIAGNOSIS — M1990 Unspecified osteoarthritis, unspecified site: Secondary | ICD-10-CM

## 2021-10-11 DIAGNOSIS — U071 COVID-19: Secondary | ICD-10-CM

## 2021-10-11 DIAGNOSIS — M2550 Pain in unspecified joint: Secondary | ICD-10-CM

## 2021-10-11 DIAGNOSIS — N3020 Other chronic cystitis without hematuria: Secondary | ICD-10-CM

## 2021-10-11 DIAGNOSIS — Z9109 Other allergy status, other than to drugs and biological substances: Secondary | ICD-10-CM

## 2021-10-11 DIAGNOSIS — M3500 Sicca syndrome, unspecified: Secondary | ICD-10-CM

## 2021-10-11 DIAGNOSIS — M5136 Other intervertebral disc degeneration, lumbar region: Secondary | ICD-10-CM

## 2021-10-11 DIAGNOSIS — E782 Mixed hyperlipidemia: Secondary | ICD-10-CM

## 2021-10-11 DIAGNOSIS — Z1379 Encounter for other screening for genetic and chromosomal anomalies: Secondary | ICD-10-CM

## 2021-10-11 DIAGNOSIS — Z853 Personal history of malignant neoplasm of breast: Secondary | ICD-10-CM

## 2021-10-11 DIAGNOSIS — I38 Endocarditis, valve unspecified: Secondary | ICD-10-CM

## 2021-10-11 DIAGNOSIS — I8390 Asymptomatic varicose veins of unspecified lower extremity: Secondary | ICD-10-CM

## 2021-10-11 DIAGNOSIS — I10 Essential (primary) hypertension: Secondary | ICD-10-CM

## 2021-10-11 DIAGNOSIS — F32A Depression: Secondary | ICD-10-CM

## 2021-10-11 DIAGNOSIS — M549 Dorsalgia, unspecified: Secondary | ICD-10-CM

## 2021-10-12 ENCOUNTER — Encounter: Admit: 2021-10-12 | Discharge: 2021-10-12 | Payer: MEDICARE

## 2021-10-12 ENCOUNTER — Ambulatory Visit: Admit: 2021-10-12 | Discharge: 2021-10-12 | Payer: MEDICARE

## 2021-10-12 DIAGNOSIS — Z9109 Other allergy status, other than to drugs and biological substances: Secondary | ICD-10-CM

## 2021-10-12 DIAGNOSIS — M2550 Pain in unspecified joint: Secondary | ICD-10-CM

## 2021-10-12 DIAGNOSIS — E785 Hyperlipidemia, unspecified: Secondary | ICD-10-CM

## 2021-10-12 DIAGNOSIS — M533 Sacrococcygeal disorders, not elsewhere classified: Secondary | ICD-10-CM

## 2021-10-12 DIAGNOSIS — M3500 Sicca syndrome, unspecified: Secondary | ICD-10-CM

## 2021-10-12 DIAGNOSIS — G629 Polyneuropathy, unspecified: Secondary | ICD-10-CM

## 2021-10-12 DIAGNOSIS — K219 Gastro-esophageal reflux disease without esophagitis: Secondary | ICD-10-CM

## 2021-10-12 DIAGNOSIS — M810 Age-related osteoporosis without current pathological fracture: Secondary | ICD-10-CM

## 2021-10-12 DIAGNOSIS — C801 Malignant (primary) neoplasm, unspecified: Secondary | ICD-10-CM

## 2021-10-12 DIAGNOSIS — M549 Dorsalgia, unspecified: Secondary | ICD-10-CM

## 2021-10-12 DIAGNOSIS — E782 Mixed hyperlipidemia: Secondary | ICD-10-CM

## 2021-10-12 DIAGNOSIS — I38 Endocarditis, valve unspecified: Secondary | ICD-10-CM

## 2021-10-12 DIAGNOSIS — U071 COVID-19: Secondary | ICD-10-CM

## 2021-10-12 DIAGNOSIS — M1990 Unspecified osteoarthritis, unspecified site: Secondary | ICD-10-CM

## 2021-10-12 DIAGNOSIS — E559 Vitamin D deficiency, unspecified: Secondary | ICD-10-CM

## 2021-10-12 DIAGNOSIS — F32A Depression: Secondary | ICD-10-CM

## 2021-10-12 DIAGNOSIS — C50212 Malignant neoplasm of upper-inner quadrant of left female breast: Secondary | ICD-10-CM

## 2021-10-12 DIAGNOSIS — I10 Essential (primary) hypertension: Secondary | ICD-10-CM

## 2021-10-12 DIAGNOSIS — M5136 Other intervertebral disc degeneration, lumbar region: Secondary | ICD-10-CM

## 2021-10-12 DIAGNOSIS — E162 Hypoglycemia, unspecified: Secondary | ICD-10-CM

## 2021-10-12 DIAGNOSIS — Z1379 Encounter for other screening for genetic and chromosomal anomalies: Secondary | ICD-10-CM

## 2021-10-12 DIAGNOSIS — N3020 Other chronic cystitis without hematuria: Secondary | ICD-10-CM

## 2021-10-12 DIAGNOSIS — Z853 Personal history of malignant neoplasm of breast: Secondary | ICD-10-CM

## 2021-10-12 DIAGNOSIS — I8390 Asymptomatic varicose veins of unspecified lower extremity: Secondary | ICD-10-CM

## 2021-10-12 MED ORDER — FENTANYL CITRATE (PF) 50 MCG/ML IJ SOLN
INTRAVENOUS | 0 refills | Status: DC
Start: 2021-10-12 — End: 2021-10-12

## 2021-10-12 MED ORDER — CEFAZOLIN 1 GRAM IJ SOLR
INTRAVENOUS | 0 refills | Status: DC
Start: 2021-10-12 — End: 2021-10-12

## 2021-10-12 MED ORDER — MIDAZOLAM 1 MG/ML IJ SOLN
INTRAVENOUS | 0 refills | Status: DC
Start: 2021-10-12 — End: 2021-10-12

## 2021-10-12 MED ORDER — EPHEDRINE SULFATE 50 MG/ML IV SOLN
INTRAVENOUS | 0 refills | Status: DC
Start: 2021-10-12 — End: 2021-10-12

## 2021-10-12 MED ORDER — PHENYLEPHRINE HCL 10 MG/ML IJ SOLN
INTRAVENOUS | 0 refills | Status: DC
Start: 2021-10-12 — End: 2021-10-12

## 2021-10-12 MED ORDER — DEXMEDETOMIDINE IN 0.9 % NACL 80 MCG/20 ML (4 MCG/ML) IV SOLN
INTRAVENOUS | 0 refills | Status: DC
Start: 2021-10-12 — End: 2021-10-12

## 2021-10-12 MED ORDER — PROPOFOL 10 MG/ML IV EMUL 50 ML (INFUSION)(AM)(OR)
INTRAVENOUS | 0 refills | Status: DC
Start: 2021-10-12 — End: 2021-10-12

## 2021-10-12 MED ORDER — LIDOCAINE (PF) 100 MG/5 ML (2 %) IV SYRG
INTRAVENOUS | 0 refills | Status: DC
Start: 2021-10-12 — End: 2021-10-12

## 2021-10-12 MED FILL — HYDROCODONE-ACETAMINOPHEN 5-325 MG PO TAB: 5/325 mg | ORAL | 5 days supply | Qty: 20 | Fill #1 | Status: CP

## 2021-10-13 ENCOUNTER — Encounter: Admit: 2021-10-13 | Discharge: 2021-10-13 | Payer: MEDICARE

## 2021-10-13 DIAGNOSIS — U071 COVID-19: Secondary | ICD-10-CM

## 2021-10-13 DIAGNOSIS — E162 Hypoglycemia, unspecified: Secondary | ICD-10-CM

## 2021-10-13 DIAGNOSIS — M3500 Sicca syndrome, unspecified: Secondary | ICD-10-CM

## 2021-10-13 DIAGNOSIS — M810 Age-related osteoporosis without current pathological fracture: Secondary | ICD-10-CM

## 2021-10-13 DIAGNOSIS — Z853 Personal history of malignant neoplasm of breast: Secondary | ICD-10-CM

## 2021-10-13 DIAGNOSIS — E559 Vitamin D deficiency, unspecified: Secondary | ICD-10-CM

## 2021-10-13 DIAGNOSIS — C50212 Malignant neoplasm of upper-inner quadrant of left female breast: Secondary | ICD-10-CM

## 2021-10-13 DIAGNOSIS — G629 Polyneuropathy, unspecified: Secondary | ICD-10-CM

## 2021-10-13 DIAGNOSIS — I38 Endocarditis, valve unspecified: Secondary | ICD-10-CM

## 2021-10-13 DIAGNOSIS — I8390 Asymptomatic varicose veins of unspecified lower extremity: Secondary | ICD-10-CM

## 2021-10-13 DIAGNOSIS — N3020 Other chronic cystitis without hematuria: Secondary | ICD-10-CM

## 2021-10-13 DIAGNOSIS — M549 Dorsalgia, unspecified: Secondary | ICD-10-CM

## 2021-10-13 DIAGNOSIS — E782 Mixed hyperlipidemia: Secondary | ICD-10-CM

## 2021-10-13 DIAGNOSIS — C801 Malignant (primary) neoplasm, unspecified: Secondary | ICD-10-CM

## 2021-10-13 DIAGNOSIS — Z9109 Other allergy status, other than to drugs and biological substances: Secondary | ICD-10-CM

## 2021-10-13 DIAGNOSIS — E785 Hyperlipidemia, unspecified: Secondary | ICD-10-CM

## 2021-10-13 DIAGNOSIS — I10 Essential (primary) hypertension: Secondary | ICD-10-CM

## 2021-10-13 DIAGNOSIS — M2550 Pain in unspecified joint: Secondary | ICD-10-CM

## 2021-10-13 DIAGNOSIS — F32A Depression: Secondary | ICD-10-CM

## 2021-10-13 DIAGNOSIS — Z1379 Encounter for other screening for genetic and chromosomal anomalies: Secondary | ICD-10-CM

## 2021-10-13 DIAGNOSIS — M5136 Other intervertebral disc degeneration, lumbar region: Secondary | ICD-10-CM

## 2021-10-13 DIAGNOSIS — K219 Gastro-esophageal reflux disease without esophagitis: Secondary | ICD-10-CM

## 2021-10-13 DIAGNOSIS — M1990 Unspecified osteoarthritis, unspecified site: Secondary | ICD-10-CM

## 2021-10-18 ENCOUNTER — Encounter: Admit: 2021-10-18 | Discharge: 2021-10-18 | Payer: MEDICARE

## 2021-10-20 ENCOUNTER — Encounter: Admit: 2021-10-20 | Discharge: 2021-10-20 | Payer: MEDICARE

## 2021-10-20 ENCOUNTER — Ambulatory Visit: Admit: 2021-10-20 | Discharge: 2021-10-20 | Payer: MEDICARE

## 2021-10-20 VITALS — BP 137/71 | HR 63 | Ht 62.5 in | Wt 140.0 lb

## 2021-10-20 DIAGNOSIS — U071 COVID-19: Secondary | ICD-10-CM

## 2021-10-20 DIAGNOSIS — M2550 Pain in unspecified joint: Secondary | ICD-10-CM

## 2021-10-20 DIAGNOSIS — M549 Dorsalgia, unspecified: Secondary | ICD-10-CM

## 2021-10-20 DIAGNOSIS — N3020 Other chronic cystitis without hematuria: Secondary | ICD-10-CM

## 2021-10-20 DIAGNOSIS — E559 Vitamin D deficiency, unspecified: Secondary | ICD-10-CM

## 2021-10-20 DIAGNOSIS — E162 Hypoglycemia, unspecified: Secondary | ICD-10-CM

## 2021-10-20 DIAGNOSIS — M461 Sacroiliitis, not elsewhere classified: Secondary | ICD-10-CM

## 2021-10-20 DIAGNOSIS — M533 Sacrococcygeal disorders, not elsewhere classified: Principal | ICD-10-CM

## 2021-10-20 DIAGNOSIS — I8390 Asymptomatic varicose veins of unspecified lower extremity: Secondary | ICD-10-CM

## 2021-10-20 DIAGNOSIS — M5136 Other intervertebral disc degeneration, lumbar region: Secondary | ICD-10-CM

## 2021-10-20 DIAGNOSIS — I38 Endocarditis, valve unspecified: Secondary | ICD-10-CM

## 2021-10-20 DIAGNOSIS — F32A Depression: Secondary | ICD-10-CM

## 2021-10-20 DIAGNOSIS — M3500 Sicca syndrome, unspecified: Secondary | ICD-10-CM

## 2021-10-20 DIAGNOSIS — M1990 Unspecified osteoarthritis, unspecified site: Secondary | ICD-10-CM

## 2021-10-20 DIAGNOSIS — C50212 Malignant neoplasm of upper-inner quadrant of left female breast: Secondary | ICD-10-CM

## 2021-10-20 DIAGNOSIS — K219 Gastro-esophageal reflux disease without esophagitis: Secondary | ICD-10-CM

## 2021-10-20 DIAGNOSIS — Z1379 Encounter for other screening for genetic and chromosomal anomalies: Secondary | ICD-10-CM

## 2021-10-20 DIAGNOSIS — E785 Hyperlipidemia, unspecified: Secondary | ICD-10-CM

## 2021-10-20 DIAGNOSIS — I10 Essential (primary) hypertension: Secondary | ICD-10-CM

## 2021-10-20 DIAGNOSIS — Z853 Personal history of malignant neoplasm of breast: Secondary | ICD-10-CM

## 2021-10-20 DIAGNOSIS — G629 Polyneuropathy, unspecified: Secondary | ICD-10-CM

## 2021-10-20 DIAGNOSIS — C801 Malignant (primary) neoplasm, unspecified: Secondary | ICD-10-CM

## 2021-10-20 DIAGNOSIS — Z9109 Other allergy status, other than to drugs and biological substances: Secondary | ICD-10-CM

## 2021-10-20 DIAGNOSIS — M810 Age-related osteoporosis without current pathological fracture: Secondary | ICD-10-CM

## 2021-10-20 DIAGNOSIS — E782 Mixed hyperlipidemia: Secondary | ICD-10-CM

## 2021-10-20 MED ORDER — DULOXETINE 60 MG PO CPDR
60 mg | ORAL_CAPSULE | Freq: Every day | ORAL | 0 refills | 60.00000 days | Status: AC
Start: 2021-10-20 — End: ?
  Filled 2021-10-20: qty 90, 90d supply, fill #1

## 2021-10-20 MED ORDER — HYDROCODONE-ACETAMINOPHEN 5-325 MG PO TAB
1 | ORAL_TABLET | Freq: Two times a day (BID) | ORAL | 0 refills | Status: CN | PRN
Start: 2021-10-20 — End: ?

## 2021-10-20 MED FILL — HYDROCODONE-ACETAMINOPHEN 5-325 MG PO TAB: 5/325 mg | ORAL | 10 days supply | Qty: 20 | Fill #1 | Status: CP

## 2021-10-20 NOTE — Progress Notes
SPINE CENTER CLINIC NOTE  Subjective     SUBJECTIVE:   Follow up after SCS implant     Manufacturer: Other  Post-op day: 8    Surgery Date: 10/12/2021  Side effects/complications: none  Patient comments:     Patient reports moderate incisional pain, yet well managed with postop opioid therapy.  Patient has been taking Norco 3 tab a day. She states she will occasionally have sharp pains, but otherwise well managed. She states she is almost out of Norco and requesting refill.     Denies fevers or incisional drainage or swelling. +chills at times    Denies changes in BLE strength.  Denies loss of bowel/bladder function. +BM since surgery            Review of Systems    Current Outpatient Medications:   ?  atorvastatin (LIPITOR) 20 mg tablet, Take 1 tablet by mouth once daily, Disp: 90 tablet, Rfl: 3  ?  benzonatate (TESSALON PERLES) 100 mg capsule, Take one capsule by mouth every 8 hours as needed for Cough., Disp: , Rfl:   ?  CALCIUM CARBONATE (CALCIUM 500 PO), Take 1 Cap by mouth Daily., Disp: , Rfl:   ?  Cholecalciferol (VITAMIN D3) 125 mcg (5,000 unit) capsule, Take two capsules by mouth daily., Disp: , Rfl:   ?  duloxetine DR (CYMBALTA) 60 mg capsule, Take one capsule by mouth daily., Disp: 90 capsule, Rfl: 0  ?  flecainide (TAMBOCOR) 50 mg tablet, Take 1 tablet by mouth twice daily, Disp: 60 tablet, Rfl: 11  ?  HYDROcodone/acetaminophen (NORCO) 5/325 mg tablet, Take one tablet by mouth every 12 hours as needed for Pain., Disp: 20 tablet, Rfl: 0  ?  hydrOXYchloroQUINE (PLAQUENIL) 200 mg tablet, Take 1 tablet by mouth twice daily with food, Disp: 180 tablet, Rfl: 0  ?  hyoscyamine sulfate (LEVSIN/SL) 0.125 mg sublingual tablet, Place one tablet under tongue every 4 hours as needed. Indications: Bladder spasms, Disp: 30 tablet, Rfl: 1  ?  metoprolol succinate XL (TOPROL XL) 50 mg extended release tablet, Take 1 tablet by mouth once daily, Disp: 90 tablet, Rfl: 3  ?  oxybutynin XL (DITROPAN XL) 10 mg tablet, TAKE 1 TABLET BY MOUTH ONCE DAILY DO  NOT  CUT  CRUSH  OR  CHEW, Disp: 90 tablet, Rfl: 3  ?  polyethylene glycol 3350 (MIRALAX) 17 gram/dose powder, Take seventeen g by mouth daily., Disp: 527 g, Rfl: 1  ?  traMADoL (ULTRAM) 50 mg tablet, Take one tablet by mouth every 4 hours as needed for Pain. Indications: neuropathic pain, pain, Disp: 30 tablet, Rfl: 0  Allergies   Allergen Reactions   ? Sulfa (Sulfonamide Antibiotics) RASH and NAUSEA AND VOMITING     Allergy recorded in SMS: Sulfa       Physical Exam  General: Alert, oriented, no acute distress  Resp: Non labored breathing, no distress  Skin: incisions healing well, well approximated, no sx of infection, drainage, erythema or swelling noted.        Vitals:    10/20/21 1145   BP: 137/71   BP Source: Arm, Right Upper   Pulse: 63   SpO2: 94%   PainSc: Ten   Weight: 63.5 kg (140 lb)   Height: 158.8 cm (5' 2.5)     Oswestry Total Score:: 32  Pain Score: Ten  Body mass index is 25.2 kg/m?Marland Kitchen           IMPRESSION:  1. Sacroiliac dysfunction  2. Sacro-iliac pain    3. Sacroiliitis (HCC)          PLAN:   Dressing change, staples (s) removed without difficulty  Mastisol, steri-strips, gauze and tegaderm applied over each incision.   Pt instructed to keep incision dry, may begin showers in 2 days, but no bathing or submerging for at least 3 weeks  Encouraged to continue activity restriction to no heavy lifting greater than 10lbs, limit twisting and excessive bending of the spine for 4 weeks.   Will provide one time refill Norco 5/325mg  1 tab q12h PRN #20 for postop pain, discussed this will not be continued long term and recommend slow taper of use. Discussed risks and benefits of medication therapy and possible SEs, patient requesting okay to drive due to greatgrandson care - instructed okay to drive as long as > 6-8 hours of opioid use, patient verbalized understanding and all questions answered.   Follow up in 3 weeks for wound check and stim eval.

## 2021-10-29 ENCOUNTER — Encounter: Admit: 2021-10-29 | Discharge: 2021-10-29 | Payer: MEDICARE

## 2021-11-02 ENCOUNTER — Encounter: Admit: 2021-11-02 | Discharge: 2021-11-02 | Payer: MEDICARE

## 2021-11-02 MED ORDER — ATORVASTATIN 20 MG PO TAB
ORAL_TABLET | 0 refills
Start: 2021-11-02 — End: ?

## 2021-11-04 ENCOUNTER — Ambulatory Visit: Admit: 2021-11-04 | Discharge: 2021-11-05 | Payer: MEDICARE

## 2021-11-04 ENCOUNTER — Encounter: Admit: 2021-11-04 | Discharge: 2021-11-04 | Payer: MEDICARE

## 2021-11-04 DIAGNOSIS — M810 Age-related osteoporosis without current pathological fracture: Secondary | ICD-10-CM

## 2021-11-04 MED ORDER — ZOLEDRONIC ACID-MANNITOL-WATER 5 MG/100 ML IV PGBK
5 mg | Freq: Once | INTRAVENOUS | 0 refills | Status: CN
Start: 2021-11-04 — End: ?

## 2021-11-04 MED ORDER — ZOLEDRONIC ACID-MANNITOL-WATER 5 MG/100 ML IV PGBK
5 mg | Freq: Once | INTRAVENOUS | 0 refills | Status: CP
Start: 2021-11-04 — End: ?
  Administered 2021-11-04: 17:00:00 5 mg via INTRAVENOUS

## 2021-11-04 NOTE — Progress Notes
Patient received and tolerated Reclast infusion after iCa and Creatinine tests were done; no reaction noted.

## 2021-11-05 ENCOUNTER — Encounter: Admit: 2021-11-05 | Discharge: 2021-11-05 | Payer: MEDICARE

## 2021-11-05 DIAGNOSIS — M818 Other osteoporosis without current pathological fracture: Secondary | ICD-10-CM

## 2021-11-10 ENCOUNTER — Ambulatory Visit: Admit: 2021-11-10 | Discharge: 2021-11-10 | Payer: MEDICARE

## 2021-11-10 ENCOUNTER — Encounter: Admit: 2021-11-10 | Discharge: 2021-11-10 | Payer: MEDICARE

## 2021-11-10 DIAGNOSIS — R454 Irritability and anger: Secondary | ICD-10-CM

## 2021-11-10 DIAGNOSIS — M5137 Other intervertebral disc degeneration, lumbosacral region: Secondary | ICD-10-CM

## 2021-11-10 DIAGNOSIS — M533 Sacrococcygeal disorders, not elsewhere classified: Secondary | ICD-10-CM

## 2021-11-12 ENCOUNTER — Encounter: Admit: 2021-11-12 | Discharge: 2021-11-12 | Payer: MEDICARE

## 2021-11-12 ENCOUNTER — Ambulatory Visit: Admit: 2021-11-12 | Discharge: 2021-11-12 | Payer: MEDICARE

## 2021-11-12 DIAGNOSIS — Z1379 Encounter for other screening for genetic and chromosomal anomalies: Secondary | ICD-10-CM

## 2021-11-12 DIAGNOSIS — M810 Age-related osteoporosis without current pathological fracture: Secondary | ICD-10-CM

## 2021-11-12 DIAGNOSIS — Z853 Personal history of malignant neoplasm of breast: Secondary | ICD-10-CM

## 2021-11-12 DIAGNOSIS — E782 Mixed hyperlipidemia: Secondary | ICD-10-CM

## 2021-11-12 DIAGNOSIS — M5136 Other intervertebral disc degeneration, lumbar region: Secondary | ICD-10-CM

## 2021-11-12 DIAGNOSIS — G629 Polyneuropathy, unspecified: Secondary | ICD-10-CM

## 2021-11-12 DIAGNOSIS — I8390 Asymptomatic varicose veins of unspecified lower extremity: Secondary | ICD-10-CM

## 2021-11-12 DIAGNOSIS — M3500 Sicca syndrome, unspecified: Secondary | ICD-10-CM

## 2021-11-12 DIAGNOSIS — I10 Essential (primary) hypertension: Secondary | ICD-10-CM

## 2021-11-12 DIAGNOSIS — U071 COVID-19: Secondary | ICD-10-CM

## 2021-11-12 DIAGNOSIS — E785 Hyperlipidemia, unspecified: Secondary | ICD-10-CM

## 2021-11-12 DIAGNOSIS — I38 Endocarditis, valve unspecified: Secondary | ICD-10-CM

## 2021-11-12 DIAGNOSIS — E162 Hypoglycemia, unspecified: Secondary | ICD-10-CM

## 2021-11-12 DIAGNOSIS — C50212 Malignant neoplasm of upper-inner quadrant of left female breast: Secondary | ICD-10-CM

## 2021-11-12 DIAGNOSIS — C801 Malignant (primary) neoplasm, unspecified: Secondary | ICD-10-CM

## 2021-11-12 DIAGNOSIS — M1990 Unspecified osteoarthritis, unspecified site: Secondary | ICD-10-CM

## 2021-11-12 DIAGNOSIS — Z9109 Other allergy status, other than to drugs and biological substances: Secondary | ICD-10-CM

## 2021-11-12 DIAGNOSIS — K219 Gastro-esophageal reflux disease without esophagitis: Secondary | ICD-10-CM

## 2021-11-12 DIAGNOSIS — F32A Depression: Secondary | ICD-10-CM

## 2021-11-12 DIAGNOSIS — M549 Dorsalgia, unspecified: Secondary | ICD-10-CM

## 2021-11-12 DIAGNOSIS — M2550 Pain in unspecified joint: Secondary | ICD-10-CM

## 2021-11-12 DIAGNOSIS — N3020 Other chronic cystitis without hematuria: Secondary | ICD-10-CM

## 2021-11-12 DIAGNOSIS — E559 Vitamin D deficiency, unspecified: Secondary | ICD-10-CM

## 2021-12-10 ENCOUNTER — Encounter: Admit: 2021-12-10 | Discharge: 2021-12-10 | Payer: MEDICARE

## 2021-12-10 MED ORDER — HYDROXYCHLOROQUINE 200 MG PO TAB
ORAL_TABLET | 0 refills
Start: 2021-12-10 — End: ?

## 2021-12-10 NOTE — Telephone Encounter
Refill of hcq requested. Patient last seen 05/03/21 and scheduled for follow-up 05/02/22.    Per LOV notes, "Plan:  Start Evoxac twice daily  Proceed with reflux in September  Sjogren's activity labs  Continue hydroxychloroquine 300 mg daily     I will see her back in about 6 months    Dola Factor, MD"    Last eye exam July 2022, mychart message sent to patient.

## 2021-12-15 ENCOUNTER — Encounter: Admit: 2021-12-15 | Discharge: 2021-12-15 | Payer: MEDICARE

## 2021-12-15 ENCOUNTER — Ambulatory Visit: Admit: 2021-12-15 | Discharge: 2021-12-15 | Payer: MEDICARE

## 2021-12-15 DIAGNOSIS — E785 Hyperlipidemia, unspecified: Secondary | ICD-10-CM

## 2021-12-15 DIAGNOSIS — Z1379 Encounter for other screening for genetic and chromosomal anomalies: Secondary | ICD-10-CM

## 2021-12-15 DIAGNOSIS — N3020 Other chronic cystitis without hematuria: Secondary | ICD-10-CM

## 2021-12-15 DIAGNOSIS — E559 Vitamin D deficiency, unspecified: Secondary | ICD-10-CM

## 2021-12-15 DIAGNOSIS — M1990 Unspecified osteoarthritis, unspecified site: Secondary | ICD-10-CM

## 2021-12-15 DIAGNOSIS — F32A Depression: Secondary | ICD-10-CM

## 2021-12-15 DIAGNOSIS — M2550 Pain in unspecified joint: Secondary | ICD-10-CM

## 2021-12-15 DIAGNOSIS — M533 Sacrococcygeal disorders, not elsewhere classified: Secondary | ICD-10-CM

## 2021-12-15 DIAGNOSIS — G629 Polyneuropathy, unspecified: Secondary | ICD-10-CM

## 2021-12-15 DIAGNOSIS — Z9109 Other allergy status, other than to drugs and biological substances: Secondary | ICD-10-CM

## 2021-12-15 DIAGNOSIS — I10 Essential (primary) hypertension: Secondary | ICD-10-CM

## 2021-12-15 DIAGNOSIS — I38 Endocarditis, valve unspecified: Secondary | ICD-10-CM

## 2021-12-15 DIAGNOSIS — I8390 Asymptomatic varicose veins of unspecified lower extremity: Secondary | ICD-10-CM

## 2021-12-15 DIAGNOSIS — E162 Hypoglycemia, unspecified: Secondary | ICD-10-CM

## 2021-12-15 DIAGNOSIS — U071 COVID-19: Secondary | ICD-10-CM

## 2021-12-15 DIAGNOSIS — Z853 Personal history of malignant neoplasm of breast: Secondary | ICD-10-CM

## 2021-12-15 DIAGNOSIS — E782 Mixed hyperlipidemia: Secondary | ICD-10-CM

## 2021-12-15 DIAGNOSIS — C50212 Malignant neoplasm of upper-inner quadrant of left female breast: Secondary | ICD-10-CM

## 2021-12-15 DIAGNOSIS — M3500 Sicca syndrome, unspecified: Secondary | ICD-10-CM

## 2021-12-15 DIAGNOSIS — M810 Age-related osteoporosis without current pathological fracture: Secondary | ICD-10-CM

## 2021-12-15 DIAGNOSIS — C801 Malignant (primary) neoplasm, unspecified: Secondary | ICD-10-CM

## 2021-12-15 DIAGNOSIS — K219 Gastro-esophageal reflux disease without esophagitis: Secondary | ICD-10-CM

## 2021-12-15 DIAGNOSIS — M549 Dorsalgia, unspecified: Secondary | ICD-10-CM

## 2021-12-15 DIAGNOSIS — M5136 Other intervertebral disc degeneration, lumbar region: Secondary | ICD-10-CM

## 2021-12-15 NOTE — Progress Notes
[facility]INE [facility]ENTER CLINIC NOTE       SUBJECTIVE:   Kristen Norris is a 71 y.o. 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      Chronic SI joint, back pain   Post op SI LINQ fusion on 09/05/202  2 mo post op  ?  Overall feels better   Has been more functional   Able to do PT without use of her cane   She continues PT and would like to get new orders for this   Has been able to improve her balance some and no longer using cane     Pulling and pushing aggravate the pain   Sitting in care and standing long can aggravate the pain   Overall feels she has been better   Incisions healing   Mild soreness to SI joint site    Her moods have improved   She thinks this was related to the pain meds    Has stopped HCD    ?  MEDS   Stopped HCD   Duloxetine - switched to day time dosing  Melatonin 3mg  at HS helps (tried 10mg  and this was too much)   Flexeril from her dentist but has not needed   ?         Review of Systems    Current Outpatient Medications:   ?  atorvastatin (LIPITOR) 20 mg tablet, Take 1 tablet by mouth once daily, Disp: 90 tablet, Rfl: 3  ?  benzonatate (TESSALON PERLES) 100 mg capsule, Take one capsule by mouth every 8 hours as needed for Cough., Disp: , Rfl: ?  CALCIUM CARBONATE (CALCIUM 500 PO), Take 1 Cap by mouth Daily., Disp: , Rfl:   ?  Cholecalciferol (VITAMIN D3) 125 mcg (5,000 unit) capsule, Take two capsules by mouth daily., Disp: , Rfl:   ?  cyclobenzaprine (FLEXERIL) 5 mg tablet, , Disp: , Rfl:   ?  duloxetine DR (CYMBALTA) 60 mg capsule, Take one capsule by mouth daily., Disp: 90 capsule, Rfl: 0  ?  flecainide (TAMBOCOR) 50 mg tablet, Take 1 tablet by mouth twice daily, Disp: 60 tablet, Rfl: 11  ?  HYDROcodone/acetaminophen (NORCO) 5/325 mg tablet, Take one tablet by mouth every 12 hours as needed for Pain., Disp: 20 tablet, Rfl: 0  ?  hydrOXYchloroQUINE (PLAQUENIL) 200 mg tablet, Take 1 tablet by mouth twice daily with food, Disp: 180 tablet, Rfl: 0  ?  hyoscyamine sulfate (LEVSIN/SL) 0.125 mg sublingual tablet, Place one tablet under tongue every 4 hours as needed. Indications: Bladder spasms, Disp: 30 tablet, Rfl: 1  ?  metoprolol succinate XL (TOPROL XL) 50 mg extended release tablet, Take 1 tablet by mouth once daily, Disp: 90 tablet, Rfl: 3  ?  oxybutynin XL (DITROPAN XL) 10 mg tablet, TAKE 1 TABLET BY MOUTH ONCE DAILY DO  NOT  CUT  CRUSH  OR  CHEW, Disp: 90 tablet, Rfl: 3  ?  polyethylene glycol 3350 (MIRALAX)  17 gram/dose powder, Take seventeen g by mouth daily., Disp: 527 g, Rfl: 1  ?  traMADoL (ULTRAM) 50 mg tablet, Take one tablet by mouth every 4 hours as needed for Pain. Indications: neuropathic pain, pain, Disp: 30 tablet, Rfl: 0  Allergies   Allergen Reactions   ? Sulfa (Sulfonamide Antibiotics) RASH and NAUSEA AND VOMITING     Allergy recorded in SMS: Sulfa     Physical Exam  Vitals:    12/15/21 1244   BP: 132/78   BP Source: Arm, Left Upper   Pulse: 92   SpO2: 100%   PainSc: Two   Weight: 65.7 kg (144 lb 12.8 oz)   Height: 158.8 cm (5' 2.5)     Oswestry Total Score:: 28  Pain Score: Two  Body mass index is 26.06 kg/m?Marland Kitchen    General: Alert, oriented, mild distress  HEENT: normocephalic  NECK: supple  Resp: Non labored breathing, no distress  Cardio: Pedal pulses palpable bilaterally, no lower extremity edema  MS: TTP in lumbar/sacral region of spine and paraspinal muscles.  NEURO: Cranial nerves II-XII intact. Motor strength adequate, sensory exams normal. Gait coordinated.   Behavior: Calm, cooperative, behavior and speech normal.        IMPRESSION:  1. Sacroiliac joint dysfunction        PLAN:  Encouraged strengthening, stretching and conservative therapies  Continue medication as prescribed   PT order provided for continuation   Discussed med side effects, risks  no aberrant behavior.   8 week f.u or sooner as needed

## 2022-02-05 ENCOUNTER — Encounter: Admit: 2022-02-05 | Discharge: 2022-02-05 | Payer: MEDICARE

## 2022-02-05 MED ORDER — HYDROXYCHLOROQUINE 200 MG PO TAB
ORAL_TABLET | 0 refills
Start: 2022-02-05 — End: ?

## 2022-03-09 ENCOUNTER — Encounter: Admit: 2022-03-09 | Discharge: 2022-03-09 | Payer: MEDICARE

## 2022-03-09 MED ORDER — DULOXETINE 60 MG PO CPDR
60 mg | ORAL_CAPSULE | Freq: Every day | ORAL | 0 refills | Status: CN
Start: 2022-03-09 — End: ?

## 2022-03-09 NOTE — Telephone Encounter
Last OFV with Zohra, NP 12/15/21. Cont as prescribed. Refilled per provider protocol.

## 2022-03-14 ENCOUNTER — Encounter: Admit: 2022-03-14 | Discharge: 2022-03-14 | Payer: MEDICARE

## 2022-03-15 ENCOUNTER — Encounter: Admit: 2022-03-15 | Discharge: 2022-03-15 | Payer: MEDICARE

## 2022-03-15 DIAGNOSIS — M3500 Sicca syndrome, unspecified: Secondary | ICD-10-CM

## 2022-03-15 DIAGNOSIS — C801 Malignant (primary) neoplasm, unspecified: Secondary | ICD-10-CM

## 2022-03-15 DIAGNOSIS — U071 COVID-19: Secondary | ICD-10-CM

## 2022-03-15 DIAGNOSIS — G629 Polyneuropathy, unspecified: Secondary | ICD-10-CM

## 2022-03-15 DIAGNOSIS — I10 Essential (primary) hypertension: Secondary | ICD-10-CM

## 2022-03-15 DIAGNOSIS — Z1379 Encounter for other screening for genetic and chromosomal anomalies: Secondary | ICD-10-CM

## 2022-03-15 DIAGNOSIS — N3020 Other chronic cystitis without hematuria: Secondary | ICD-10-CM

## 2022-03-15 DIAGNOSIS — E782 Mixed hyperlipidemia: Secondary | ICD-10-CM

## 2022-03-15 DIAGNOSIS — F32A Depression: Secondary | ICD-10-CM

## 2022-03-15 DIAGNOSIS — Z9109 Other allergy status, other than to drugs and biological substances: Secondary | ICD-10-CM

## 2022-03-15 DIAGNOSIS — M810 Age-related osteoporosis without current pathological fracture: Secondary | ICD-10-CM

## 2022-03-15 DIAGNOSIS — M2550 Pain in unspecified joint: Secondary | ICD-10-CM

## 2022-03-15 DIAGNOSIS — I38 Endocarditis, valve unspecified: Secondary | ICD-10-CM

## 2022-03-15 DIAGNOSIS — C50212 Malignant neoplasm of upper-inner quadrant of left female breast: Secondary | ICD-10-CM

## 2022-03-15 DIAGNOSIS — H8193 Unspecified disorder of vestibular function, bilateral: Secondary | ICD-10-CM

## 2022-03-15 DIAGNOSIS — M549 Dorsalgia, unspecified: Secondary | ICD-10-CM

## 2022-03-15 DIAGNOSIS — K219 Gastro-esophageal reflux disease without esophagitis: Secondary | ICD-10-CM

## 2022-03-15 DIAGNOSIS — M1990 Unspecified osteoarthritis, unspecified site: Secondary | ICD-10-CM

## 2022-03-15 DIAGNOSIS — R42 Dizziness and giddiness: Secondary | ICD-10-CM

## 2022-03-15 DIAGNOSIS — E559 Vitamin D deficiency, unspecified: Secondary | ICD-10-CM

## 2022-03-15 DIAGNOSIS — M5136 Other intervertebral disc degeneration, lumbar region: Secondary | ICD-10-CM

## 2022-03-15 DIAGNOSIS — G909 Disorder of the autonomic nervous system, unspecified: Secondary | ICD-10-CM

## 2022-03-15 DIAGNOSIS — E162 Hypoglycemia, unspecified: Secondary | ICD-10-CM

## 2022-03-15 DIAGNOSIS — I471 PSVT (paroxysmal supraventricular tachycardia): Secondary | ICD-10-CM

## 2022-03-15 DIAGNOSIS — Z136 Encounter for screening for cardiovascular disorders: Secondary | ICD-10-CM

## 2022-03-15 DIAGNOSIS — Z853 Personal history of malignant neoplasm of breast: Secondary | ICD-10-CM

## 2022-03-15 DIAGNOSIS — E785 Hyperlipidemia, unspecified: Secondary | ICD-10-CM

## 2022-03-15 DIAGNOSIS — I447 Left bundle-branch block, unspecified: Secondary | ICD-10-CM

## 2022-03-15 DIAGNOSIS — I8390 Asymptomatic varicose veins of unspecified lower extremity: Secondary | ICD-10-CM

## 2022-03-15 NOTE — Progress Notes
Date of Service: 03/15/2022    Kristen Norris is a 72 y.o. female.       HPI     Adona was in the [facility] today for follow-up regarding left bundle branch block, PSVT, and autonomic dysfunction.  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  She adopted her 51-year-old grandson last summer and is raising him.  Her daughter has stage IV ovarian carcinoma and she is spending the nights at her daughter's home helping with her care.    She is under a great deal of mental stress and with this she has had a bit of a flare of palpitation symptoms.    She is also noticed some intermittent vertigo that seems to be brought on by rapid head movement.    She has longstanding autonomic dysfunction and still has days when her blood pressure is low and she feels a little bit lightheaded.       Vitals:    03/15/22 1542   BP: 138/74   BP Source: Arm, Left Upper   Pulse: 78   SpO2: 95%   O2 Device: None (Room air)   PainSc: Four   Weight: 66.5 kg (146 lb 9.6 oz)   Height: 158.8 cm (5' 2.5)     Body mass index is 26.39 kg/m?Marland Kitchen     Past Medical History  Patient Active Problem List    Diagnosis Date Noted    PSVT (paroxysmal supraventricular tachycardia) 03/15/2022    Vestibular dysfunction of both ears 03/15/2022    Chronic left-sided low back pain with left-sided sciatica 05/08/2021    Prolapse of female pelvic organs 11/13/2020    Other osteoporosis without current pathological fracture 10/19/2020    Soft tissue mass 08/18/2020    Rectocele, female 06/03/2020     06/03/20 - pt has 2-3+ rectocele, more prominent on standing with symptoms of urinary incontinence.      Frequent urinary incontinence 06/03/2020    Autonomic dysfunction 06/06/2019    Other neutropenia (HCC) 02/26/2019    PSVT (paroxysmal supraventricular tachycardia) 11/01/2018     09/2018 - Intermittent near syncope.  2-week cardiac monitor shows episodes of fast SVT 3-20 seconds in duration.      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 02/23/2018    Arthralgia of right temporomandibular joint 02/23/2018    S/P hysterectomy with oophorectomy 07/15/2011    Pelvic pain in female 07/15/2011    Cystocele 07/15/2011    Left bundle branch block 03/17/2011    Preoperative evaluation to rule out surgical contraindication 03/17/2011    Abnormal electro-oculogram 03/17/2011    Ovarian cyst, left 01/05/2011    Degeneration of lumbar or lumbosacral intervertebral disc 04/18/2010    Thoracic or lumbosacral neuritis or radiculitis, unspecified 04/18/2010    S/P Lumpectomy of Breast-Surgical Oncology Notes 12/17/2007     DIAGNOSIS:  Left breast Invasive Ductal Cancer diagnosed 3/07,  STAGE II-A  T2 N0 M0  Markers:  ER 100%, PR 0%, Ki67 15%, HER-2/neu negative.     PROCEDURE: Left lumpectomy with sentinel node biopsy 06/07/2005    HPI: This 73 year old postmenopausal female developed pain in the medial side of the left chest wall in 2/07.  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 She underwent left lumpectomy and sentinel lymph node sampling on 06/07/05. Lumpectomy revealed a 2.1 x 2-cm invasive  ductal carcinoma, grade II. No angiolymphatic invasion. There was perineural invasion. 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 Margins were clear. 0/2 sentinel lymph nodes were involved.   Oncotype DX was sent and came back with a high recurrence score of 33, which corresponds to a 10-year rate of distant recurrence of 23% with adjuvant tamoxifen alone. For adjuvant chemotherapy, she received four cycles of dose-dense AC and 10 weeks of taxol by Dr. Osborn Coho.  . Taxol had to be stopped at 10 weeks due to grade 3 peripheral neuropathy. She was placed on adjuvant Arimidex in 10/07.  Radiation therapy was given by Dr. Joycelyn Man in [place], [place] and completed in December 2007.   She was last seen in 12/2007 with c/o left axillary burning.  On exam there was a stable scar retraction at left lumpectomy site in left UIQ. No palpable mass, no nipple or areolar change. Right breast: palpable fullness with tenderness right UIQ most consistent with prominent pectoral muscle (patient works heavy labor as gardener). Left diagnostic mammogram 11/09 was class 2. Right breast with palpable tender fullness right upper breast/chest wall, benign clinically. Right breast sonogram also negative 11/09.  Follow-up exam 03/2008 assessment:  Right breast pain- negative clinical exam as well as negative Ultrasound. Findings and complaint most consistent with musculoskeletal etiology (patient works in Aeronautical engineer).  She now returns for a 3 month f/u exam to confirm this impression.           GERD (gastroesophageal reflux disease)     Depression     Environmental allergies     PN (Peripheral Neuropathy)     Hypoglycemia     Osteoporosis      BMD  T-score (-2.3) spine and (-2.9) femur on Actonel      Vitamin D deficiency     Back pain 07/18/2007     The patient is currently experiencing pain.      Location: middle back   Intensity (1-10): 6   Description: dull   Duration: constant  Fatigue (0-10): 7      Pain in joint, ankle and foot 10/05/2006    Malignant neoplasm of other specified sites of female breast 06/15/2006     DIAGNOSIS:  Left breast cancer, 3/07.     STAGE:  II-A;  T2N0M0, ER positive, HER-2/neu negative.     PAST THERAPY:  This 72 year old postmenopausal female developed pain in the medial side of the left chest wall in 2/07.  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 She underwent left lumpectomy and sentinel lymph node sampling on 06/07/05. Lumpectomy revealed a 2.1 x 2-cm invasive ductal carcinoma, grade II. No angiolymphatic invasion. There was perineural invasion. 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 Margins were clear. 0/2 sentinel lymph nodes were involved. Oncotype DX was sent and came back with a high recurrence score of 33, which corresponds to a 10-year rate of distant recurrence of 23% with adjuvant tamoxifen alone. For adjuvant chemotherapy, she received four cycles of dose-dense AC and 10 weeks of taxol. Taxol had to be stopped at 10 weeks due to grade 3 peripheral neuropathy. She was placed on adjuvant Arimidex in 10/07.  Radiation therapy was given in [place], [place] and completed in December 2007.    PRESENT THERAPY:  Adjuvant Arimidex since 11/24/05.          Edema 02/28/2006         Review  of Systems   Constitutional: Negative.   HENT: Negative.     Eyes: Negative.    Cardiovascular: Negative.    Respiratory: Negative.     Endocrine: Negative.    Hematologic/Lymphatic: Negative.    Skin: Negative.    Musculoskeletal: Negative.    Gastrointestinal: Negative.    Genitourinary: Negative.    Neurological: Negative.    Psychiatric/Behavioral: Negative.     Allergic/Immunologic: Negative.        Physical Exam    Physical Exam   General Appearance: no distress   Skin: warm, no ulcers or xanthomas   Digits and Nails: no cyanosis or clubbing   Eyes: conjunctivae and lids normal, pupils are equal and round   Teeth/Gums/Palate: dentition unremarkable, no lesions   Lips & Oral Mucosa: no pallor or cyanosis   Neck Veins: normal JVP , neck veins are not distended   Thyroid: no nodules, masses, tenderness or enlargement   Chest Inspection: chest is normal in appearance   Respiratory Effort: breathing comfortably, no respiratory distress   Auscultation/Percussion: lungs clear to auscultation, no rales or rhonchi, no wheezing   PMI: PMI not enlarged or displaced   Cardiac Rhythm: regular rhythm and normal rate   Cardiac Auscultation: S1, S2 normal, no rub, no gallop   Murmurs: no murmur   Peripheral Circulation: normal peripheral circulation   Carotid Arteries: normal carotid upstroke bilaterally, no bruits   Radial Arteries: normal symmetric radial pulses   Abdominal Aorta: no abdominal aortic bruit   Pedal Pulses: normal symmetric pedal pulses   Lower Extremity Edema: no lower extremity edema   Abdominal Exam: soft, non-tender, no masses, bowel sounds normal   Liver & Spleen: no organomegaly   Gait & Station: walks without assistance   Muscle Strength: normal muscle tone   Orientation: oriented to time, place and person   Affect & Mood: appropriate and sustained affect   Language and Memory: patient responsive and seems to comprehend information   Neurologic Exam: neurological assessment grossly intact   Other: moves all extremities      Cardiovascular Health Factors  Vitals BP Readings from Last 3 Encounters:   03/15/22 138/74   12/15/21 132/78   11/12/21 (!) 150/88     Wt Readings from Last 3 Encounters:   03/15/22 66.5 kg (146 lb 9.6 oz)   12/15/21 65.7 kg (144 lb 12.8 oz)   11/12/21 66.7 kg (147 lb)     BMI Readings from Last 3 Encounters:   03/15/22 26.39 kg/m?   12/15/21 26.06 kg/m?   11/12/21 26.46 kg/m?      Smoking Social History     Tobacco Use   Smoking Status Former    Packs/day: 1.00    Years: 14.00    Additional pack years: 0.00    Total pack years: 14.00    Types: Cigarettes    Quit date: 04/07/2000    Years since quitting: 21.9   Smokeless Tobacco Never      Lipid Profile Cholesterol   Date Value Ref Range Status   03/08/2022 188  Final     HDL   Date Value Ref Range Status   03/08/2022 49  Final     LDL   Date Value Ref Range Status   03/08/2022 115 (H) <100 Final     Triglycerides   Date Value Ref Range Status   03/08/2022 124  Final      Blood Sugar No results found for: HGBA1C  Glucose   Date Value  Ref Range Status   03/08/2022 100  Final   05/03/2021 108 (H) 70 - 100 MG/DL Final   16/11/9602 540 (H) 70 - 100 MG/DL Final   98/12/9145 98 70 - 110 MG/DL Final   82/95/6213 086 70 - 110 MG/DL Final   57/84/6962 94 70 - 110 MG/DL Final     Glucose, POC   Date Value Ref Range Status   03/24/2011 117 (H) 70 - 100 MG/DL Final          Problems Addressed Today  Encounter Diagnoses   Name Primary?    Screening for heart disease Yes    Dizziness     Autonomic dysfunction     Vertigo     PSVT (paroxysmal supraventricular tachycardia)     Left bundle branch block     Vestibular dysfunction of both ears        Assessment and Plan       Left bundle branch block  No symptoms to suggest any progression of conduction system disease.    She had echocardiography and stress testing in 2022.  Her ejection fraction is around 50%.        PSVT (paroxysmal supraventricular tachycardia) (HCC)  She has had some occasional episodes of tachypalpitations symptoms that I think are probably related to the increased mental stress she is under right now.    Autonomic dysfunction  She still has occasional days when her blood pressure is low and she is a little bit lightheaded.  She will either drink a cup of chicken bullae on or I suggested that she could just dissolve 1/4 teaspoon of salt in a small glass of warm water.    Vestibular dysfunction of both ears  Vertigo symptoms associated with rapid head movement suggests vestibular dysfunction.  I sent in a referral to her physical therapist because they apparently do offer vestibular therapy as well.      Current Medications (including today's revisions)   atorvastatin (LIPITOR) 20 mg tablet Take 1 tablet by mouth once daily    benzonatate (TESSALON PERLES) 100 mg capsule Take one capsule by mouth every 8 hours as needed for Cough.    CALCIUM CARBONATE (CALCIUM 500 PO) Take 1 Cap by mouth Daily.    Cholecalciferol (VITAMIN D3) 125 mcg (5,000 unit) capsule Take two capsules by mouth daily.    cyclobenzaprine (FLEXERIL) 5 mg tablet     duloxetine DR (CYMBALTA) 60 mg capsule Take one capsule by mouth daily.    flecainide (TAMBOCOR) 50 mg tablet Take 1 tablet by mouth twice daily    HYDROcodone/acetaminophen (NORCO) 5/325 mg tablet Take one tablet by mouth every 12 hours as needed for Pain.    hydroxychloroquine (PLAQUENIL) 200 mg tablet Take 1.5 tablets by mouth daily. take with food    hyoscyamine sulfate (LEVSIN/SL) 0.125 mg sublingual tablet Place one tablet under tongue every 4 hours as needed. Indications: Bladder spasms    metoprolol succinate XL (TOPROL XL) 50 mg extended release tablet Take 1 tablet by mouth once daily    oxybutynin XL (DITROPAN XL) 10 mg tablet TAKE 1 TABLET BY MOUTH ONCE DAILY DO  NOT  CUT  CRUSH  OR  CHEW    polyethylene glycol 3350 (MIRALAX) 17 gram/dose powder Take seventeen g by mouth daily.    traMADoL (ULTRAM) 50 mg tablet Take one tablet by mouth every 4 hours as needed for Pain. Indications: neuropathic pain, pain     Total time spent on today's office visit was  30 minutes.  This includes face-to-face in person visit with patient as well as nonface-to-face time including review of the EMR, outside records, labs, radiologic studies, echocardiogram & other cardiovascular studies, formation of treatment plan, after visit summary, future disposition, and lastly on documentation.

## 2022-03-15 NOTE — Assessment & Plan Note
Vertigo symptoms associated with rapid head movement suggests vestibular dysfunction.  I sent in a referral to her physical therapist because they apparently do offer vestibular therapy as well.

## 2022-03-15 NOTE — Assessment & Plan Note
She has had some occasional episodes of tachypalpitations symptoms that I think are probably related to the increased mental stress she is under right now.

## 2022-03-15 NOTE — Assessment & Plan Note
She still has occasional days when her blood pressure is low and she is a little bit lightheaded.  She will either drink a cup of chicken bullae on or I suggested that she could just dissolve 1/4 teaspoon of salt in a small glass of warm water.

## 2022-03-15 NOTE — Patient Instructions
Vestibular therapy at [facility] sent an order today  1/4 teaspoon salt in a small glass of warm water on days that BP is low.

## 2022-03-16 ENCOUNTER — Encounter: Admit: 2022-03-16 | Discharge: 2022-03-16 | Payer: MEDICARE

## 2022-03-16 ENCOUNTER — Ambulatory Visit: Admit: 2022-03-16 | Discharge: 2022-03-17 | Payer: MEDICARE

## 2022-03-16 DIAGNOSIS — I10 Essential (primary) hypertension: Secondary | ICD-10-CM

## 2022-03-16 DIAGNOSIS — E162 Hypoglycemia, unspecified: Secondary | ICD-10-CM

## 2022-03-16 DIAGNOSIS — E782 Mixed hyperlipidemia: Secondary | ICD-10-CM

## 2022-03-16 DIAGNOSIS — M5137 Other intervertebral disc degeneration, lumbosacral region: Secondary | ICD-10-CM

## 2022-03-16 DIAGNOSIS — G629 Polyneuropathy, unspecified: Secondary | ICD-10-CM

## 2022-03-16 DIAGNOSIS — Z853 Personal history of malignant neoplasm of breast: Secondary | ICD-10-CM

## 2022-03-16 DIAGNOSIS — M533 Sacrococcygeal disorders, not elsewhere classified: Secondary | ICD-10-CM

## 2022-03-16 DIAGNOSIS — C801 Malignant (primary) neoplasm, unspecified: Secondary | ICD-10-CM

## 2022-03-16 DIAGNOSIS — M810 Age-related osteoporosis without current pathological fracture: Secondary | ICD-10-CM

## 2022-03-16 DIAGNOSIS — I38 Endocarditis, valve unspecified: Secondary | ICD-10-CM

## 2022-03-16 DIAGNOSIS — Z9109 Other allergy status, other than to drugs and biological substances: Secondary | ICD-10-CM

## 2022-03-16 DIAGNOSIS — E559 Vitamin D deficiency, unspecified: Secondary | ICD-10-CM

## 2022-03-16 DIAGNOSIS — M5136 Other intervertebral disc degeneration, lumbar region: Secondary | ICD-10-CM

## 2022-03-16 DIAGNOSIS — E785 Hyperlipidemia, unspecified: Secondary | ICD-10-CM

## 2022-03-16 DIAGNOSIS — Z1379 Encounter for other screening for genetic and chromosomal anomalies: Secondary | ICD-10-CM

## 2022-03-16 DIAGNOSIS — M1990 Unspecified osteoarthritis, unspecified site: Secondary | ICD-10-CM

## 2022-03-16 DIAGNOSIS — M549 Dorsalgia, unspecified: Secondary | ICD-10-CM

## 2022-03-16 DIAGNOSIS — M5416 Radiculopathy, lumbar region: Secondary | ICD-10-CM

## 2022-03-16 DIAGNOSIS — F32A Depression: Secondary | ICD-10-CM

## 2022-03-16 DIAGNOSIS — M2550 Pain in unspecified joint: Secondary | ICD-10-CM

## 2022-03-16 DIAGNOSIS — M3500 Sicca syndrome, unspecified: Secondary | ICD-10-CM

## 2022-03-16 DIAGNOSIS — N3020 Other chronic cystitis without hematuria: Secondary | ICD-10-CM

## 2022-03-16 DIAGNOSIS — U071 COVID-19: Secondary | ICD-10-CM

## 2022-03-16 DIAGNOSIS — C50212 Malignant neoplasm of upper-inner quadrant of left female breast: Secondary | ICD-10-CM

## 2022-03-16 DIAGNOSIS — I8390 Asymptomatic varicose veins of unspecified lower extremity: Secondary | ICD-10-CM

## 2022-03-16 DIAGNOSIS — K219 Gastro-esophageal reflux disease without esophagitis: Secondary | ICD-10-CM

## 2022-03-16 MED ORDER — GABAPENTIN 300 MG PO CAP
ORAL_CAPSULE | 3 refills | Status: DC
Start: 2022-03-16 — End: 2022-03-16

## 2022-03-16 MED ORDER — DULOXETINE 60 MG PO CPDR
60 mg | ORAL_CAPSULE | Freq: Every day | ORAL | 3 refills | 60.00000 days | Status: AC
Start: 2022-03-16 — End: ?

## 2022-03-16 MED ORDER — DULOXETINE 60 MG PO CPDR
60 mg | ORAL_CAPSULE | Freq: Every day | ORAL | 3 refills | 60.00000 days | Status: DC
Start: 2022-03-16 — End: 2022-03-16

## 2022-03-16 MED ORDER — GABAPENTIN 300 MG PO CAP
ORAL_CAPSULE | 3 refills | Status: AC
Start: 2022-03-16 — End: ?

## 2022-03-16 NOTE — Progress Notes
[facility] CLINIC NOTE       SUBJECTIVE:   S/p left SI fusion   Still undergoing PT for this     Pain in the beltline and across left hip   Burning like pain   Worse with activity, prolonged sitting     Daughter has ovarian cancer on hospice care   This is stressful and she stays with her through the days   She delayed PT for a while but has restarted     She sees rheumatology for her joints   Was given a steroid dose pack for her hands but has not started taking this yet       Review of Systems    Current Outpatient Medications:     atorvastatin (LIPITOR) 20 mg tablet, Take 1 tablet by mouth once daily, Disp: 90 tablet, Rfl: 3    benzonatate (TESSALON PERLES) 100 mg capsule, Take one capsule by mouth every 8 hours as needed for Cough., Disp: , Rfl:     CALCIUM CARBONATE (CALCIUM 500 PO), Take 1 Cap by mouth Daily., Disp: , Rfl:     Cholecalciferol (VITAMIN D3) 125 mcg (5,000 unit) capsule, Take two capsules by mouth daily., Disp: , Rfl:     cyclobenzaprine (FLEXERIL) 5 mg tablet, , Disp: , Rfl:     duloxetine DR (CYMBALTA) 60 mg capsule, Take one capsule by mouth daily. Indications: neuropathic pain, Disp: 90 capsule, Rfl: 3    flecainide (TAMBOCOR) 50 mg tablet, Take 1 tablet by mouth twice daily, Disp: 60 tablet, Rfl: 11    gabapentin (NEURONTIN) 300 mg capsule, 1 po qhs x 1 week then 1 po bid x 1 week then 1 po tid thereafter  Indications: neuropathic pain, burning, itching, Disp: 90 capsule, Rfl: 3    hydroxychloroquine (PLAQUENIL) 200 mg tablet, Take 1.5 tablets by mouth daily. take with food, Disp: 135 tablet, Rfl: 2    metoprolol succinate XL (TOPROL XL) 50 mg extended release tablet, Take 1 tablet by mouth once daily, Disp: 90 tablet, Rfl: 3    traMADoL (ULTRAM) 50 mg tablet, Take one tablet by mouth every 4 hours as needed for Pain. Indications: neuropathic pain, pain, Disp: 30 tablet, Rfl: 0  Allergies   Allergen Reactions    Sulfa (Sulfonamide Antibiotics) RASH and NAUSEA AND VOMITING     Allergy recorded in SMS: Sulfa     Physical Exam  Vitals:    03/16/22 0948   Pulse: (P) 85   SpO2: (P) 95%   PainSc: (P) Five   Weight: (P) 65.7 kg (144 lb 12.8 oz)   Height: (P) 158.8 cm (5' 2.5)     Oswestry Total Score:: 34  Pain Score: (P) Five  Body mass index is 26.06 kg/m? (pended).  General: Alert, oriented, mild distress  HEENT: normocephalic  NECK: TTP along c spine and paracerv muscles. Limited ROM  Resp: Non labored breathing, no distress  Cardio: Pedal pulses palpable bilaterally, no  lower extremity edema  MS: TTP in lumbar region of spine and paraspinal muscles.  NEURO: Cranial nerves II-XII intact. Motor strength 5+ throughout, sensory exams with def along left l4 and l5 lower ext.  Gait coordinated.   Behavior: Calm, cooperative, behavior and speech normal.             IMPRESSION:  1. Lumbar radiculopathy    2. Sacroiliac joint dysfunction    3. Degeneration of lumbar or lumbosacral intervertebral disc          PLAN:  Encouraged strengthening, stretching and conservative therapies  Continue medication as prescribed - cymbalt refill provided   Consider gabapetnin for nerve pain   Start at night and titrate up to TID   Hopefully this will help her sleep   Suggest she start the dose pack privided by rheumatology to help her joint pain and this may also help alleviate the pain cycle in her back and leg     Refills provided   Discussed med side effects, risks  no aberrant behavior.   4-6 week f.u  telehealth for med eval

## 2022-03-25 NOTE — Progress Notes
Kristen Norris is a 72 y.o. female.    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     TREATMENT SUMMARY:   This 72 year-old postmenopausal female developed pain in the medial side of the left chest wall in 2/07. 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 She underwent left lumpectomy and sentinel lymph node sampling on 06/07/05. Lumpectomy revealed a 2.1 x 2-cm invasive ductal carcinoma, grade II. No angiolymphatic invasion. There was perineural invasion. 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 Margins were clear. 0/2 sentinel lymph nodes were involved. Oncotype DX was sent and came back with a high recurrence score of 33, which corresponds to a 10-year rate of distant recurrence of 23% with adjuvant tamoxifen alone. For adjuvant chemotherapy, she received four cycles of dose-dense AC and 10 weeks of taxol. Taxol had to be stopped at 10 weeks due to grade 3 peripheral neuropathy. She was placed on adjuvant Arimidex in 10/07. Radiation therapy was given in [place], [place] and completed in December 2007.  Adjuvant Femara since 11/24/05 - 11/2010.        Extensive family history of breast and ovarian cancer but genetic testing @ diagnosis not performed due to financial reasons; insurance would not cover.      Family History   Problem Relation Age of Onset    Heart Disease Mother     Hypoglycemia Mother     Heart Disease Father         farmer    Coronary Artery Disease Father     Arthritis Father     Heart Attack Brother 20    Other Brother 67        shot himself 06/30/2019    Complete Hysterectomy Sister         rotator cuff surgery    Other Sister 77        COVID complications    Diabetes Type II Maternal Grandmother     Cancer-Hematologic Daughter 29    COPD Daughter         on oxygen    Tobacco disorder Daughter         on Chantix    Complete Hysterectomy Daughter 9    None Reported Grandchild     Other Grandchild         wrestler-stomach problems    None Reported Grandchild     None Reported Grandchild     None Reported Grandchild     None Reported Grandchild     None Reported Sister     Heart murmur Sister     Heart Disease Sister     Osteopenia Sister     Depression Brother 74        hung himself 05/2017    Other Brother 0        died in utero-umbilical cord wrapped around him    Cancer-Breast Paternal Aunt 55        bilateral mastectomy    Cancer-Breast Other 60        paternal 1st cousin    Cancer-Lung Other 45        paternal 1st cousin    Cancer-Breast Other 23  paternal 1st cousin    Cancer-Ovarian Other 25    None Reported Other     Cancer-Breast Other 28        paternal 1st cousin    Brain Tumor Other 70        nephew    Cancer-Lung Paternal Uncle 50    Cervical Cancer Neg Hx     Cancer-Colon Neg Hx     Cancer-Uterine Neg Hx     Bleeding Disorders Neg Hx     Diabetes Neg Hx      GENETIC TESTING: None in the past  02/08/2018 [facility] myRisk Hereditary Cancer Test:  VUS APC c.1977C>A (p.Asn659Lys)   03/28/2022 Verification of status of VUS through [employer]. Remains variant of uncertain significance.      HISTORY OF PRESENT ILLNESS:   [name] returns to the [facility] today for continued follow-up related to her history of left chest cancer diagnosed in 12-Aug-2005. S/P lumpectomy/SLNBX (0/2). She received adjuvant chemotherapy AC x 4 followed by weekly Taxol x 10 of 12 planned cycles; stopped due to grade 3 peripheral neuropathy. She received WBR in [place] which completed in 01/2006. Ms. Ainsley has an extensive family history of breast and ovarian cancer but genetic testing not done due to financial reasons. At her visit in 01/2016 she complained of horrible bony pain and bone scan was performed 02/05/2016 which was negative for osseous metastatic disease. She also had an elevated Hgb, Hct and RBC as well as hyperkalemia. Abdominal US was performed 02/05/2016 and showed a milk calcium cyst (6mm) in the left kidney, otherwise normal.  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 She is feeling well at this time; is now established with neurology, rheumatology and cardiology.      Review of Systems  Weight (BMI 26.46; # 147 <- 27.72; # 154 <- 26.9; # 152 <- # 26.0; # 146 <- 27.33; # 149 <- 27.18 <- 26.46).   02/08/2018 Myriad myRisk Hereditary Cancer Test:  VUS APC c.1977C>A (p.Asn659Lys)   She is raising her 70 year old great-grandson [place]); granted custody in 08-12-2021.   Her daughter Alvis Lemmings; Guilliani's grandmother) has Stage IV ovarian CA; currently on Hospice (03/28/2022).   Completed femara therapy in 11/2010.    Patient reports that she is getting Prolia every 6 months (PCP). This treatment started in 03/2015.   Broke a left rib 03/14/2017  and then caught a bad cold with coughing and broke another left rib 06/2017.  Saw a rheumatologist and a biopsy 02/22/2018; Dr. Tivis Ringer (diagnosed with Sjorgen's); now following rheumatologist [facility]).   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   Continues on Plaquenil and Flexeril.   Referred to (Dr. Chales Abrahams) neurologist.   Referred to (Dr. Littie Deeds) urologist (now on oxybutnin); underwent cystocele repair 11/13/2020.   Fell in 12/2017; has had several falls since. No injury per patient.   Increasing number of falls.   Kristen Norris (younger brother hung himself 2017-06-12; age 54).   Rosanne Ashing (younger brother shot himself  06/30/2019; age 90).   Byrd Hesselbach (older sister Byrd Hesselbach died from COVID complications 08-13-2019).   Patient reports that she developed COVID-19 in 12/28/2018.   COVID vaccination completed (04/06/2019 and 05/04/2019; Moderna).   COVID booster 12/09/2019; Moderna.  Influenza and bronchitis (01/2021). No fever or chills.   No headache.   SOB and cough continues without changes.   No  chest pain.Referred to (Dr. Barry Dienes) cardiologist (now on metopropolol and flecainide.  No diagnosis of LUE lymphedema.   Felt bilateral lumps in her breasts; bilateral diagnostic mammogram and bilateral breast ultrasound (04/01/2021).   No numbness or tingling of the fingers or toes.  No weakness.   No skin rash.   She had an excision of a mass (09/04/2020; benign pathology); Dr. Flo Shanks.  Chronic lower back pain; established with Dr. Rudell Cobb in the spine center.   Has an appointment with Dr. Seleta Rhymes (09/04/2020); will hopefully have surgery to help with back pain.  No nausea, vomiting, diarrhea, constipation or abdominal pain.   Patient states that she had a colonoscopy ([place], [place]); 2018, normal per patient report.   Total laparoscopic hysterectomy bilateral salpingooophorectomy 03/2011 (pathology negative)  Family history of paternal first cousins x 3 with breast CA (50, 78, 4), 1 paternal aunt with breast CA (37) and 1 paternal cousin with ovarian CA. Patient has 2 daughters.   No hot flashes or vaginal dryness.   No formal exercise.  Former smoker (20 year 1ppd history); quit 1999.     Current Medication List:          albuterol sulfate (PROAIR HFA) 90 mcg/actuation HFA aerosol inhaler Inhale two puffs by mouth into the lungs every 4 hours as needed for Wheezing or Shortness of Breath.    atorvastatin (LIPITOR) 20 mg tablet Take 1 tablet by mouth once daily    benzonatate (TESSALON PERLES) 100 mg capsule Take one capsule by mouth every 8 hours as needed for Cough.    CALCIUM CARBONATE (CALCIUM 500 PO) Take 1 Cap by mouth Daily.    Cholecalciferol (VITAMIN D3) 125 mcg (5,000 unit) capsule Take two capsules by mouth daily.    cyclobenzaprine (FLEXERIL) 5 mg tablet     duloxetine DR (CYMBALTA) 60 mg capsule Take one capsule by mouth daily. Indications: neuropathic pain    flecainide (TAMBOCOR) 50 mg tablet Take 1 tablet by mouth twice daily    gabapentin (NEURONTIN) 300 mg capsule 1 po qhs x 1 week then 1 po bid x 1 week then 1 po tid thereafter  Indications: neuropathic pain, burning, itching    hydroxychloroquine (PLAQUENIL) 200 mg tablet Take 1.5 tablets by mouth daily. take with food    metoprolol succinate XL (TOPROL XL) 50 mg extended release tablet Take 1 tablet by mouth once daily    traMADoL (ULTRAM) 50 mg tablet Take one tablet by mouth every 4 hours as needed for Pain. Indications: neuropathic pain, pain          Objective   Vitals:    03/28/22 1505   BP: 126/65   BP Source: Arm, Right Upper   Pulse: 65   Temp: 36.7 ?C (98.1 ?F)   Resp: 16   SpO2: 96%   TempSrc: Oral   PainSc: Seven   Weight: 66.7 kg (147 lb)         Body mass index is 26.46 kg/m? (pended).        Pain Addressed:   Patient under the care of ortho/neurology    Fatigue Scale:6    ECOG performance status is 0, Fully active, able to carry on all pre-disease performance without restriction.Marland Kitchen    Physical Exam  Vitals reviewed.   Chest:       Lymphadenopathy:      Cervical: No cervical adenopathy.      Upper Body:      Right upper body: No supraclavicular adenopathy.  Left upper body: No supraclavicular adenopathy.   This is a 72 year old female in no acute distress, well developed.   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   Neck: No JVD, supple.   Chest: Wheezes noted in the bilateral lower lobes (unchanged).    CV:  RRR without murmur.   Abdomen: Soft, non-distended, non-tender, positive bowel sounds, no organomegaly.   Skin: No rash.   Extremities: No clubbing, cyanosis.   No evidence of LUE lymphedema.   Neuro: CN: II-XII grossly intact; no sensory or motor abnormalities noted.     CBC w/Diff    Lab Results   Component Value Date/Time    WBC 7.7 03/28/2022 12:36 PM    RBC 5.35 (H) 03/28/2022 12:36 PM    HGB 15.5 (H) 03/28/2022 12:36 PM    HCT 47.7 (H) 03/28/2022 12:36 PM    MCV 89.3 03/28/2022 12:36 PM    MCH 29.0 03/28/2022 12:36 PM    MCHC 32.5 03/28/2022 12:36 PM    RDW 14.3 03/28/2022 12:36 PM    PLTCT 266 03/28/2022 12:36 PM    MPV 8.8 03/28/2022 12:36 PM    Lab Results   Component Value Date/Time    NEUT 67 03/28/2022 12:36 PM    ANC 5.20 03/28/2022 12:36 PM    LYMA 25 03/28/2022 12:36 PM    ALC 1.90 03/28/2022 12:36 PM    MONA 6 03/28/2022 12:36 PM    AMC 0.50 03/28/2022 12:36 PM    EOSA 1 03/28/2022 12:36 PM    AEC 0.10 03/28/2022 12:36 PM    BASA 1 03/28/2022 12:36 PM    ABC 0.10 03/28/2022 12:36 PM        Comprehensive Metabolic Profile    Lab Results   Component Value Date/Time    NA 138 03/28/2022 12:36 PM    K 4.3 03/28/2022 12:36 PM    CL 102 03/28/2022 12:36 PM    CO2 30 03/28/2022 12:36 PM    GAP 6 03/28/2022 12:36 PM    BUN 22 03/28/2022 12:36 PM    CR 0.85 03/28/2022 12:36 PM    GLU 158 (H) 03/28/2022 12:36 PM    GLU 98 09/23/2005 10:13 AM    Lab Results   Component Value Date/Time    CA 9.9 03/28/2022 12:36 PM    ALBUMIN 4.3 03/28/2022 12:36 PM    TOTPROT 7.0 03/28/2022 12:36 PM    ALKPHOS 70 03/28/2022 12:36 PM    AST 17 03/28/2022 12:36 PM    ALT 22 03/28/2022 12:36 PM    TOTBILI 0.6 03/28/2022 12:36 PM    GFR 76.5 03/08/2022 12:00 AM    GFRAA >60 02/21/2019 10:55 AM        Lab Results   Component Value Date    CA125 26 04/01/2021         BREAST IMAGING:   Bilateral mammogram 07/23/08: BIRAD 2-BENIGN   Left breast US 07/23/08: BIRAD 1-NEGATIVE   Bilateral mammogram 08/05/09: BIRAD 1-NEGATIVE   Bilateral mammogram 12/12011: BIRADS 1  Bilateral breast US 01/2010: Multiple real-time grayscale images were utilized to evaluate the numerous sites of palpable fullness within the right upper breast, right medial breast at the 3:00 position, left breast 12:00 position, left breast 11:00 position and the left periareolar region superiorly. No masses are seen. There is no evidence of malignancy Impression:  ASSESSMENT: BIRAD 1-NEGATIVE.   Bilateral mammograms 02/02/2011: BIRADS 1  Bilateral DMAMM (  TOMO) and Left breast ultrasound 02/07/2012:    FINDINGS:  There are scattered fibroglandular densities. 3-D (tomosynthesis) images were performed in addition to 2D ammograms. No masses, densities or calcifications to suggest malignancy. No change when compared to prior studies.    FINDINGS:  Ultrasound was performed to evaluate the area of palpable finding and pain detected by the patient at 12:00 5 cm from the nipple. At this location no abnormalities are present. Additional imaging was performed at areas of skin marking where referring provider palpated a finding. These are located at 1230 o'clock 4 cm from the nipple 11:00 6 cm from the nipple and 1130 5 cm from the nipple. At the locations no abnormalities are present. Scar from prior lumpectomy is noted at 10:00 7 cm from the nipple.  Imaging of the left axilla is unremarkable.  IMPRESSION:  ASSESSMENT: BIRAD 2-BENIGN.    Bilateral mammogram (TOMO) 02/05/2013:  ASSESSMENT: BIRAD 2-BENIGN  Bilateral mammogram (TOMO) 02/04/2014: Results pending at this dictation  ASSESSMENT: BIRAD 4-Suspicious (Overall)  DIAG MAMMO BL/T: BIRAD 4-suspicious abnormality in both breasts.  [place] TARGET BILAT: BIRAD 1-negative.  RECOMMENDATION:  Stereotactic biopsy of the left breast.    Surgical pathology:   Left MTBX 03/10/2014:   Final Diagnosis:   A. Breast, left breast 10:00 with calcs, needle core biopsies: Atrophic breast tissue with focal fibrosis and calcifications.   B. Breast, left breast 10:00 no calcs, needle core biopsies: Benign breast tissue with microcalcification within fibrotic stroma.   Bilateral screening mammogram TOMO 02/05/15: ACR BI-RADS? Assessments: BIRAD 1-Negative  02/05/2016 BILATERAL MAMMOGRAM:  ACR BI-RADS? Assessments: BIRAD 1-Negative  02/05/2016 TARGETED RIGHT BREAST ULTRASOUND: ACR BI-RADS? Assessments: BIRAD 1-Negative  02/06/2017 BILATERAL MAMMOGRAM: ACR BI-RADS? Assessments: BIRAD 2-Benign  02/08/2018 BILATERAL MAMMOGRAM (TOMO):  ASSESSMENT: BIRAD 2-Benign  02/15/2019 BILATERAL MAMMOGRAM (TOMO):  ASSESSMENT: BIRAD 2-Benign  02/17/2020 BILATERAL MAMMOGRAM (TOMO):  ASSESSMENT: BIRAD 2-Benign  04/01/2021 BILATERAL DIAGNOSTIC MAMMOGRAM (TOMO)/BILATERAL BREAST ULTRASOUND:  ASSESSMENT: BIRAD 2-Benign (Overall)   DIAG MAMMO BL/T: BIRAD 2-benign finding.   [place] TARGET BILAT: BIRAD 1-negative.   03/28/2022 BILATERAL MAMMOGRAM (TOMO):   ASSESSMENT:  Left: 1 - Negative  Right: 2 - Benign  Overall: 2 - Benign      BONE HEALTH:   02/08/2018 BMD:  Osteoporosis (-2.8; right femoral neck)  02/17/2020 BMD:  Improved osteoporosis (-2.6; left femoral neck) and (-2.5; right femoral neck).   03/28/2022 BMD:    LUMBAR SPINE, L1-L4   Current: 1.092 g/cm2, T-score of -0.8   Previous: 1.049 g/cm2, T-score of -1.2     LEFT FEMORAL NECK    Current: 0.698 g/cm2, T-score of -2.4   Previous: 0.674 g/cm2, T-score of -2.6     LEFT TOTAL HIP    Current: 0.759 g/cm2, T-score of -2.0   Previous: 0.736 g/cm2, T-score of -2.2     RIGHT FEMORAL NECK   Current: 0.697 g/cm2, T-score of -2.5   Previous: 0.688 g/cm2, T-score of -2.5     RIGHT TOTAL HIP   Current: 0.754 g/cm2, T-score of -2.0   Previous: 0.739 g/cm2, T-score of -2.1       Continues on Reclast annually (Dr. Cecelia Byars); 03/28/2022.     Assessment and Plan:    1. Left breast cancer; T2 Grade II, ER positive, HER-2/neu negative left breast cancer, S/P adjuvant chemotherapy, dose-dense Adriamycin and Cytoxan and 10 weeks of Taxol. Currently on adjuvant Femara since 10/07 and finished 5 years 11/2010.  2. BMD 01/2016; showed continued stable osteoporosis. Patient started Prolia in 03/2015,  has only received 2 injections; then no further treatment. BMD 02/08/2018; progression of osteoporosis. Repeat BMD 02/17/2020: Improvement in bone density. Repeat BMD 03/28/2022: improved osteoporosis. Continues Reclast annually. Repeat in 2 years (2026)  3. Family history of breast and ovarian cancer: no genetic testing due to cost. 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 03/28/2022 Verification of status of VUS through [employer]. Remains variant of uncertain significance.  In the absence of a definitive mutation, the risk for future cancers and medical management recommendations should be based on personal and family history of cancer.  4. Reviewed signs to watch for that could indicate a local or distant recurrence. Patient will call our office with any new signs.   Local recurrence  Signs and symptoms of local recurrence within the same breast may include:   A new lump in your breast or irregular area of firmness   A new thickening in your breast area   A new pulling back of the skin or dimpling at the lumpectomy site   Skin inflammation or area of redness   Flattening or indentation of your nipple or other nipple changes  A regional breast cancer recurrence means the cancer has come back in the lymph nodes in your armpit or collarbone area. Signs and symptoms of regional recurrence may include:   A lump or swelling in the lymph nodes under your arm or in the groove above your collarbone   Swelling of your arm (this could be related to lymphedema or even a blood clot)  Persistent pain in your arm and shoulder   Increasing loss of sensation in your arm and hand  Distant (metastatic) recurrence   A distant, or metastatic, recurrence means the cancer has traveled to distant parts of the body, most commonly the bones, liver and lungs. The signs and symptoms may include:   Pain, such as chest or bone pain   Persistent, dry cough   Difficulty breathing   Loss of appetite   Persistent nausea, vomiting or weight loss   Swelling in the abdomen  Severe headaches  When to call our office   You know your body best -- what feels normal and what doesn't. It's important to be aware of the signs and symptoms of recurrent breast cancer, such as:   New and persistent pain   Changes or new lumps in your breast  Weight loss   Shortness of breath  If you experience any signs and symptoms that might suggest a recurrence, call our office. Patient verbalized understanding and phone numbers provided.    5. RTC one year with bilateral mammogram/lab    Total Time Today was 40 minutes in the following activities: Preparing to see the patient, Obtaining and/or reviewing separately obtained history, Performing a medically appropriate examination and/or evaluation, Counseling and educating the patient/family/caregiver, Ordering medications, tests, or procedures, Referring and communication with other health care professionals (when not separately reported), Documenting clinical information in the electronic or other health record, Independently interpreting results (not separately reported) and communicating results to the patient/family/caregiver, and Care coordination (not separately reported)      Ernie Hew, APRN-BC, CBCN  Nurse Practitioner  Collaborating physician: Dellia Cloud, MD  NPI # [id number]    Sharia Reeve, APRN-NP

## 2022-03-25 NOTE — Patient Instructions
Arianni,     Thank you for coming to see [place] today.   Please call our office or send a message through [place] if you have any questions or concerns.                                                                                                                                                           Sheppard Coil RN, BSN, OCN, CBCN, CN-BN  Clinical Nurse Coordinator for Tona Sensing, APRN-BC        [facility]   [phone] (nurse phone)  [phone] ([place] or [place] 24/7)  [phone] (fax)      [facility]: Monday, Thursday, Friday   Admin: Tuesday  [address] [place]  [place], [place] [address]  Scheduling: [phone]  Fax: [phone]   [facility]: Wednesday  [address]  [place], [place] [address]  Scheduling: [phone]                         [phone]   Fax: [phone] or [phone]

## 2022-03-28 ENCOUNTER — Encounter: Admit: 2022-03-28 | Discharge: 2022-03-28 | Payer: MEDICARE

## 2022-03-28 DIAGNOSIS — C801 Malignant (primary) neoplasm, unspecified: Secondary | ICD-10-CM

## 2022-03-28 DIAGNOSIS — I10 Essential (primary) hypertension: Secondary | ICD-10-CM

## 2022-03-28 DIAGNOSIS — C50212 Malignant neoplasm of upper-inner quadrant of left female breast: Secondary | ICD-10-CM

## 2022-03-28 DIAGNOSIS — E785 Hyperlipidemia, unspecified: Secondary | ICD-10-CM

## 2022-03-28 DIAGNOSIS — Z1273 Encounter for screening for malignant neoplasm of ovary: Secondary | ICD-10-CM

## 2022-03-28 DIAGNOSIS — Z9221 Personal history of antineoplastic chemotherapy: Secondary | ICD-10-CM

## 2022-03-28 DIAGNOSIS — M1990 Unspecified osteoarthritis, unspecified site: Secondary | ICD-10-CM

## 2022-03-28 DIAGNOSIS — E559 Vitamin D deficiency, unspecified: Secondary | ICD-10-CM

## 2022-03-28 DIAGNOSIS — I38 Endocarditis, valve unspecified: Secondary | ICD-10-CM

## 2022-03-28 DIAGNOSIS — E162 Hypoglycemia, unspecified: Secondary | ICD-10-CM

## 2022-03-28 DIAGNOSIS — K219 Gastro-esophageal reflux disease without esophagitis: Secondary | ICD-10-CM

## 2022-03-28 DIAGNOSIS — Z79899 Other long term (current) drug therapy: Secondary | ICD-10-CM

## 2022-03-28 DIAGNOSIS — G629 Polyneuropathy, unspecified: Secondary | ICD-10-CM

## 2022-03-28 DIAGNOSIS — M3500 Sicca syndrome, unspecified: Secondary | ICD-10-CM

## 2022-03-28 DIAGNOSIS — Z1231 Encounter for screening mammogram for malignant neoplasm of breast: Secondary | ICD-10-CM

## 2022-03-28 DIAGNOSIS — I8390 Asymptomatic varicose veins of unspecified lower extremity: Secondary | ICD-10-CM

## 2022-03-28 DIAGNOSIS — Z853 Personal history of malignant neoplasm of breast: Secondary | ICD-10-CM

## 2022-03-28 DIAGNOSIS — M810 Age-related osteoporosis without current pathological fracture: Secondary | ICD-10-CM

## 2022-03-28 DIAGNOSIS — Z9229 Personal history of other drug therapy: Secondary | ICD-10-CM

## 2022-03-28 DIAGNOSIS — Z9109 Other allergy status, other than to drugs and biological substances: Secondary | ICD-10-CM

## 2022-03-28 DIAGNOSIS — Z8041 Family history of malignant neoplasm of ovary: Secondary | ICD-10-CM

## 2022-03-28 DIAGNOSIS — Z1379 Encounter for other screening for genetic and chromosomal anomalies: Secondary | ICD-10-CM

## 2022-03-28 DIAGNOSIS — M2550 Pain in unspecified joint: Secondary | ICD-10-CM

## 2022-03-28 DIAGNOSIS — U071 COVID-19: Secondary | ICD-10-CM

## 2022-03-28 DIAGNOSIS — W19XXXA Unspecified fall, initial encounter: Secondary | ICD-10-CM

## 2022-03-28 DIAGNOSIS — Z13820 Encounter for screening for osteoporosis: Secondary | ICD-10-CM

## 2022-03-28 DIAGNOSIS — Z923 Personal history of irradiation: Secondary | ICD-10-CM

## 2022-03-28 DIAGNOSIS — H547 Unspecified visual loss: Secondary | ICD-10-CM

## 2022-03-28 DIAGNOSIS — E782 Mixed hyperlipidemia: Secondary | ICD-10-CM

## 2022-03-28 DIAGNOSIS — Z8781 Personal history of (healed) traumatic fracture: Secondary | ICD-10-CM

## 2022-03-28 DIAGNOSIS — S2231XA Fracture of one rib, right side, initial encounter for closed fracture: Secondary | ICD-10-CM

## 2022-03-28 DIAGNOSIS — M5136 Other intervertebral disc degeneration, lumbar region: Secondary | ICD-10-CM

## 2022-03-28 DIAGNOSIS — Z9189 Other specified personal risk factors, not elsewhere classified: Secondary | ICD-10-CM

## 2022-03-28 DIAGNOSIS — F32A Depression: Secondary | ICD-10-CM

## 2022-03-28 DIAGNOSIS — M549 Dorsalgia, unspecified: Secondary | ICD-10-CM

## 2022-03-28 DIAGNOSIS — N3020 Other chronic cystitis without hematuria: Secondary | ICD-10-CM

## 2022-03-28 DIAGNOSIS — Z803 Family history of malignant neoplasm of breast: Secondary | ICD-10-CM

## 2022-03-28 LAB — CBC AND DIFF
ABSOLUTE BASO COUNT: 0.1 K/UL (ref 0–0.20)
ABSOLUTE EOS COUNT: 0.1 K/UL (ref 0–0.45)
ABSOLUTE MONO COUNT: 0.5 K/UL (ref 0–0.80)
WBC COUNT: 7.7 K/UL — ABNORMAL HIGH (ref 4.5–11.0)

## 2022-03-28 LAB — COMPREHENSIVE METABOLIC PANEL
ALBUMIN: 4.3 g/dL (ref 3.5–5.0)
ALK PHOSPHATASE: 70 U/L (ref 25–110)
ALT: 22 U/L (ref 7–56)
ANION GAP: 6 K/UL (ref 3–12)
AST: 17 U/L (ref 7–40)
CHLORIDE: 102 MMOL/L — ABNORMAL HIGH (ref 98–110)
CO2: 30 MMOL/L (ref 21–30)
EGFR: >60 mL/min (ref >60)
SODIUM: 138 MMOL/L — ABNORMAL HIGH (ref 137–147)

## 2022-03-28 LAB — CA125: CA-125: 31 U/mL — ABNORMAL HIGH (ref <35)

## 2022-04-06 ENCOUNTER — Encounter: Admit: 2022-04-06 | Discharge: 2022-04-06 | Payer: MEDICARE

## 2022-04-13 ENCOUNTER — Ambulatory Visit: Admit: 2022-04-13 | Discharge: 2022-04-13 | Payer: MEDICARE

## 2022-04-13 ENCOUNTER — Encounter: Admit: 2022-04-13 | Discharge: 2022-04-13 | Payer: MEDICARE

## 2022-04-13 DIAGNOSIS — N3020 Other chronic cystitis without hematuria: Secondary | ICD-10-CM

## 2022-04-13 DIAGNOSIS — I10 Essential (primary) hypertension: Secondary | ICD-10-CM

## 2022-04-13 DIAGNOSIS — Z9109 Other allergy status, other than to drugs and biological substances: Secondary | ICD-10-CM

## 2022-04-13 DIAGNOSIS — M2550 Pain in unspecified joint: Secondary | ICD-10-CM

## 2022-04-13 DIAGNOSIS — F32A Depression: Secondary | ICD-10-CM

## 2022-04-13 DIAGNOSIS — Z853 Personal history of malignant neoplasm of breast: Secondary | ICD-10-CM

## 2022-04-13 DIAGNOSIS — M810 Age-related osteoporosis without current pathological fracture: Secondary | ICD-10-CM

## 2022-04-13 DIAGNOSIS — K219 Gastro-esophageal reflux disease without esophagitis: Secondary | ICD-10-CM

## 2022-04-13 DIAGNOSIS — C801 Malignant (primary) neoplasm, unspecified: Secondary | ICD-10-CM

## 2022-04-13 DIAGNOSIS — E782 Mixed hyperlipidemia: Secondary | ICD-10-CM

## 2022-04-13 DIAGNOSIS — S2231XA Fracture of one rib, right side, initial encounter for closed fracture: Secondary | ICD-10-CM

## 2022-04-13 DIAGNOSIS — G629 Polyneuropathy, unspecified: Secondary | ICD-10-CM

## 2022-04-13 DIAGNOSIS — H547 Unspecified visual loss: Secondary | ICD-10-CM

## 2022-04-13 DIAGNOSIS — I38 Endocarditis, valve unspecified: Secondary | ICD-10-CM

## 2022-04-13 DIAGNOSIS — M1990 Unspecified osteoarthritis, unspecified site: Secondary | ICD-10-CM

## 2022-04-13 DIAGNOSIS — Z1379 Encounter for other screening for genetic and chromosomal anomalies: Secondary | ICD-10-CM

## 2022-04-13 DIAGNOSIS — U071 COVID-19: Secondary | ICD-10-CM

## 2022-04-13 DIAGNOSIS — E162 Hypoglycemia, unspecified: Secondary | ICD-10-CM

## 2022-04-13 DIAGNOSIS — W19XXXA Unspecified fall, initial encounter: Secondary | ICD-10-CM

## 2022-04-13 DIAGNOSIS — E559 Vitamin D deficiency, unspecified: Secondary | ICD-10-CM

## 2022-04-13 DIAGNOSIS — M549 Dorsalgia, unspecified: Secondary | ICD-10-CM

## 2022-04-13 DIAGNOSIS — M5136 Other intervertebral disc degeneration, lumbar region: Secondary | ICD-10-CM

## 2022-04-13 DIAGNOSIS — E785 Hyperlipidemia, unspecified: Secondary | ICD-10-CM

## 2022-04-13 DIAGNOSIS — M3500 Sicca syndrome, unspecified: Secondary | ICD-10-CM

## 2022-04-13 DIAGNOSIS — Z9221 Personal history of antineoplastic chemotherapy: Secondary | ICD-10-CM

## 2022-04-13 DIAGNOSIS — Z923 Personal history of irradiation: Secondary | ICD-10-CM

## 2022-04-13 DIAGNOSIS — C50212 Malignant neoplasm of upper-inner quadrant of left female breast: Secondary | ICD-10-CM

## 2022-04-13 DIAGNOSIS — I8390 Asymptomatic varicose veins of unspecified lower extremity: Secondary | ICD-10-CM

## 2022-04-29 ENCOUNTER — Encounter: Admit: 2022-04-29 | Discharge: 2022-04-29 | Payer: MEDICARE

## 2022-04-29 MED ORDER — FLECAINIDE 50 MG PO TAB
50 mg | ORAL_TABLET | Freq: Two times a day (BID) | ORAL | 3 refills | 30.00000 days | Status: AC
Start: 2022-04-29 — End: ?

## 2022-05-02 ENCOUNTER — Encounter: Admit: 2022-05-02 | Discharge: 2022-05-02 | Payer: MEDICARE

## 2022-05-02 ENCOUNTER — Ambulatory Visit: Admit: 2022-05-02 | Discharge: 2022-05-03 | Payer: MEDICARE

## 2022-05-02 DIAGNOSIS — Z923 Personal history of irradiation: Secondary | ICD-10-CM

## 2022-05-02 DIAGNOSIS — C50212 Malignant neoplasm of upper-inner quadrant of left female breast: Secondary | ICD-10-CM

## 2022-05-02 DIAGNOSIS — M5136 Other intervertebral disc degeneration, lumbar region: Secondary | ICD-10-CM

## 2022-05-02 DIAGNOSIS — E785 Hyperlipidemia, unspecified: Secondary | ICD-10-CM

## 2022-05-02 DIAGNOSIS — F32A Depression: Secondary | ICD-10-CM

## 2022-05-02 DIAGNOSIS — E559 Vitamin D deficiency, unspecified: Secondary | ICD-10-CM

## 2022-05-02 DIAGNOSIS — U071 COVID-19: Secondary | ICD-10-CM

## 2022-05-02 DIAGNOSIS — M1990 Unspecified osteoarthritis, unspecified site: Secondary | ICD-10-CM

## 2022-05-02 DIAGNOSIS — Z9221 Personal history of antineoplastic chemotherapy: Secondary | ICD-10-CM

## 2022-05-02 DIAGNOSIS — S2231XA Fracture of one rib, right side, initial encounter for closed fracture: Secondary | ICD-10-CM

## 2022-05-02 DIAGNOSIS — M3500 Sicca syndrome, unspecified: Secondary | ICD-10-CM

## 2022-05-02 DIAGNOSIS — M2550 Pain in unspecified joint: Secondary | ICD-10-CM

## 2022-05-02 DIAGNOSIS — G629 Polyneuropathy, unspecified: Secondary | ICD-10-CM

## 2022-05-02 DIAGNOSIS — M549 Dorsalgia, unspecified: Secondary | ICD-10-CM

## 2022-05-02 DIAGNOSIS — W19XXXA Unspecified fall, initial encounter: Secondary | ICD-10-CM

## 2022-05-02 DIAGNOSIS — Z853 Personal history of malignant neoplasm of breast: Secondary | ICD-10-CM

## 2022-05-02 DIAGNOSIS — C801 Malignant (primary) neoplasm, unspecified: Secondary | ICD-10-CM

## 2022-05-02 DIAGNOSIS — N3020 Other chronic cystitis without hematuria: Secondary | ICD-10-CM

## 2022-05-02 DIAGNOSIS — I8390 Asymptomatic varicose veins of unspecified lower extremity: Secondary | ICD-10-CM

## 2022-05-02 DIAGNOSIS — H547 Unspecified visual loss: Secondary | ICD-10-CM

## 2022-05-02 DIAGNOSIS — I38 Endocarditis, valve unspecified: Secondary | ICD-10-CM

## 2022-05-02 DIAGNOSIS — E162 Hypoglycemia, unspecified: Secondary | ICD-10-CM

## 2022-05-02 DIAGNOSIS — Z1379 Encounter for other screening for genetic and chromosomal anomalies: Secondary | ICD-10-CM

## 2022-05-02 DIAGNOSIS — K219 Gastro-esophageal reflux disease without esophagitis: Secondary | ICD-10-CM

## 2022-05-02 DIAGNOSIS — M810 Age-related osteoporosis without current pathological fracture: Secondary | ICD-10-CM

## 2022-05-02 DIAGNOSIS — I10 Essential (primary) hypertension: Secondary | ICD-10-CM

## 2022-05-02 DIAGNOSIS — Z9109 Other allergy status, other than to drugs and biological substances: Secondary | ICD-10-CM

## 2022-05-02 DIAGNOSIS — E782 Mixed hyperlipidemia: Secondary | ICD-10-CM

## 2022-05-02 LAB — CBC AND DIFF
ABSOLUTE BASO COUNT: 0 K/UL (ref 0–0.20)
ABSOLUTE EOS COUNT: 0.1 K/UL (ref 0–0.45)
ABSOLUTE MONO COUNT: 0.5 K/UL (ref 0–0.80)
ABSOLUTE NEUTROPHIL: 2.5 K/UL (ref 1.8–7.0)
EOSINOPHILS %: 2 % (ref >60)
HEMATOCRIT: 42 % (ref 36–45)
MCH: 30 pg (ref 26–34)
MCHC: 33 g/dL (ref 32.0–36.0)
MPV: 8.9 FL (ref 7–11)
PLATELET COUNT: 298 K/UL (ref 150–400)
RBC COUNT: 4.6 M/UL (ref 4.0–5.0)
RDW: 15 % — ABNORMAL HIGH (ref 11–15)
WBC COUNT: 4.9 K/UL (ref 4.5–11.0)

## 2022-05-02 LAB — COMPREHENSIVE METABOLIC PANEL
POTASSIUM: 3.8 MMOL/L (ref 3.5–5.1)
SODIUM: 142 MMOL/L (ref 137–147)

## 2022-05-02 LAB — C3 COMPLEMENT 3: C3: 150 mg/dL — AB (ref 88–200)

## 2022-05-02 LAB — SED RATE: ESR: 6 mm/h (ref 0–30)

## 2022-05-02 LAB — C4 COMPLEMENT 4: C4: 38 mg/dL (ref 10–49)

## 2022-05-02 LAB — C REACTIVE PROTEIN (CRP): C-REACTIVE PROTEIN: 0.1 mg/dL (ref <1.0)

## 2022-05-02 LAB — IMMUNOGLOBULINS-IGA,IGG,IGM
IGA: 107 mg/dL (ref 70–390)
IGG: 570 mg/dL — ABNORMAL LOW (ref 762–1488)
IGM: 63 mg/dL (ref 38–328)

## 2022-05-02 MED ORDER — PREDNISONE 2.5 MG PO TAB
ORAL_TABLET | 0 refills | Status: AC
Start: 2022-05-02 — End: ?

## 2022-05-02 NOTE — Progress Notes
Kristen Norris  is a 72 y.o. Caucasian female here for a follow-up visit    Chief complaint: Management of seronegative Sjogren's disease      Interval history:  She was last seen in March 2023 at which time we suggested Evoxac to manage her dry mouth in the context of Sjogren's, she did not tolerate that due to nausea, she is currently using XyliMelts and Biotene mouthwash with reasonable relief, she remains on hydroxychloroquine 300 mg daily since then, unfortunately she has been dealing with progressive pain involving her hands wrist and ankles associate with prolonged morning stiffness and some swelling, she also deals with noninflammatory low back pain, her daughter was diagnosed with stage IV ovarian cancer and is receiving chemotherapy now, she would like to avoid using immunosuppressants given the risk of infection, and thinks that she can manage with a short course of prednisone    No rashes or parotitis      Current medication list:  Reviewed    Examination  BP 134/65 (BP Source: Arm, Right Upper, Patient Position: Sitting)  - Pulse 71  - Temp 37.1 ?C (98.7 ?F) (Temporal)  - Resp 18  - Ht 158.8 cm (5' 2.5)  - Wt 68.5 kg (151 lb)  - LMP  (LMP Unknown)  - SpO2 95%  - BMI 27.18 kg/m?    Very pleasant today and in no acute distress    No evidence of parotitis    On examination with subtle Heberden's nodes bilaterally   Impressive synovitis over many of her MCPs in both wrists noted on exam today      Schirmer's test  OS 3 mm  OD 4 mm     Unstimulated salivary flow rate= 0.16 ml/min                                   Assessment and plan:    1.  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 Osteoporosis: she was on prolia but could not afford it after 2 doses, Fosamax was started in January 2021 but stopped due to epigastric pain  Started Reclast Sep 2022   Last dose in September 2023, follow up DEXA shoed mild imprlvment     3. Generalized osteoarthritis     4. 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 History of Breast cancer: Diagnosed in 2007, in remission since       Plan:  Sjogren's activity labs  Start prednisone as above 10  mg daily with a taper  Continue hydroxychloroquine 300 mg daily     I will see her back in about 6 months    Leverne Humbles, MD    Because this dictation was prepared with voice recognition software, there remains a potential for typographical errors or incorrect word choices by the system. We apologize for any inadvertent inconvenience from such an error.

## 2022-05-02 NOTE — Patient Instructions
It was a pleasure to see you and Kristen Norris please call me have any questions  As discussed, to manage her arthritis without committing you to long-term therapy, would suggest 4-week course of prednisone starting 10 mg daily, you will taper down by 2.5 mg every week until off  Meanwhile please continue hydroxychloroquine without interruption and lets repeat Sjogren's specific blood work today before you leave  Otherwise I will see back in 6 months, if the symptoms recur we will discuss leflunomide as the next step

## 2022-05-03 DIAGNOSIS — M3500 Sicca syndrome, unspecified: Secondary | ICD-10-CM

## 2022-05-09 ENCOUNTER — Encounter: Admit: 2022-05-09 | Discharge: 2022-05-09 | Payer: MEDICARE

## 2022-05-09 ENCOUNTER — Ambulatory Visit: Admit: 2022-05-09 | Discharge: 2022-05-10 | Payer: MEDICARE

## 2022-05-09 DIAGNOSIS — G629 Polyneuropathy, unspecified: Secondary | ICD-10-CM

## 2022-05-09 DIAGNOSIS — I38 Endocarditis, valve unspecified: Secondary | ICD-10-CM

## 2022-05-09 DIAGNOSIS — M5136 Other intervertebral disc degeneration, lumbar region: Secondary | ICD-10-CM

## 2022-05-09 DIAGNOSIS — E162 Hypoglycemia, unspecified: Secondary | ICD-10-CM

## 2022-05-09 DIAGNOSIS — M549 Dorsalgia, unspecified: Secondary | ICD-10-CM

## 2022-05-09 DIAGNOSIS — E782 Mixed hyperlipidemia: Secondary | ICD-10-CM

## 2022-05-09 DIAGNOSIS — N3020 Other chronic cystitis without hematuria: Secondary | ICD-10-CM

## 2022-05-09 DIAGNOSIS — Z923 Personal history of irradiation: Secondary | ICD-10-CM

## 2022-05-09 DIAGNOSIS — S2231XA Fracture of one rib, right side, initial encounter for closed fracture: Secondary | ICD-10-CM

## 2022-05-09 DIAGNOSIS — E559 Vitamin D deficiency, unspecified: Secondary | ICD-10-CM

## 2022-05-09 DIAGNOSIS — M5030 Other cervical disc degeneration, unspecified cervical region: Secondary | ICD-10-CM

## 2022-05-09 DIAGNOSIS — Z9109 Other allergy status, other than to drugs and biological substances: Secondary | ICD-10-CM

## 2022-05-09 DIAGNOSIS — C801 Malignant (primary) neoplasm, unspecified: Secondary | ICD-10-CM

## 2022-05-09 DIAGNOSIS — W19XXXA Unspecified fall, initial encounter: Secondary | ICD-10-CM

## 2022-05-09 DIAGNOSIS — I8390 Asymptomatic varicose veins of unspecified lower extremity: Secondary | ICD-10-CM

## 2022-05-09 DIAGNOSIS — Z9221 Personal history of antineoplastic chemotherapy: Secondary | ICD-10-CM

## 2022-05-09 DIAGNOSIS — F32A Depression: Secondary | ICD-10-CM

## 2022-05-09 DIAGNOSIS — E785 Hyperlipidemia, unspecified: Secondary | ICD-10-CM

## 2022-05-09 DIAGNOSIS — M1990 Unspecified osteoarthritis, unspecified site: Secondary | ICD-10-CM

## 2022-05-09 DIAGNOSIS — Z853 Personal history of malignant neoplasm of breast: Secondary | ICD-10-CM

## 2022-05-09 DIAGNOSIS — K219 Gastro-esophageal reflux disease without esophagitis: Secondary | ICD-10-CM

## 2022-05-09 DIAGNOSIS — C50212 Malignant neoplasm of upper-inner quadrant of left female breast: Secondary | ICD-10-CM

## 2022-05-09 DIAGNOSIS — I10 Essential (primary) hypertension: Secondary | ICD-10-CM

## 2022-05-09 DIAGNOSIS — Z1379 Encounter for other screening for genetic and chromosomal anomalies: Secondary | ICD-10-CM

## 2022-05-09 DIAGNOSIS — H547 Unspecified visual loss: Secondary | ICD-10-CM

## 2022-05-09 DIAGNOSIS — U071 COVID-19: Secondary | ICD-10-CM

## 2022-05-09 DIAGNOSIS — M810 Age-related osteoporosis without current pathological fracture: Secondary | ICD-10-CM

## 2022-05-09 DIAGNOSIS — M2550 Pain in unspecified joint: Secondary | ICD-10-CM

## 2022-05-09 DIAGNOSIS — M3500 Sicca syndrome, unspecified: Secondary | ICD-10-CM

## 2022-05-09 MED ORDER — TRAMADOL 50 MG PO TAB
50 mg | ORAL_TABLET | ORAL | 0 refills | Status: AC | PRN
Start: 2022-05-09 — End: ?

## 2022-06-13 ENCOUNTER — Encounter: Admit: 2022-06-13 | Discharge: 2022-06-13 | Payer: MEDICARE

## 2022-06-13 MED ORDER — ATORVASTATIN 20 MG PO TAB
20 mg | ORAL_TABLET | Freq: Every day | ORAL | 3 refills | Status: AC
Start: 2022-06-13 — End: ?

## 2022-06-13 MED ORDER — METOPROLOL SUCCINATE 50 MG PO TB24
50 mg | ORAL_TABLET | Freq: Every day | ORAL | 3 refills | 90.00000 days | Status: AC
Start: 2022-06-13 — End: ?

## 2022-06-23 ENCOUNTER — Ambulatory Visit: Admit: 2022-06-23 | Discharge: 2022-06-23 | Payer: MEDICARE

## 2022-06-23 ENCOUNTER — Encounter: Admit: 2022-06-23 | Discharge: 2022-06-23 | Payer: MEDICARE

## 2022-06-23 DIAGNOSIS — C801 Malignant (primary) neoplasm, unspecified: Secondary | ICD-10-CM

## 2022-06-23 DIAGNOSIS — K219 Gastro-esophageal reflux disease without esophagitis: Secondary | ICD-10-CM

## 2022-06-23 DIAGNOSIS — H547 Unspecified visual loss: Secondary | ICD-10-CM

## 2022-06-23 DIAGNOSIS — M3500 Sicca syndrome, unspecified: Secondary | ICD-10-CM

## 2022-06-23 DIAGNOSIS — E559 Vitamin D deficiency, unspecified: Secondary | ICD-10-CM

## 2022-06-23 DIAGNOSIS — E785 Hyperlipidemia, unspecified: Secondary | ICD-10-CM

## 2022-06-23 DIAGNOSIS — I10 Essential (primary) hypertension: Secondary | ICD-10-CM

## 2022-06-23 DIAGNOSIS — M2550 Pain in unspecified joint: Secondary | ICD-10-CM

## 2022-06-23 DIAGNOSIS — U071 COVID-19: Secondary | ICD-10-CM

## 2022-06-23 DIAGNOSIS — G629 Polyneuropathy, unspecified: Secondary | ICD-10-CM

## 2022-06-23 DIAGNOSIS — M1990 Unspecified osteoarthritis, unspecified site: Secondary | ICD-10-CM

## 2022-06-23 DIAGNOSIS — Z9221 Personal history of antineoplastic chemotherapy: Secondary | ICD-10-CM

## 2022-06-23 DIAGNOSIS — M5030 Other cervical disc degeneration, unspecified cervical region: Secondary | ICD-10-CM

## 2022-06-23 DIAGNOSIS — M810 Age-related osteoporosis without current pathological fracture: Secondary | ICD-10-CM

## 2022-06-23 DIAGNOSIS — W19XXXA Unspecified fall, initial encounter: Secondary | ICD-10-CM

## 2022-06-23 DIAGNOSIS — S2231XA Fracture of one rib, right side, initial encounter for closed fracture: Secondary | ICD-10-CM

## 2022-06-23 DIAGNOSIS — E782 Mixed hyperlipidemia: Secondary | ICD-10-CM

## 2022-06-23 DIAGNOSIS — E162 Hypoglycemia, unspecified: Secondary | ICD-10-CM

## 2022-06-23 DIAGNOSIS — Z923 Personal history of irradiation: Secondary | ICD-10-CM

## 2022-06-23 DIAGNOSIS — Z1379 Encounter for other screening for genetic and chromosomal anomalies: Secondary | ICD-10-CM

## 2022-06-23 DIAGNOSIS — Z853 Personal history of malignant neoplasm of breast: Secondary | ICD-10-CM

## 2022-06-23 DIAGNOSIS — M5136 Other intervertebral disc degeneration, lumbar region: Secondary | ICD-10-CM

## 2022-06-23 DIAGNOSIS — F32A Depression: Secondary | ICD-10-CM

## 2022-06-23 DIAGNOSIS — C50212 Malignant neoplasm of upper-inner quadrant of left female breast: Secondary | ICD-10-CM

## 2022-06-23 DIAGNOSIS — M533 Sacrococcygeal disorders, not elsewhere classified: Secondary | ICD-10-CM

## 2022-06-23 DIAGNOSIS — I38 Endocarditis, valve unspecified: Secondary | ICD-10-CM

## 2022-06-23 DIAGNOSIS — M549 Dorsalgia, unspecified: Secondary | ICD-10-CM

## 2022-06-23 DIAGNOSIS — Z9109 Other allergy status, other than to drugs and biological substances: Secondary | ICD-10-CM

## 2022-06-23 DIAGNOSIS — I8390 Asymptomatic varicose veins of unspecified lower extremity: Secondary | ICD-10-CM

## 2022-06-23 DIAGNOSIS — N3020 Other chronic cystitis without hematuria: Secondary | ICD-10-CM

## 2022-06-23 MED ORDER — PREGABALIN 25 MG PO CAP
ORAL_CAPSULE | 1 refills | Status: AC
Start: 2022-06-23 — End: ?

## 2022-06-23 NOTE — Progress Notes
SPINE CENTER CLINIC NOTE       SUBJECTIVE:   Chronic back pain   Recently worse   Affects belt-line   Traveling into the buttock and left hip laterally     Gabapentin was DC to see if this would help her swelling   She does continue to have generalized swelling despite stopping this med   She has contacted cardiology and notices she is more SOA   Saw PCP and yesterday and had chest and abd imaging, labs done at [facility], pending results     TRX   Left SI joint 10/12/21   Is undergoing PT actively now for lumbar strengthening   Vestibular therapy for balance as well        Review of Systems   Constitutional: Negative.    HENT: Negative.     Eyes: Negative.    Respiratory: Negative.     Cardiovascular: Negative.    Gastrointestinal: Negative.    Endocrine: Negative.    Genitourinary: Negative.    Musculoskeletal:  Positive for back pain.   Skin: Negative.    Allergic/Immunologic: Negative.    Neurological: Negative.    Hematological: Negative.    Psychiatric/Behavioral: Negative.         Current Outpatient Medications:     albuterol sulfate (PROAIR HFA) 90 mcg/actuation HFA aerosol inhaler, Inhale two puffs by mouth into the lungs every 4 hours as needed for Wheezing or Shortness of Breath., Disp: , Rfl:     atorvastatin (LIPITOR) 20 mg tablet, Take one tablet by mouth daily., Disp: 90 tablet, Rfl: 3    benzonatate (TESSALON PERLES) 100 mg capsule, Take one capsule by mouth every 8 hours as needed for Cough., Disp: , Rfl:     CALCIUM CARBONATE (CALCIUM 500 PO), Take 1 Cap by mouth Daily., Disp: , Rfl:     Cholecalciferol (VITAMIN D3) 125 mcg (5,000 unit) capsule, Take two capsules by mouth daily., Disp: , Rfl:     cholecalciferol (vitD3)/vit K2 (VITAMIN D3-VITAMIN K2 PO), Take 2 drops by mouth daily., Disp: , Rfl:     cyclobenzaprine (FLEXERIL) 5 mg tablet, , Disp: , Rfl:     duloxetine DR (CYMBALTA) 60 mg capsule, Take one capsule by mouth daily. Indications: neuropathic pain, Disp: 90 capsule, Rfl: 3    flecainide (TAMBOCOR) 50 mg tablet, Take one tablet by mouth twice daily., Disp: 180 tablet, Rfl: 3    gabapentin (NEURONTIN) 300 mg capsule, 1 po qhs x 1 week then 1 po bid x 1 week then 1 po tid thereafter  Indications: neuropathic pain, burning, itching, Disp: 90 capsule, Rfl: 3    hydroxychloroquine (PLAQUENIL) 200 mg tablet, Take 1.5 tablets by mouth daily. take with food, Disp: 135 tablet, Rfl: 2    metoprolol succinate XL (TOPROL XL) 50 mg extended release tablet, Take one tablet by mouth daily., Disp: 90 tablet, Rfl: 3    predniSONE (DELTASONE) 2.5 mg tablet, 4 tablets daily for 1 week, 3 tablets daily for 1 week, 2 tablets daily for 1 week, 1 tablet daily for 1 week, Disp: 70 tablet, Rfl: 0    traMADoL (ULTRAM) 50 mg tablet, Take one tablet by mouth every 4 hours as needed for Pain. Indications: neuropathic pain, pain, Disp: 30 tablet, Rfl: 0  Allergies   Allergen Reactions    Sulfa (Sulfonamide Antibiotics) RASH and NAUSEA AND VOMITING     Allergy recorded in SMS: Sulfa     Physical Exam  Vitals:    06/23/22 1347  BP: 131/62   Pulse: 62   SpO2: 95%   PainSc: Six   Weight: 68 kg (150 lb)   Height: 157.5 cm (5' 2)     Oswestry Total Score:: 36  Pain Score: Six  Body mass index is 27.44 kg/m?Marland Kitchen    General: Alert, oriented, mild distress  HEENT: normocephalic  NECK: supple  Resp: Non labored breathing, no distress  Cardio: Pedal pulses palpable bilaterally, mild lower extremity edema  MS: TTP in lumbar region of spine and paraspinal muscles.  NEURO: Cranial nerves II-XII intact. Motor strength 5+ throughout, sensory exams with def to left hip/ buttock  . Gait coordinated.   Behavior: Calm, cooperative, behavior and speech normal.         IMPRESSION:  1. Sacroiliac joint dysfunction    2. Lumbar degenerative disc disease        PLAN: Start Lyrica 25mg . Instructed to start at bedtime and may increase gradually to TID if tolerated   Discussed risks and side effects of the med.   4 week f.u tele to discuss changes   Ok to continue PT and stretches

## 2022-07-08 ENCOUNTER — Encounter: Admit: 2022-07-08 | Discharge: 2022-07-08 | Payer: MEDICARE

## 2022-07-08 NOTE — Telephone Encounter
Patient called stating she has been having episodes of leg weakness lightheaded chest heaviness and high blood pressure.  She describes episodes of increased stress around these episodes one happened when she got lost in [place].  She reports bp 168/107, 163/93, 150/80.  DTR recently passed away.  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  Advised to go to ER if chest discomfort. Advised to discuss grief management with pcp.  Pt to monitor bp x 2 with bp reck in office in two weeks. Routed to [facility] for review and recommendations.

## 2022-07-14 ENCOUNTER — Encounter: Admit: 2022-07-14 | Discharge: 2022-07-14 | Payer: MEDICARE

## 2022-07-14 ENCOUNTER — Ambulatory Visit: Admit: 2022-07-14 | Discharge: 2022-07-15 | Payer: MEDICARE

## 2022-07-14 DIAGNOSIS — M5416 Radiculopathy, lumbar region: Secondary | ICD-10-CM

## 2022-07-14 DIAGNOSIS — G629 Polyneuropathy, unspecified: Secondary | ICD-10-CM

## 2022-07-14 DIAGNOSIS — I38 Endocarditis, valve unspecified: Secondary | ICD-10-CM

## 2022-07-14 DIAGNOSIS — E162 Hypoglycemia, unspecified: Secondary | ICD-10-CM

## 2022-07-14 DIAGNOSIS — S2231XA Fracture of one rib, right side, initial encounter for closed fracture: Secondary | ICD-10-CM

## 2022-07-14 DIAGNOSIS — Z1379 Encounter for other screening for genetic and chromosomal anomalies: Secondary | ICD-10-CM

## 2022-07-14 DIAGNOSIS — C50212 Malignant neoplasm of upper-inner quadrant of left female breast: Secondary | ICD-10-CM

## 2022-07-14 DIAGNOSIS — U071 COVID-19: Secondary | ICD-10-CM

## 2022-07-14 DIAGNOSIS — K219 Gastro-esophageal reflux disease without esophagitis: Secondary | ICD-10-CM

## 2022-07-14 DIAGNOSIS — E785 Hyperlipidemia, unspecified: Secondary | ICD-10-CM

## 2022-07-14 DIAGNOSIS — F32A Depression: Secondary | ICD-10-CM

## 2022-07-14 DIAGNOSIS — E782 Mixed hyperlipidemia: Secondary | ICD-10-CM

## 2022-07-14 DIAGNOSIS — M2550 Pain in unspecified joint: Secondary | ICD-10-CM

## 2022-07-14 DIAGNOSIS — M1990 Unspecified osteoarthritis, unspecified site: Secondary | ICD-10-CM

## 2022-07-14 DIAGNOSIS — M5136 Other intervertebral disc degeneration, lumbar region: Secondary | ICD-10-CM

## 2022-07-14 DIAGNOSIS — N3020 Other chronic cystitis without hematuria: Secondary | ICD-10-CM

## 2022-07-14 DIAGNOSIS — H547 Unspecified visual loss: Secondary | ICD-10-CM

## 2022-07-14 DIAGNOSIS — M5030 Other cervical disc degeneration, unspecified cervical region: Secondary | ICD-10-CM

## 2022-07-14 DIAGNOSIS — Z853 Personal history of malignant neoplasm of breast: Secondary | ICD-10-CM

## 2022-07-14 DIAGNOSIS — Z9109 Other allergy status, other than to drugs and biological substances: Secondary | ICD-10-CM

## 2022-07-14 DIAGNOSIS — I10 Essential (primary) hypertension: Secondary | ICD-10-CM

## 2022-07-14 DIAGNOSIS — I8390 Asymptomatic varicose veins of unspecified lower extremity: Secondary | ICD-10-CM

## 2022-07-14 DIAGNOSIS — M810 Age-related osteoporosis without current pathological fracture: Secondary | ICD-10-CM

## 2022-07-14 DIAGNOSIS — E559 Vitamin D deficiency, unspecified: Secondary | ICD-10-CM

## 2022-07-14 DIAGNOSIS — M549 Dorsalgia, unspecified: Secondary | ICD-10-CM

## 2022-07-14 DIAGNOSIS — Z9221 Personal history of antineoplastic chemotherapy: Secondary | ICD-10-CM

## 2022-07-14 DIAGNOSIS — C801 Malignant (primary) neoplasm, unspecified: Secondary | ICD-10-CM

## 2022-07-14 DIAGNOSIS — Z923 Personal history of irradiation: Secondary | ICD-10-CM

## 2022-07-14 DIAGNOSIS — W19XXXA Unspecified fall, initial encounter: Secondary | ICD-10-CM

## 2022-07-14 DIAGNOSIS — M3500 Sicca syndrome, unspecified: Secondary | ICD-10-CM

## 2022-07-14 MED ORDER — PREGABALIN 50 MG PO CAP
50 mg | ORAL_CAPSULE | Freq: Three times a day (TID) | ORAL | 1 refills | Status: AC
Start: 2022-07-14 — End: ?

## 2022-07-14 NOTE — Progress Notes
SPINE CENTER CLINIC NOTE       SUBJECTIVE:   Chronic back pain   Recently worse   Affects belt-line   Traveling into the buttock and left hip laterally   + weakness sometimes     Gabapentin was DC to see if this would help her swelling   She does continue to have generalized swelling despite stopping this med   Started Lyrica and is taking 25mg  TID, tolerating  She was driving to [place] and when she got out of her truck she noticed severe heaviness in her legs   Felt nauseated as well   Checked her BP in the store, and had 180/106  She contacted her cardiology  Also saw rheumatology due to swelling of joints     TRX   Left SI joint 10/12/21   L4 TFESI 07/23  Is undergoing PT actively now for lumbar strengthening   Vestibular therapy for balance as well        Review of Systems   Constitutional: Negative.    HENT: Negative.     Eyes: Negative.    Respiratory: Negative.     Cardiovascular: Negative.    Gastrointestinal: Negative.    Endocrine: Negative.    Genitourinary: Negative.    Musculoskeletal:  Positive for back pain.   Skin: Negative.    Allergic/Immunologic: Negative.    Neurological: Negative.    Hematological: Negative.    Psychiatric/Behavioral: Negative.         Current Outpatient Medications:     albuterol sulfate (PROAIR HFA) 90 mcg/actuation HFA aerosol inhaler, Inhale two puffs by mouth into the lungs every 4 hours as needed for Wheezing or Shortness of Breath., Disp: , Rfl:     atorvastatin (LIPITOR) 20 mg tablet, Take one tablet by mouth daily., Disp: 90 tablet, Rfl: 3    benzonatate (TESSALON PERLES) 100 mg capsule, Take one capsule by mouth every 8 hours as needed for Cough., Disp: , Rfl:     CALCIUM CARBONATE (CALCIUM 500 PO), Take 1 Cap by mouth Daily., Disp: , Rfl:     Cholecalciferol (VITAMIN D3) 125 mcg (5,000 unit) capsule, Take two capsules by mouth daily., Disp: , Rfl:     cholecalciferol (vitD3)/vit K2 (VITAMIN D3-VITAMIN K2 PO), Take 2 drops by mouth daily., Disp: , Rfl: cyclobenzaprine (FLEXERIL) 5 mg tablet, , Disp: , Rfl:     duloxetine DR (CYMBALTA) 60 mg capsule, Take one capsule by mouth daily. Indications: neuropathic pain, Disp: 90 capsule, Rfl: 3    flecainide (TAMBOCOR) 50 mg tablet, Take one tablet by mouth twice daily., Disp: 180 tablet, Rfl: 3    gabapentin (NEURONTIN) 300 mg capsule, 1 po qhs x 1 week then 1 po bid x 1 week then 1 po tid thereafter  Indications: neuropathic pain, burning, itching, Disp: 90 capsule, Rfl: 3    hydroxychloroquine (PLAQUENIL) 200 mg tablet, Take 1.5 tablets by mouth daily. take with food, Disp: 135 tablet, Rfl: 2    metoprolol succinate XL (TOPROL XL) 50 mg extended release tablet, Take one tablet by mouth daily., Disp: 90 tablet, Rfl: 3    predniSONE (DELTASONE) 2.5 mg tablet, 4 tablets daily for 1 week, 3 tablets daily for 1 week, 2 tablets daily for 1 week, 1 tablet daily for 1 week, Disp: 70 tablet, Rfl: 0    traMADoL (ULTRAM) 50 mg tablet, Take one tablet by mouth every 4 hours as needed for Pain. Indications: neuropathic pain, pain, Disp: 30 tablet, Rfl: 0  Allergies  Allergen Reactions    Sulfa (Sulfonamide Antibiotics) RASH and NAUSEA AND VOMITING     Allergy recorded in SMS: Sulfa     Physical Exam  Vitals:    06/23/22 1347   BP: 131/62   Pulse: 62   SpO2: 95%   PainSc: Six   Weight: 68 kg (150 lb)   Height: 157.5 cm (5' 2)     Oswestry Total Score:: 36  Pain Score: Six  Body mass index is 27.44 kg/m?Marland Kitchen    General: Alert, oriented, mild distress  HEENT: normocephalic  Resp: Non labored breathing, no distress  MS: Sits during call .  Behavior: Calm, cooperative, behavior and speech normal.          IMPRESSION:  1. Lumbar radiculopathy  MRI L-SPINE WO CONTRAST            PLAN:  Change Lyrica to 50mg  BID, and up TID if tolerated   Monitor swelling and adverse effects   If she is having more swelling - would advise not to increase   Plan new MRI L spine to assess for progression of spine disease  Placed orders   Will get scheduled and f.u thereafter

## 2022-07-18 ENCOUNTER — Encounter: Admit: 2022-07-18 | Discharge: 2022-07-18 | Payer: MEDICARE

## 2022-07-18 ENCOUNTER — Ambulatory Visit: Admit: 2022-07-18 | Discharge: 2022-07-18 | Payer: MEDICARE

## 2022-07-18 DIAGNOSIS — Z114 Encounter for screening for human immunodeficiency virus [HIV]: Secondary | ICD-10-CM

## 2022-07-18 DIAGNOSIS — M3500 Sicca syndrome, unspecified: Secondary | ICD-10-CM

## 2022-07-18 DIAGNOSIS — G629 Polyneuropathy, unspecified: Secondary | ICD-10-CM

## 2022-07-18 DIAGNOSIS — Z1379 Encounter for other screening for genetic and chromosomal anomalies: Secondary | ICD-10-CM

## 2022-07-18 DIAGNOSIS — E782 Mixed hyperlipidemia: Secondary | ICD-10-CM

## 2022-07-18 DIAGNOSIS — C50212 Malignant neoplasm of upper-inner quadrant of left female breast: Secondary | ICD-10-CM

## 2022-07-18 DIAGNOSIS — M2550 Pain in unspecified joint: Secondary | ICD-10-CM

## 2022-07-18 DIAGNOSIS — I8390 Asymptomatic varicose veins of unspecified lower extremity: Secondary | ICD-10-CM

## 2022-07-18 DIAGNOSIS — I10 Essential (primary) hypertension: Secondary | ICD-10-CM

## 2022-07-18 DIAGNOSIS — M810 Age-related osteoporosis without current pathological fracture: Secondary | ICD-10-CM

## 2022-07-18 DIAGNOSIS — Z923 Personal history of irradiation: Secondary | ICD-10-CM

## 2022-07-18 DIAGNOSIS — K219 Gastro-esophageal reflux disease without esophagitis: Secondary | ICD-10-CM

## 2022-07-18 DIAGNOSIS — F32A Depression: Secondary | ICD-10-CM

## 2022-07-18 DIAGNOSIS — Z9221 Personal history of antineoplastic chemotherapy: Secondary | ICD-10-CM

## 2022-07-18 DIAGNOSIS — Z9109 Other allergy status, other than to drugs and biological substances: Secondary | ICD-10-CM

## 2022-07-18 DIAGNOSIS — M5136 Other intervertebral disc degeneration, lumbar region: Secondary | ICD-10-CM

## 2022-07-18 DIAGNOSIS — M1990 Unspecified osteoarthritis, unspecified site: Secondary | ICD-10-CM

## 2022-07-18 DIAGNOSIS — N3020 Other chronic cystitis without hematuria: Secondary | ICD-10-CM

## 2022-07-18 DIAGNOSIS — U071 COVID-19: Secondary | ICD-10-CM

## 2022-07-18 DIAGNOSIS — I38 Endocarditis, valve unspecified: Secondary | ICD-10-CM

## 2022-07-18 DIAGNOSIS — E785 Hyperlipidemia, unspecified: Secondary | ICD-10-CM

## 2022-07-18 DIAGNOSIS — H547 Unspecified visual loss: Secondary | ICD-10-CM

## 2022-07-18 DIAGNOSIS — Z853 Personal history of malignant neoplasm of breast: Secondary | ICD-10-CM

## 2022-07-18 DIAGNOSIS — C801 Malignant (primary) neoplasm, unspecified: Secondary | ICD-10-CM

## 2022-07-18 DIAGNOSIS — S2231XA Fracture of one rib, right side, initial encounter for closed fracture: Secondary | ICD-10-CM

## 2022-07-18 DIAGNOSIS — W19XXXA Unspecified fall, initial encounter: Secondary | ICD-10-CM

## 2022-07-18 DIAGNOSIS — M5030 Other cervical disc degeneration, unspecified cervical region: Secondary | ICD-10-CM

## 2022-07-18 DIAGNOSIS — E559 Vitamin D deficiency, unspecified: Secondary | ICD-10-CM

## 2022-07-18 DIAGNOSIS — E162 Hypoglycemia, unspecified: Secondary | ICD-10-CM

## 2022-07-18 DIAGNOSIS — M549 Dorsalgia, unspecified: Secondary | ICD-10-CM

## 2022-07-18 LAB — URINALYSIS DIPSTICK REFLEX TO CULTURE
GLUCOSE,UA: NEGATIVE
NITRITE: NEGATIVE
URINE ASCORBIC ACID, UA: NEGATIVE
URINE BILE: NEGATIVE
URINE BLOOD: NEGATIVE
URINE KETONE: NEGATIVE
URINE PH: 5 (ref 5.0–8.0)

## 2022-07-18 LAB — HIV 1 & 2 AG-AB SCRN W REFLEX TO HIV CONFIRMATION

## 2022-07-18 LAB — COMPREHENSIVE METABOLIC PANEL
ALBUMIN: 4.4 g/dL (ref 3.5–5.0)
ALT: 23 U/L (ref 7–56)
CALCIUM: 10 mg/dL (ref 8.5–10.6)
CHLORIDE: 101 MMOL/L (ref 98–110)
CO2: 31 MMOL/L — ABNORMAL HIGH (ref 21–30)
CREATININE: 1 mg/dL — ABNORMAL HIGH (ref 0.4–1.00)
EGFR: 60 mL/min — ABNORMAL LOW (ref >60)
GLUCOSE,PANEL: 108 mg/dL — ABNORMAL HIGH (ref 70–100)
POTASSIUM: 4.1 MMOL/L — ABNORMAL HIGH (ref 3.5–5.1)
SODIUM: 147 MMOL/L (ref 137–147)
TOTAL PROTEIN: 6.9 g/dL (ref 6.0–8.0)

## 2022-07-18 LAB — PROTEIN/CR RATIO,UR RAN
PROT CREAT RAT/CAL: 0.1 (ref <0.15)
UR CREATININE, RAN: 140 mg/dL
UR TOTAL PROTEIN,RAN: 13 mg/dL

## 2022-07-18 LAB — CBC AND DIFF
ABSOLUTE LYMPH COUNT: 1.6 K/UL — ABNORMAL HIGH (ref 1.0–4.8)
EOSINOPHILS %: 2 % (ref 0–5)
RBC COUNT: 5.1 M/UL — ABNORMAL HIGH (ref 4.0–5.0)
WBC COUNT: 5.3 K/UL (ref 4.5–11.0)

## 2022-07-18 LAB — URINALYSIS MICROSCOPIC REFLEX TO CULTURE

## 2022-07-18 LAB — C REACTIVE PROTEIN (CRP): C-REACTIVE PROTEIN: 0.1 mg/dL — ABNORMAL LOW (ref <1.0)

## 2022-07-18 LAB — HEPATITIS B CORE AB TOT (IGG+IGM)

## 2022-07-18 LAB — HEPATITIS C ANTIBODY W REFLEX HCV PCR QUANT

## 2022-07-18 NOTE — Progress Notes
Clinic Follow Up Visit   Kristen Norris is a 72 y.o. 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 Cecelia Byars in March 2024.  At that time she was on hydroxychloroquine 300 mg daily.  Unfortunately that time she was dealing with progressive pain involving hands, wrist, ankles with prolonged morning stiffness and occasional swelling. Prescribed a medrol dose pack from Dr. Cecelia Byars, but has not taken it yet. Steroids seem to help with the swelling of the hands. Using lidocaine on hands for pain relief. Using aspercreme and lidocaine on back.  Recently with worsening pain, she was prescribed a Medrol Dosepak a few days ago but she reports that she has not started taking this yet.    She reports that recently she had episode of chest pain a few weeks ago when she was driving as well as accompanying nausea.  She stopped the car and pulled over and took aspirin that she keeps on her.  She did not get evaluated at that time, however she did call to her cardiologist and she has been keeping record of her blood pressure and plans to follow-up with them on Wednesday of this week.    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  Pain and swelling is to bilateral knees, feet and ankles as well as occasional hands.  She is seen the spine center for chronic issues with her back and has an upcoming MRI scheduled.  She continues on tramadol, Lyrica, and occasionally Flexeril for her back pain.  She also is doing ongoing physical therapy.    AM stiffness: <15 minutes    No recent infections or fevers or chills, no new rashes, no blood in stool or urine, no lymphadenopathy.  Continued issues with dry mouth and she follows with a dentist regularly.    It has previously been discussed with her about possibly starting immunosuppression with a medication such as leflunomide, however she has been have hesitant to do this as she herself had breast cancer in the past and she recently lost her daughter to ovarian cancer.  She does not like the side effect that leflunomide could increase risk of malignancy due to its immune suppressing effects.    History of Malignancy: History of breast cancer, 2007    Cardiovascular risk factors: Left bundle branch block, hx PSVT. See cardiologist at [facility]    Bone Health: Last DEXA scan February 2024, stable osteoporosis.  On Reclast    Contraception: Hysterectomy      MEDICATIONS:   albuterol sulfate (  PROAIR HFA) 90 mcg/actuation HFA aerosol inhaler Inhale two puffs by mouth into the lungs every 4 hours as needed for Wheezing or Shortness of Breath.    atorvastatin (LIPITOR) 20 mg tablet Take one tablet by mouth daily.    benzonatate (TESSALON PERLES) 100 mg capsule Take one capsule by mouth every 8 hours as needed for Cough.    CALCIUM CARBONATE (CALCIUM 500 PO) Take 1 Cap by mouth Daily.    Cholecalciferol (VITAMIN D3) 125 mcg (5,000 unit) capsule Take two capsules by mouth daily.    cholecalciferol (vitD3)/vit K2 (VITAMIN D3-VITAMIN K2 PO) Take 2 drops by mouth daily.    cyclobenzaprine (FLEXERIL) 5 mg tablet     duloxetine DR (CYMBALTA) 60 mg capsule Take one capsule by mouth daily. Indications: neuropathic pain    flecainide (TAMBOCOR) 50 mg tablet Take one tablet by mouth twice daily.    hydroxychloroquine (PLAQUENIL) 200 mg tablet Take 1.5 tablets by mouth daily. take with food    methylPREDNIsolone (MEDROL (PAK)) 4 mg tablet Take medication as directed on package for 6 days. Take with food.    metoprolol succinate XL (TOPROL XL) 50 mg extended release tablet Take one tablet by mouth daily.    predniSONE (DELTASONE) 2.5 mg tablet 4 tablets daily for 1 week, 3 tablets daily for 1 week, 2 tablets daily for 1 week, 1 tablet daily for 1 week    pregabalin (LYRICA) 50 mg capsule Take one capsule by mouth three times daily. 1 po qhs x 1 week then 1 po bid x 1 week then 1 po tid thereafter    traMADoL (ULTRAM) 50 mg tablet Take one tablet by mouth every 4 hours as needed for Pain. Indications: neuropathic pain, pain          PHYSICAL EXAM:  Vitals:    07/18/22 0848   BP: 113/72   BP Source: Arm, Right Upper   Pulse: 60   Temp: 36.7 ?C (98 ?F)   Resp: 16   SpO2: 96%   TempSrc: Temporal   PainSc: Seven   Weight: 67.1 kg (148 lb)   Height: 157.5 cm (5' 2)     Physical Examination:     GENERAL APPEARANCE: No acute distress     EYES: No scleral erythema or conjunctival injection.     ENT: No oral ulcers or parotid enlargement.     NECK: No masses or thyroid enlargement.     LYMPHATIC: No cervical or supraclavicular lymphadenopathy     CARDIOVASCULAR: Heart rhythm is regular. No murmur, rub or gallop.     CHEST: Normal vesicular breath sounds. No wheezes, rales, pleural or friction rubs.     EXTREMITIES: There is no evidence of clubbing, cyanosis, or edema.     SKIN: No rash, palpable purpura, digital ulcers, abnormal thickening or tight skin     NEUROLOGICAL: Normal gait and station, A &O X3     MUSCULOSKELETAL: See homunculus for joint exam         LABS:  Reviewed in O2    IMAGING: Reviewed in O2    ASSESSMENT:     1.  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  Previously offered her leflunomide but she has been hesitant to start this given the fact that she herself had breast cancer and she recently lost her daughter to ovarian cancer in April 2024.    Recently given a Medrol Dosepak for acute flare.  She has not started taking this medication yet.  With recurrent pain, I did discuss with her about moving forward with leflunomide.  Will check lab work today and she will let me know what she is thinking.  In recent chest tightness as well as associated nausea episode, we will have her wait to start the Medrol Dosepak until she sees her cardiologist on Wednesday to confirm that they are okay with her taking this.    We will check labs today and she may be more open to starting on leflunomide.. Side effects include, but not limited to: Liver toxicity, nausea/diarrhea, hypertension, rash, headache, abdominal pain, hair loss, peripheral neuropathy, pulmonary toxicity, or teratogenic (avoid pregnancy).      Infection prevention: staying up to date with inactivated vaccinations (avoid live vaccinations), contact clinic during significant illness (fever or antibiotic use), and informing clinic if any procedures are scheduled.  If you experience any side effects, please stop the medication and contact me.    She will remain on hydroxychloroquine without any interruption     2. Osteoporosis: she was on prolia but could not afford it after 2 doses, Fosamax was started in January 2021 but stopped due to epigastric pain  Started Reclast Sep 2022   Last dose in September 2023, follow up DEXA showed mild imprlvment      3. Generalized osteoarthritis.  Discussed with her use of copper gloves and paraffin wax baths for hands.  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  I did also send her message and briefly spoke with her about possible steroid injections to bilateral knees if she would find this helpful.     4. Long term Hydroxychloroquine use since mid 2020, last eye exam November 2023, she follows with ophthalmology every 6 months.  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 History of Breast cancer: Diagnosed in 2007, in remission since     PLAN:   Labs today  X-rays to be completed at walk-in location for further assessment of ongoing joint pain  Hold off on taking the Medrol Dosepak and to see her cardiologist on Wednesday and asked them about if they are okay with you taking it given recent symptoms  Avoid use of NSAIDs such as Aleve and ibuprofen given risk factors associate with age as well as recent cardiac events.  Okay to use Tylenol if needed up to 3000 mg daily  Discussed other options with her such as physical therapy, paraffin wax baths, copper gloves, and even possibly steroid injections in the future for osteoarthritic pain  We will plan to start leflunomide if patient agreeable once lab work comes back.  I did discuss risks and benefits and need for continued lab monitoring while this medication.  I also discussed with her that she would need to find a dermatologist to perform yearly skin examinations as there is also an increased risk of not only other malignancies but also dermatological malignancies while on this medication.  Continue physical therapy  Follow-up with Dr. Clearence Cheek as soon as possible in regards to neck surgery site and possible recurrence of enlarging area as you have had  in the past.    Follow-up with Dr. Cecelia Byars a in November 2024 and a nurse practitioner in 3 months.    Bonne Dolores DNP, FNP-BC      Visit Start Time [phone] Visit End Time [phone].  Total of 65 minutes were spent on the same day of the visit including preparing to see the patient, obtaining and/or reviewing separately obtained history, performing a medically appropriate examination and/or evaluation, counseling and educating the patient/family/caregiver, ordering medications, tests, or procedures, referring and communication with other health care professionals, documenting clinical information in the electronic or other health record, independently interpreting results and communicating results to the patient/family/caregiver, and care coordination.

## 2022-07-20 ENCOUNTER — Encounter: Admit: 2022-07-20 | Discharge: 2022-07-20 | Payer: MEDICARE

## 2022-07-21 ENCOUNTER — Encounter: Admit: 2022-07-21 | Discharge: 2022-07-21 | Payer: MEDICARE

## 2022-07-27 ENCOUNTER — Encounter: Admit: 2022-07-27 | Discharge: 2022-07-27 | Payer: MEDICARE

## 2022-07-27 NOTE — Assessment & Plan Note
Her SVT seems to have responded to flecainide 50 mg BID and metoprolol XL 50 mg/day.  Last cardiac monitor was in September, 2020.  That was before we started the flecainide.

## 2022-07-28 ENCOUNTER — Encounter: Admit: 2022-07-28 | Discharge: 2022-07-28 | Payer: MEDICARE

## 2022-07-28 DIAGNOSIS — Z923 Personal history of irradiation: Secondary | ICD-10-CM

## 2022-07-28 DIAGNOSIS — G629 Polyneuropathy, unspecified: Secondary | ICD-10-CM

## 2022-07-28 DIAGNOSIS — I8390 Asymptomatic varicose veins of unspecified lower extremity: Secondary | ICD-10-CM

## 2022-07-28 DIAGNOSIS — M549 Dorsalgia, unspecified: Secondary | ICD-10-CM

## 2022-07-28 DIAGNOSIS — M5136 Other intervertebral disc degeneration, lumbar region: Secondary | ICD-10-CM

## 2022-07-28 DIAGNOSIS — I4892 Unspecified atrial flutter: Secondary | ICD-10-CM

## 2022-07-28 DIAGNOSIS — M1990 Unspecified osteoarthritis, unspecified site: Secondary | ICD-10-CM

## 2022-07-28 DIAGNOSIS — I471 PSVT (paroxysmal supraventricular tachycardia) (HCC): Secondary | ICD-10-CM

## 2022-07-28 DIAGNOSIS — I447 Left bundle-branch block, unspecified: Secondary | ICD-10-CM

## 2022-07-28 DIAGNOSIS — M810 Age-related osteoporosis without current pathological fracture: Secondary | ICD-10-CM

## 2022-07-28 DIAGNOSIS — C50212 Malignant neoplasm of upper-inner quadrant of left female breast: Secondary | ICD-10-CM

## 2022-07-28 DIAGNOSIS — M3500 Sicca syndrome, unspecified: Secondary | ICD-10-CM

## 2022-07-28 DIAGNOSIS — I499 Cardiac arrhythmia, unspecified: Secondary | ICD-10-CM

## 2022-07-28 DIAGNOSIS — I10 Essential (primary) hypertension: Secondary | ICD-10-CM

## 2022-07-28 DIAGNOSIS — R42 Dizziness and giddiness: Secondary | ICD-10-CM

## 2022-07-28 DIAGNOSIS — R1319 Other dysphagia: Secondary | ICD-10-CM

## 2022-07-28 DIAGNOSIS — N3020 Other chronic cystitis without hematuria: Secondary | ICD-10-CM

## 2022-07-28 DIAGNOSIS — M2550 Pain in unspecified joint: Secondary | ICD-10-CM

## 2022-07-28 DIAGNOSIS — E559 Vitamin D deficiency, unspecified: Secondary | ICD-10-CM

## 2022-07-28 DIAGNOSIS — M545 Chronic bilateral low back pain without sciatica: Secondary | ICD-10-CM

## 2022-07-28 DIAGNOSIS — Z9221 Personal history of antineoplastic chemotherapy: Secondary | ICD-10-CM

## 2022-07-28 DIAGNOSIS — M5030 Other cervical disc degeneration, unspecified cervical region: Secondary | ICD-10-CM

## 2022-07-28 DIAGNOSIS — K219 Gastro-esophageal reflux disease without esophagitis: Secondary | ICD-10-CM

## 2022-07-28 DIAGNOSIS — E785 Hyperlipidemia, unspecified: Secondary | ICD-10-CM

## 2022-07-28 DIAGNOSIS — I38 Endocarditis, valve unspecified: Secondary | ICD-10-CM

## 2022-07-28 DIAGNOSIS — E162 Hypoglycemia, unspecified: Secondary | ICD-10-CM

## 2022-07-28 DIAGNOSIS — Z1379 Encounter for other screening for genetic and chromosomal anomalies: Secondary | ICD-10-CM

## 2022-07-28 DIAGNOSIS — F32A Depression: Secondary | ICD-10-CM

## 2022-07-28 DIAGNOSIS — U071 COVID-19: Secondary | ICD-10-CM

## 2022-07-28 DIAGNOSIS — Z853 Personal history of malignant neoplasm of breast: Secondary | ICD-10-CM

## 2022-07-28 DIAGNOSIS — G909 Disorder of the autonomic nervous system, unspecified: Secondary | ICD-10-CM

## 2022-07-28 DIAGNOSIS — H547 Unspecified visual loss: Secondary | ICD-10-CM

## 2022-07-28 DIAGNOSIS — S2231XA Fracture of one rib, right side, initial encounter for closed fracture: Secondary | ICD-10-CM

## 2022-07-28 DIAGNOSIS — W19XXXA Unspecified fall, initial encounter: Secondary | ICD-10-CM

## 2022-07-28 DIAGNOSIS — E782 Mixed hyperlipidemia: Secondary | ICD-10-CM

## 2022-07-28 DIAGNOSIS — C801 Malignant (primary) neoplasm, unspecified: Secondary | ICD-10-CM

## 2022-07-28 DIAGNOSIS — Z9109 Other allergy status, other than to drugs and biological substances: Secondary | ICD-10-CM

## 2022-07-28 NOTE — Assessment & Plan Note
We called over to [facility] primary care clinic and definitely suggested that she be scheduled for upper endoscopy as soon as possible.  I suspect that she may have an esophgeal ring or worse as the cause of her swallowing difficulties, vomiting, and chest discomfort.

## 2022-07-28 NOTE — Assessment & Plan Note
She's having a flare of back pain and having to work with Physical Therapy.

## 2022-07-28 NOTE — Telephone Encounter
Pt called and wanted referral/order to sent to [facility] hospital to see if she can get the MRI sooner at that hospital than [facility]. She wants to keep her appt here just in case she can't get in sooner though. RN sent paperwork to [facility] [phone]  RN told her they will need to auth it if done there. Pt understood.

## 2022-07-28 NOTE — Patient Instructions
MAC Nuclear Stress Test Instructions    PLEASE REPORT TO:    ____[facility] ([address], [address], [place], [place]) - [phone]   ____[facility] ([address], [address], [place], [place]) - [phone]    ____[place] [facility] ([address] [place], [place]) - [phone]    ____[facility] ([address], [place], [place]) - [phone]   ____[place] [facility] ([address], [place], [place] ) - [phone]     MAC Main Phone Number: [phone]     Date of Test  ____________  at ____________  for ________________________    Are you able to raise your arm up by your head for about 20 minutes? yes  Can you lie on your back for approximately 20 minutes with minimal movement? yes     The Thallium evaluation has two parts -- two nuclear scans.   The first scan is done in the morning and the second three to four hours later.     Wear comfortable clothing. Bring or wear comfortable walking shoes.     It is recommended not to hold infants for 2 to 3 days after the test.   Please let the nuclear technologists know if you plan on flying after the test.     NO DECAF OR CAFFEINE 24 HOURS PRIOR TO TEST. Examples: coffee, tea, decaf coffee or tea, cola, chocolate.     DO NOT EAT OR DRINK THE MORNING OF YOUR TEST unless otherwise instructed. (You may have a couple sips of water.) If you are a diabetic, if insulin dependent: please take one third of your insulin with a light breakfast (two pieces of dry toast and a small juice). Bring insulin and medication with you to the test.     ___ TAKE MORNING MEDICATIONS WITH A COUPLE SIPS OF WATER PRIOR TO TEST.     Hold all vitamins and supplements on the morning of your test.     HOLD THE FOLLOWING MEDICATIONS AS INDICATED BELOW:             WHAT TO DO BETWEEN THE FIRST TWO THALLIUM SCANS:  1. No strenuous exercise should be performed during this time.  2. A light lunch is permissible. The technologist will give you a list of appropriate foods.  3. Please return 15 minutes prior to the schedule of your second scan. Our nuclear technologist will tell you exactly what time to return.  4. Please do not use tobacco products in between scans.  5. After the first scan is completed, you may resume usual medications.     TEST FINDINGS:  You will receive the results of the test within 7 business days of its completion by telephone, unless arranged differently at the time of the procedure.  If you have any questions concerning your thallium test or if you do not hear from your MAC physician/or nurse within 7 business days, please call the appropriate office checked above.

## 2022-07-28 NOTE — Assessment & Plan Note
She's having dysphagia for solids and sometimes liquids.  She has GERD, but has never had upper endoscopy.

## 2022-07-28 NOTE — Progress Notes
Date of Service: 07/28/2022    Kristen Norris is a 72 y.o. female.       HPI     Kristen Norris was in the [place] clinic today for follow-up regarding left bundle branch block.,  PSVT, and episodes of chest discomfort.  She has been through a terribly stressful time with her granddaughter dying recently of ovarian carcinoma.  The granddaughter's 52-year-old son is living with Kristen Norris and she is currently his guardian.    She was driving with her grandson down to [facility] a couple of weeks ago and said that they were having a grand time when she suddenly developed chest pressure and vomited.    She has been having trouble swallowing solids and occasionally has trouble with liquids as well.    She has not had trouble with palpitation symptoms.  She denies any syncope or near syncope and she has had no TIA or stroke symptoms.         Vitals:    07/28/22 1442   BP: 135/86   BP Source: Arm, Right Upper   Pulse: 59   SpO2: 93%   O2 Device: None (Room air)   PainSc: Zero   Weight: 70.1 kg (154 lb 9.6 oz)   Height: 158.8 cm (5' 2.5)     Body mass index is 27.83 kg/m?Marland Kitchen     Past Medical History  Patient Active Problem List    Diagnosis Date Noted    Esophageal dysphagia 07/28/2022    PSVT (paroxysmal supraventricular tachycardia) (HCC) 03/15/2022    Vestibular dysfunction of both ears 03/15/2022    Chronic left-sided low back pain with left-sided sciatica 05/08/2021    Prolapse of female pelvic organs 11/13/2020    Other osteoporosis without current pathological fracture 10/19/2020    Soft tissue mass 08/18/2020    Rectocele, female 06/03/2020     06/03/20 - pt has 2-3+ rectocele, more prominent on standing with symptoms of urinary incontinence.      Frequent urinary incontinence 06/03/2020    Autonomic dysfunction 06/06/2019    Other neutropenia (HCC) 02/26/2019    PSVT (paroxysmal supraventricular tachycardia) (HCC) 11/01/2018     09/2018 - Intermittent near syncope.  2-week cardiac monitor shows episodes of fast SVT 3-20 seconds in duration.      Overactive bladder 10/31/2018    Xerostomia 02/23/2018    Rheumatoid factor positive 02/23/2018    Arthralgia of right temporomandibular joint 02/23/2018    S/P hysterectomy with oophorectomy 07/15/2011    Pelvic pain in female 07/15/2011    Cystocele 07/15/2011    Left bundle branch block 03/17/2011    Preoperative evaluation to rule out surgical contraindication 03/17/2011    Abnormal electro-oculogram 03/17/2011    Ovarian cyst, left 01/05/2011    Degeneration of lumbar or lumbosacral intervertebral disc 04/18/2010    Thoracic or lumbosacral neuritis or radiculitis, unspecified 04/18/2010    S/P Lumpectomy of Breast-Surgical Oncology Notes 12/17/2007     DIAGNOSIS:  Left breast Invasive Ductal Cancer diagnosed 3/07,  STAGE II-A  T2 N0 M0  Markers:  ER 100%, PR 0%, Ki67 15%, HER-2/neu negative.     PROCEDURE: Left lumpectomy with sentinel node biopsy 06/07/2005    HPI: This 72 year old postmenopausal female developed pain in the medial side of the left chest wall in 2/07.  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 She underwent left lumpectomy and sentinel  lymph node sampling on 06/07/05. Lumpectomy revealed a 2.1 x 2-cm invasive ductal carcinoma, grade II. No angiolymphatic invasion. There was perineural invasion. 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 Margins were clear. 0/2 sentinel lymph nodes were involved.   Oncotype DX was sent and came back with a high recurrence score of 33, which corresponds to a 10-year rate of distant recurrence of 23% with adjuvant tamoxifen alone. For adjuvant chemotherapy, she received four cycles of dose-dense AC and 10 weeks of taxol by Dr. Osborn Coho.  . Taxol had to be stopped at 10 weeks due to grade 3 peripheral neuropathy. She was placed on adjuvant Arimidex in 10/07.  Radiation therapy was given by Dr. Joycelyn Man in [place], [place] and completed in December 2007.   She was last seen in 12/2007 with c/o left axillary burning.  On exam there was a stable scar retraction at left lumpectomy site in left UIQ. No palpable mass, no nipple or areolar change. Right breast: palpable fullness with tenderness right UIQ most consistent with prominent pectoral muscle (patient works heavy labor as gardener). Left diagnostic mammogram 11/09 was class 2. Right breast with palpable tender fullness right upper breast/chest wall, benign clinically. Right breast sonogram also negative 11/09.  Follow-up exam 03/2008 assessment:  Right breast pain- negative clinical exam as well as negative Ultrasound. Findings and complaint most consistent with musculoskeletal etiology (patient works in Aeronautical engineer).  She now returns for a 3 month f/u exam to confirm this impression.           GERD (gastroesophageal reflux disease)     Depression     Environmental allergies     PN (Peripheral Neuropathy)     Hypoglycemia     Osteoporosis      BMD  T-score (-2.3) spine and (-2.9) femur on Actonel      Vitamin D deficiency     Back pain 07/18/2007     The patient is currently experiencing pain.      Location: middle back   Intensity (1-10): 6   Description: dull   Duration: constant  Fatigue (0-10): 7      Pain in joint, ankle and foot 10/05/2006    Malignant neoplasm of other specified sites of female breast 06/15/2006     DIAGNOSIS:  Left breast cancer, 3/07.     STAGE:  II-A;  T2N0M0, ER positive, HER-2/neu negative.     PAST THERAPY:  This 72 year old postmenopausal female developed pain in the medial side of the left chest wall in 2/07.  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 She underwent left lumpectomy and sentinel lymph node sampling on 06/07/05. Lumpectomy revealed a 2.1 x 2-cm invasive ductal carcinoma, grade II. No angiolymphatic invasion. There was perineural invasion. 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 Margins were clear. 0/2 sentinel lymph nodes were involved. Oncotype DX was sent and came back with a high recurrence score of 33, which corresponds to a 10-year rate of distant recurrence of 23% with adjuvant tamoxifen alone. For adjuvant chemotherapy, she received four cycles of dose-dense AC and 10 weeks of taxol. Taxol had to be stopped at 10 weeks due to grade 3 peripheral neuropathy. She was placed on adjuvant Arimidex in 10/07.  Radiation therapy was given in [place], [place] and completed in December 2007.    PRESENT THERAPY:  Adjuvant Arimidex since 11/24/05.  Edema 02/28/2006         Review of Systems   Constitutional: Negative.   HENT: Negative.     Eyes: Negative.    Cardiovascular:  Positive for leg swelling.   Respiratory: Negative.     Endocrine: Negative.    Hematologic/Lymphatic: Negative.    Skin: Negative.    Musculoskeletal: Negative.    Gastrointestinal: Negative.    Genitourinary: Negative.    Neurological: Negative.    Psychiatric/Behavioral: Negative.     Allergic/Immunologic: Negative.        Physical Exam    Physical Exam   General Appearance: no distress   Skin: warm, no ulcers or xanthomas   Digits and Nails: no cyanosis or clubbing   Eyes: conjunctivae and lids normal, pupils are equal and round   Teeth/Gums/Palate: dentition unremarkable, no lesions   Lips & Oral Mucosa: no pallor or cyanosis   Neck Veins: normal JVP , neck veins are not distended   Thyroid: no nodules, masses, tenderness or enlargement   Chest Inspection: chest is normal in appearance   Respiratory Effort: breathing comfortably, no respiratory distress   Auscultation/Percussion: lungs clear to auscultation, no rales or rhonchi, no wheezing   PMI: PMI not enlarged or displaced   Cardiac Rhythm: regular rhythm and normal rate   Cardiac Auscultation: S1, S2 normal, no rub, no gallop   Murmurs: no murmur   Peripheral Circulation: normal peripheral circulation   Carotid Arteries: normal carotid upstroke bilaterally, no bruits   Radial Arteries: normal symmetric radial pulses   Abdominal Aorta: no abdominal aortic bruit   Pedal Pulses: normal symmetric pedal pulses   Lower Extremity Edema: no lower extremity edema   Abdominal Exam: soft, non-tender, no masses, bowel sounds normal   Liver & Spleen: no organomegaly   Gait & Station: walks without assistance   Muscle Strength: normal muscle tone   Orientation: oriented to time, place and person   Affect & Mood: appropriate and sustained affect   Language and Memory: patient responsive and seems to comprehend information   Neurologic Exam: neurological assessment grossly intact   Other: moves all extremities      Cardiovascular Health Factors  Vitals BP Readings from Last 3 Encounters:   07/28/22 135/86   07/18/22 113/72   06/23/22 131/62     Wt Readings from Last 3 Encounters:   07/28/22 70.1 kg (154 lb 9.6 oz)   07/18/22 67.1 kg (148 lb)   07/14/22 68 kg (150 lb)     BMI Readings from Last 3 Encounters:   07/28/22 27.83 kg/m?   07/18/22 27.07 kg/m?   07/14/22 27.44 kg/m?      Smoking Social History     Tobacco Use   Smoking Status Former    Current packs/day: 0.00    Average packs/day: 1 pack/day for 14.0 years (14.0 ttl pk-yrs)    Types: Cigarettes    Start date: 04/08/1986    Quit date: 04/07/2000    Years since quitting: 22.3   Smokeless Tobacco Never      Lipid Profile Cholesterol   Date Value Ref Range Status   03/08/2022 188  Final     HDL   Date Value Ref Range Status   03/08/2022 49  Final     LDL   Date Value Ref Range Status   03/08/2022 115 (H) <100 Final     Triglycerides   Date Value Ref Range Status   03/08/2022 124  Final      Blood  Sugar No results found for: HGBA1C  Glucose   Date Value Ref Range Status   07/18/2022 108 (H) 70 - 100 MG/DL Final   19/14/7829 83 70 - 100 MG/DL Final   56/21/3086 578 (H) 70 - 100 MG/DL Final   46/96/2952 98 70 - 110 MG/DL Final   84/13/2440 102 70 - 110 MG/DL Final   72/53/6644 94 70 - 110 MG/DL Final     Glucose, POC   Date Value Ref Range Status   03/24/2011 117 (H) 70 - 100 MG/DL Final          Problems Addressed Today  Encounter Diagnoses   Name Primary?    Autonomic dysfunction Yes    PSVT (paroxysmal supraventricular tachycardia) (HCC)     Left bundle branch block     Chronic bilateral low back pain without sciatica     Gastroesophageal reflux disease without esophagitis     Esophageal dysphagia        Assessment and Plan       Autonomic dysfunction  Since her daughter died of ovarian carcinoma a few weeks ago her BP has been quite labile and elevated.    PSVT (paroxysmal supraventricular tachycardia) (HCC)  Her SVT seems to have responded to flecainide 50 mg BID and metoprolol XL 50 mg/day.  Last cardiac monitor was in September, 2020.  That was before we started the flecainide.    Left bundle branch block  Her last echocardiogram was 2022, showing EF 45%.  Due to technical challenges related to LBBB we did a RVG that came back with EF 50-55%.  She really hasn't heart failure presentation.    I've ordered another echocardiogram as well as a regadenoson thallium stress test.    Back pain  She's having a flare of back pain and having to work with Physical Therapy.    GERD (Gastroesophageal Reflux Disease)  She's having dysphagia for solids and sometimes liquids.  She has GERD, but has never had upper endoscopy.      Esophageal dysphagia  We called over to [facility] primary care clinic and definitely suggested that she be scheduled for upper endoscopy as soon as possible.  I suspect that she may have an esophgeal ring or worse as the cause of her swallowing difficulties, vomiting, and chest discomfort.    Current Medications (including today's revisions)   albuterol sulfate (PROAIR HFA) 90 mcg/actuation HFA aerosol inhaler Inhale two puffs by mouth into the lungs every 4 hours as needed for Wheezing or Shortness of Breath.    atorvastatin (LIPITOR) 20 mg tablet Take one tablet by mouth daily.    benzonatate (TESSALON PERLES) 100 mg capsule Take one capsule by mouth every 8 hours as needed for Cough.    CALCIUM CARBONATE (CALCIUM 500 PO) Take 1 Cap by mouth Daily.    Cholecalciferol (VITAMIN D3) 125 mcg (5,000 unit) capsule Take two capsules by mouth daily.    cholecalciferol (vitD3)/vit K2 (VITAMIN D3-VITAMIN K2 PO) Take 2 drops by mouth daily.    cyclobenzaprine (FLEXERIL) 5 mg tablet     duloxetine DR (CYMBALTA) 60 mg capsule Take one capsule by mouth daily. Indications: neuropathic pain    flecainide (TAMBOCOR) 50 mg tablet Take one tablet by mouth twice daily.    furosemide (LASIX) 20 mg tablet Take one tablet by mouth.    hydroxychloroquine (PLAQUENIL) 200 mg tablet Take 1.5 tablets by mouth daily. take with food    leflunomide (ARAVA) 10 mg tablet Take one tablet by mouth  daily.    methylPREDNIsolone (MEDROL (PAK)) 4 mg tablet Take medication as directed on package for 6 days. Take with food.    metoprolol succinate XL (TOPROL XL) 50 mg extended release tablet Take one tablet by mouth daily.    pregabalin (LYRICA) 50 mg capsule Take one capsule by mouth three times daily. 1 po qhs x 1 week then 1 po bid x 1 week then 1 po tid thereafter    traMADoL (ULTRAM) 50 mg tablet Take one tablet by mouth every 4 hours as needed for Pain. Indications: neuropathic pain, pain     Total time spent on today's office visit was 30 minutes.  This includes face-to-face in person visit with patient as well as nonface-to-face time including review of the EMR, outside records, labs, radiologic studies, echocardiogram & other cardiovascular studies, formation of treatment plan, after visit summary, future disposition, and lastly on documentation.

## 2022-08-01 ENCOUNTER — Encounter: Admit: 2022-08-01 | Discharge: 2022-08-01 | Payer: MEDICARE

## 2022-08-02 IMAGING — CT CT lumbar spine wo con
3 of 4 series · 11 of 24 positions shown, 14 images · non-contrast
Comparison: none

[Series 4: l-spine cor 2.00 br60 s3 · coronal · 0.31mm/px · 3 of 12 slices shown]
[im 3/12  bone]
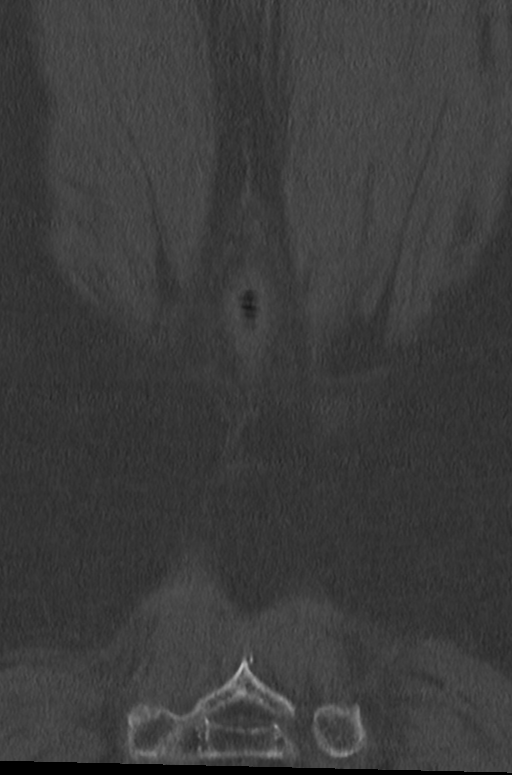
[im 5/12  bone]
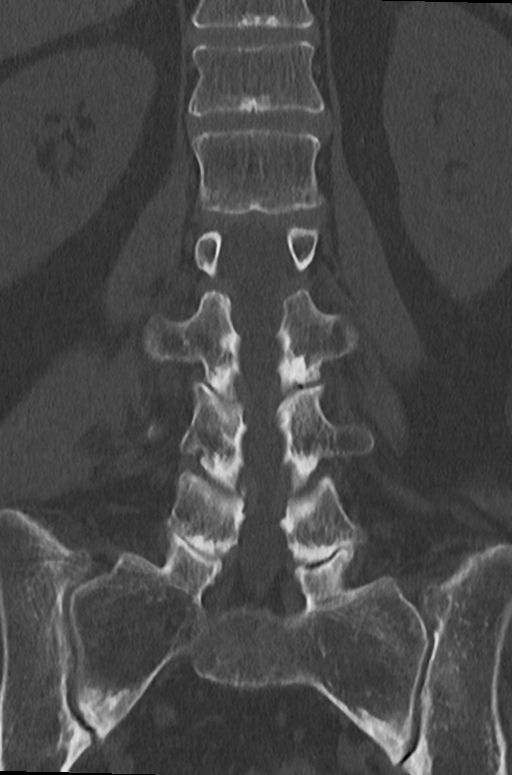
[im 7/12  bone]
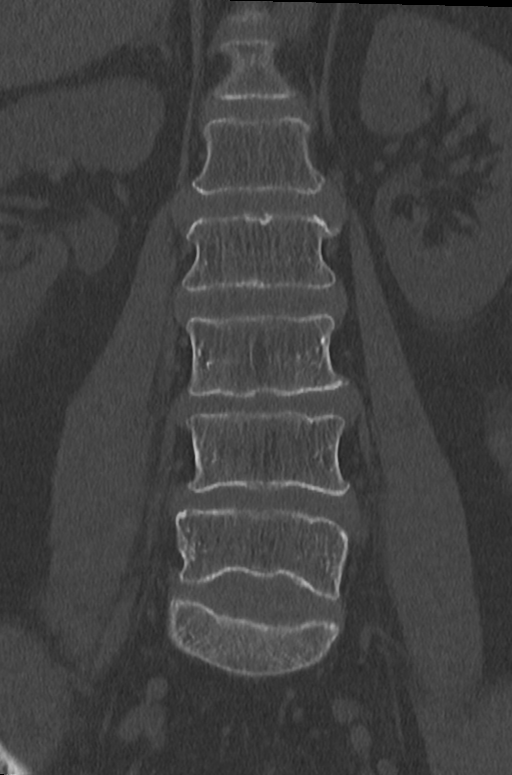

[Series 6: l-spine sag 2.00 br60 s3 · sagittal · 0.27mm/px · 2 of 4 slices shown, 3 images]
[im 2/4  soft-tissue]
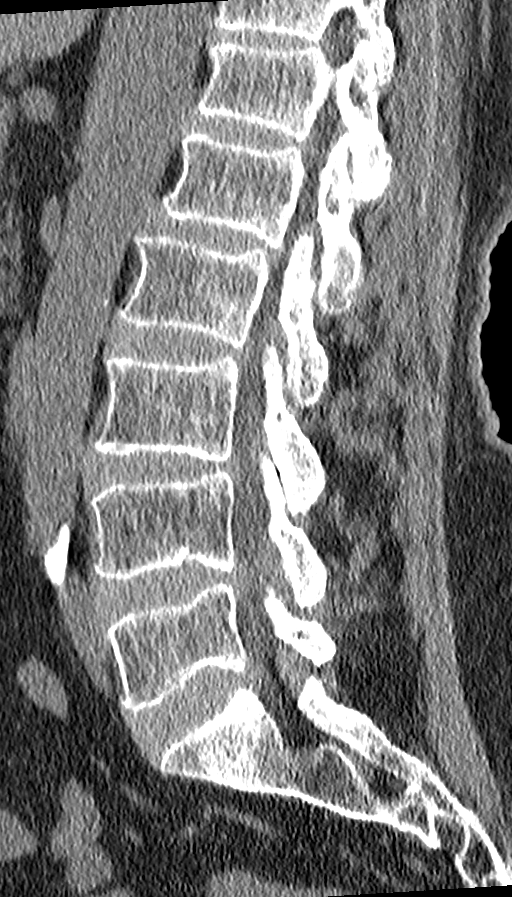
[im 2/4  bone]
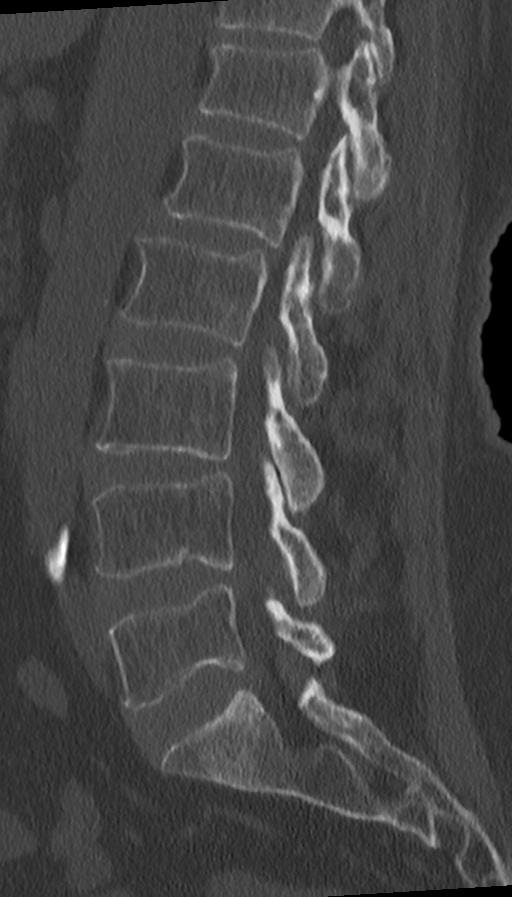
[im 3/4  bone]
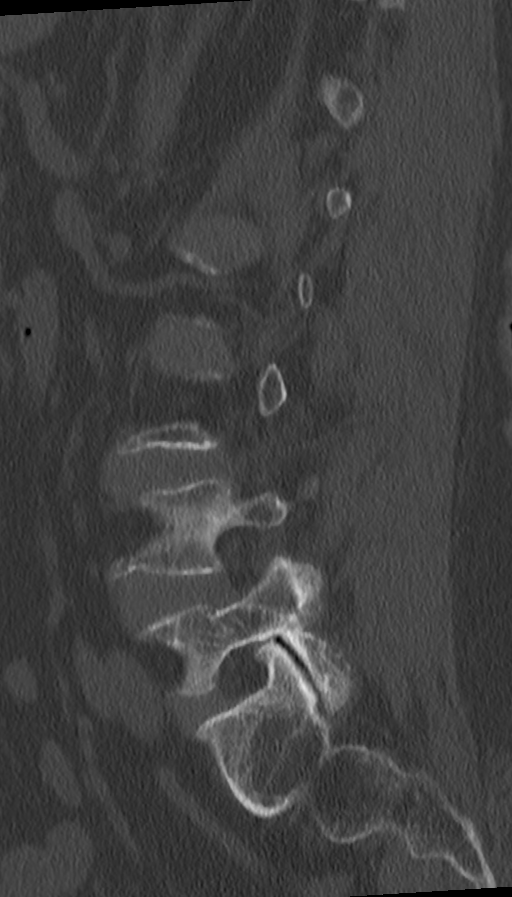

[Series 8: l-spine ax 2.00 br40 s3 · axial · 0.26mm/px · z∈[+1656,+1793]mm · 6 of 12 slices shown, 8 images]
[im 2/12  soft-tissue]
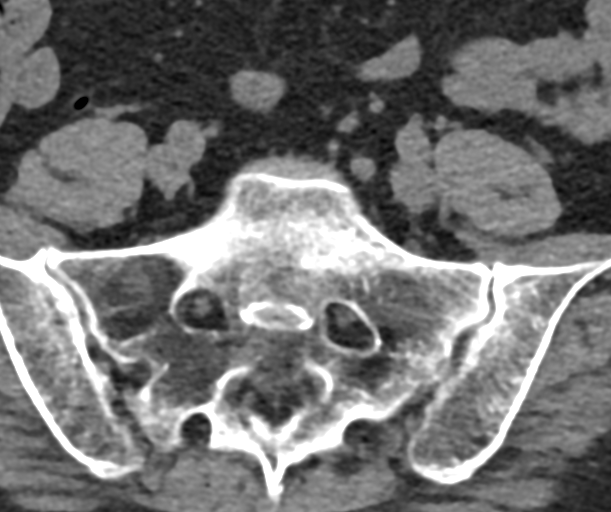
[im 2/12  bone]
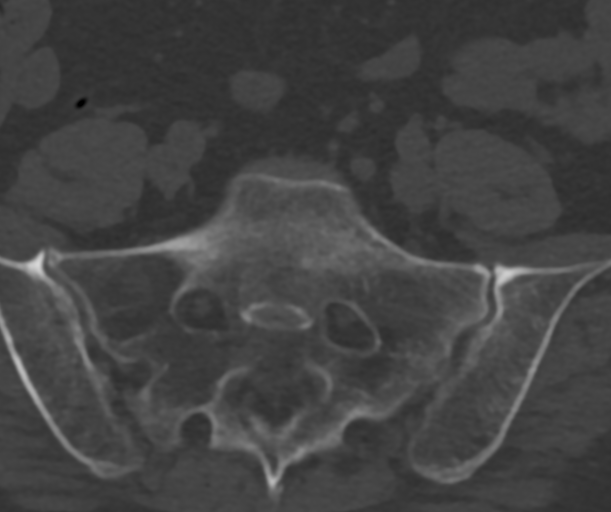
[im 4/12  bone]
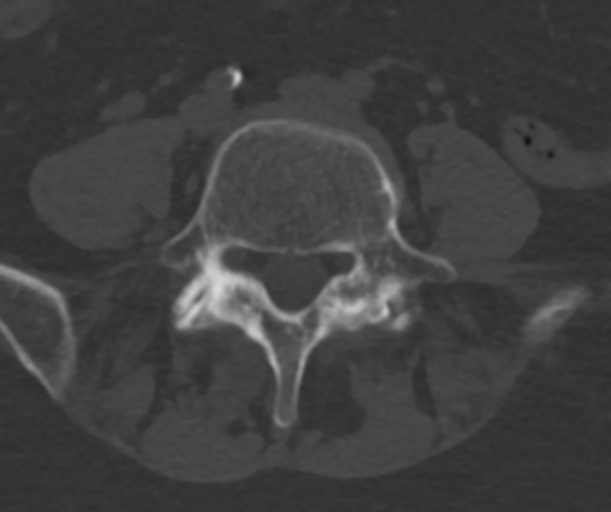
[im 5/12  bone]
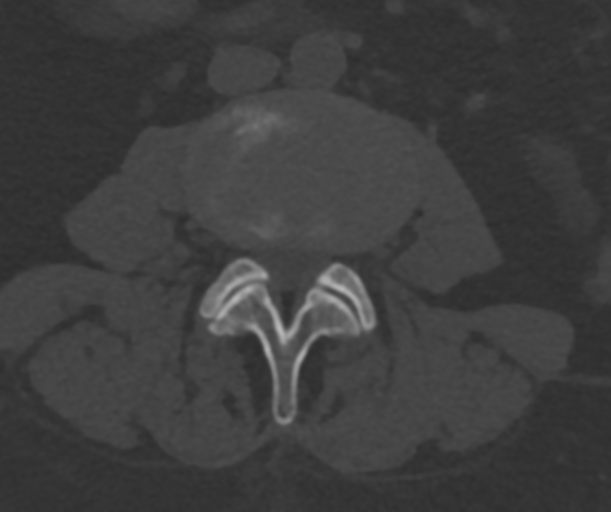
[im 7/12  bone]
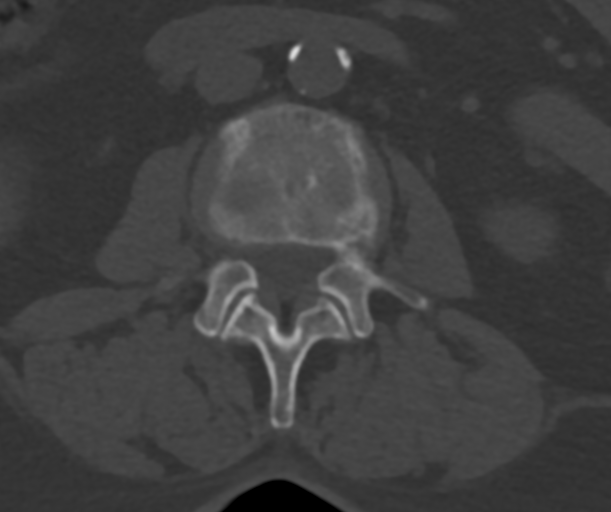
[im 8/12  soft-tissue]
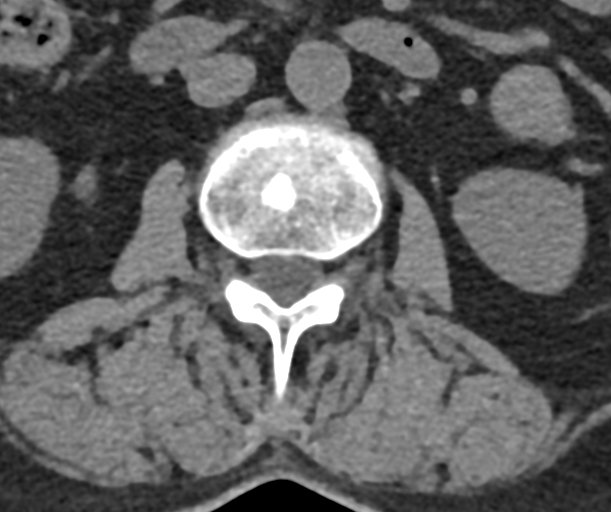
[im 8/12  bone]
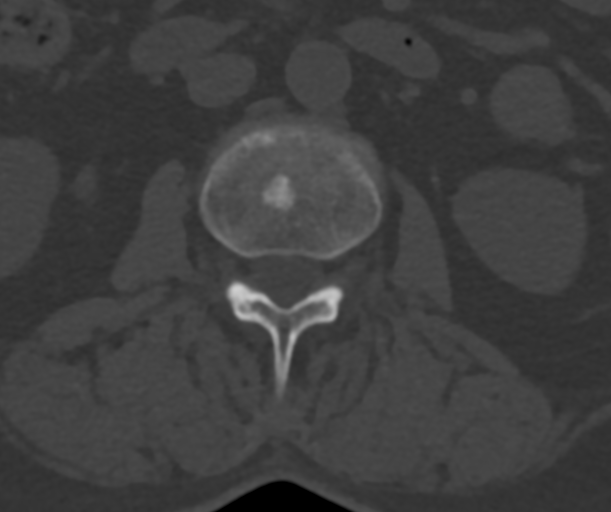
[im 10/12  bone]
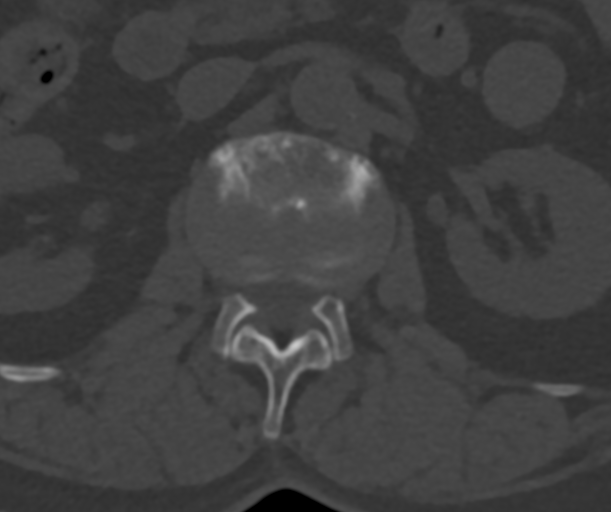

[11 of 24 positions shown; findings below may reference images not displayed]

Ordering:    CALVA, KANWAL

DIAGNOSTIC STUDIES

EXAM

CT lumbar spine without contrast.

INDICATION

BACK PAIN.
C/O LOW BACK PAIN. SURGICAL PLANNING. HB

TECHNIQUE

All CT scans at this facility use dose modulation, iterative reconstruction, and/or weight based
dosing when appropriate to reduce radiation dose to as low as reasonably achievable.

Number of previous computed tomography exams in the last 12 months is 0 .

Number of previous nuclear medicine myocardial perfusion studies in the last 12 months is 0 .

COMPARISONS

None available

FINDINGS

L1-2: Slight anterior osteophytic spurring and loss of height is seen at this level. There is disc
bulging and mild facet hypertrophy resulting in mild central canal stenosis and mild to moderate
bilateral neural foraminal stenosis.

L2-3: Disc bulging and facet hypertrophy is evident. There is no significant central canal or neural
foraminal stenosis.

L3-4: Loss of height and disc bulging is seen. In conjunction with facet and ligament flavum
hypertrophy there is mild moderate central canal narrowing and minimal bilateral neural foraminal
stenosis.

L4-5: Disc bulging and facet hypertrophy as well as ligamentum flavum hypertrophy results in
moderate to severe central canal narrowing. Moderate bilateral neural foraminal stenosis is evident.

L5-S1: There is disc bulging which is asymmetric to the right. In conjunction with facet
hypertrophy, there is moderate right and mild-to-moderate left neural foraminal stenosis.

No compression fractures are seen.

IMPRESSION

Disc bulging and facet hypertrophy throughout the lumbar spine resulting in varying degrees of
central canal and neural foraminal stenosis. Please see above discussion for individual levels. No
compression fractures are seen.

Tech Notes:

C/O LOW BACK PAIN. SURGICAL PLANNING.
HB

## 2022-08-04 ENCOUNTER — Encounter: Admit: 2022-08-04 | Discharge: 2022-08-04 | Payer: MEDICARE

## 2022-08-05 ENCOUNTER — Encounter: Admit: 2022-08-05 | Discharge: 2022-08-05 | Payer: MEDICARE

## 2022-08-05 NOTE — Telephone Encounter
Image requested from [facility] for [email or web address]

## 2022-08-10 ENCOUNTER — Encounter: Admit: 2022-08-10 | Discharge: 2022-08-10 | Payer: MEDICARE

## 2022-08-10 DIAGNOSIS — M5416 Radiculopathy, lumbar region: Secondary | ICD-10-CM

## 2022-08-25 ENCOUNTER — Ambulatory Visit: Admit: 2022-08-25 | Discharge: 2022-08-26 | Payer: MEDICARE

## 2022-08-25 ENCOUNTER — Encounter: Admit: 2022-08-25 | Discharge: 2022-08-25 | Payer: MEDICARE

## 2022-08-25 DIAGNOSIS — M5136 Other intervertebral disc degeneration, lumbar region: Secondary | ICD-10-CM

## 2022-08-25 MED ORDER — TRAMADOL 50 MG PO TAB
50 mg | ORAL_TABLET | ORAL | 1 refills | Status: AC | PRN
Start: 2022-08-25 — End: ?

## 2022-08-25 MED ORDER — CYCLOBENZAPRINE 5 MG PO TAB
5 mg | ORAL_TABLET | Freq: Three times a day (TID) | ORAL | 1 refills | 30.00000 days | Status: AC | PRN
Start: 2022-08-25 — End: ?

## 2022-08-25 NOTE — Progress Notes
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   L4 TFESI 07/'23 with >50% relief lasting 3 mo  Is undergoing PT actively now for lumbar strengthening   Vestibular therapy for balance as well          Review of Systems    Current Outpatient Medications:     albuterol sulfate (PROAIR HFA) 90 mcg/actuation HFA aerosol inhaler, Inhale two puffs by mouth into the lungs every 4 hours as needed for Wheezing or Shortness of Breath., Disp: , Rfl:     atorvastatin (LIPITOR) 20 mg tablet, Take one tablet by mouth daily., Disp: 90 tablet, Rfl: 3    benzonatate (TESSALON PERLES) 100 mg capsule, Take one capsule by mouth every 8 hours as needed for Cough., Disp: , Rfl:     CALCIUM CARBONATE (CALCIUM 500 PO), Take 1 Cap by mouth Daily., Disp: , Rfl:     Cholecalciferol (VITAMIN D3) 125 mcg (5,000 unit) capsule, Take two capsules by mouth daily., Disp: , Rfl:     cholecalciferol (vitD3)/vit K2 (VITAMIN D3-VITAMIN K2 PO), Take 2 drops by mouth daily., Disp: , Rfl:     cyclobenzaprine (FLEXERIL) 5 mg tablet, Take one tablet by mouth three times daily as needed for Muscle Cramps. Indications: muscle spasm, Disp: 90 tablet, Rfl: 1    duloxetine DR (CYMBALTA) 60 mg capsule, Take one capsule by mouth daily. Indications: neuropathic pain, Disp: 90 capsule, Rfl: 3    flecainide (TAMBOCOR) 50 mg tablet, Take one tablet by mouth twice daily., Disp: 180 tablet, Rfl: 3    furosemide (LASIX) 20 mg tablet, Take one tablet by mouth., Disp: , Rfl: hydroxychloroquine (PLAQUENIL) 200 mg tablet, Take 1.5 tablets by mouth daily. take with food, Disp: 135 tablet, Rfl: 2    leflunomide (ARAVA) 10 mg tablet, Take one tablet by mouth daily., Disp: 90 tablet, Rfl: 0    methylPREDNIsolone (MEDROL (PAK)) 4 mg tablet, Take medication as directed on package for 6 days. Take with food., Disp: 21 tablet, Rfl: 0    metoprolol succinate XL (TOPROL XL) 50 mg extended release tablet, Take one tablet by mouth daily., Disp: 90 tablet, Rfl: 3    pregabalin (LYRICA) 50 mg capsule, Take one capsule by mouth three times daily. 1 po qhs x 1 week then 1 po bid x 1 week then 1 po tid thereafter, Disp: 90 capsule, Rfl: 1    traMADoL (ULTRAM) 50 mg tablet, Take one tablet by mouth every 4 hours as needed for Pain. Indications: neuropathic pain, pain, Disp: 30 tablet, Rfl: 1  Allergies   Allergen Reactions    Sulfa (Sulfonamide Antibiotics) RASH and NAUSEA AND VOMITING     Allergy recorded in SMS: Sulfa     Physical Exam  Vitals:    08/25/22 1319   BP: 137/74   BP Source: Arm, Right Upper  Pulse: 86   SpO2: 96%   PainSc: Eight   Weight: 68 kg (150 lb)   Height: 157.5 cm (5' 2)     Oswestry Total Score:: 46  Pain Score: Eight  Body mass index is 27.44 kg/m?Marland Kitchen    General: Alert, oriented, mild distress  HEENT: normocephalic  NECK: supple  Resp: Non labored breathing, no distress  Cardio: Pedal pulses palpable bilaterally, mild lower extremity edema  MS: TTP in lumbar region of spine and paraspinal muscles.  NEURO: Cranial nerves II-XII intact. Motor strength adequate against gravity , sensory exams with def along left l4 distrib. Gait antalgic.   Behavior: Calm, cooperative, behavior and speech normal.          IMPRESSION:  1. Lumbar degenerative disc disease    2. Lumbar radiculopathy        PLAN:   Reviewed MRI   Discussed area of Deteriorative changes   Mostly lower lumbar DDD and facet changes with compression l4 and l5 lumbar TFESI l4   F.u thereafter   Consider MBB in future for back pain   Discussed next stesps to include SCS if sx not improiveing with conservative trx

## 2022-08-26 DIAGNOSIS — M5416 Radiculopathy, lumbar region: Secondary | ICD-10-CM

## 2022-09-06 ENCOUNTER — Encounter: Admit: 2022-09-06 | Discharge: 2022-09-06 | Payer: MEDICARE

## 2022-09-06 DIAGNOSIS — M3500 Sicca syndrome, unspecified: Secondary | ICD-10-CM

## 2022-09-06 DIAGNOSIS — Z79899 Other long term (current) drug therapy: Secondary | ICD-10-CM

## 2022-09-08 ENCOUNTER — Encounter: Admit: 2022-09-08 | Discharge: 2022-09-08 | Payer: MEDICARE

## 2022-09-08 MED ORDER — PREGABALIN 50 MG PO CAP
50 mg | ORAL_CAPSULE | Freq: Three times a day (TID) | ORAL | 1 refills
Start: 2022-09-08 — End: ?

## 2022-09-08 NOTE — Telephone Encounter
PA from [facility] approved for cyclobenzprine 08/25/22-11/23/22 90 tabs per 30 days

## 2022-09-08 NOTE — Telephone Encounter
Patient requesting refill of lyrica  Last Office Visit: 08/25/22  Next Office Visit: 09/22/22 Proc w/Khan  Previous script: 07/14/22    Request refill

## 2022-09-12 ENCOUNTER — Encounter: Admit: 2022-09-12 | Discharge: 2022-09-12 | Payer: MEDICARE

## 2022-09-12 NOTE — Telephone Encounter
Discussed stress test instructions and confirmed d/t/l of test with patient. Patient does not have any questions or concerns at this time.

## 2022-09-14 ENCOUNTER — Ambulatory Visit: Admit: 2022-09-14 | Discharge: 2022-09-14 | Payer: MEDICARE

## 2022-09-14 ENCOUNTER — Encounter: Admit: 2022-09-14 | Discharge: 2022-09-14 | Payer: MEDICARE

## 2022-09-14 DIAGNOSIS — K219 Gastro-esophageal reflux disease without esophagitis: Secondary | ICD-10-CM

## 2022-09-14 DIAGNOSIS — G909 Disorder of the autonomic nervous system, unspecified: Secondary | ICD-10-CM

## 2022-09-14 DIAGNOSIS — I471 PSVT (paroxysmal supraventricular tachycardia) (HCC): Secondary | ICD-10-CM

## 2022-09-14 DIAGNOSIS — I447 Left bundle-branch block, unspecified: Secondary | ICD-10-CM

## 2022-09-14 DIAGNOSIS — M545 Chronic bilateral low back pain without sciatica: Secondary | ICD-10-CM

## 2022-09-14 MED ORDER — REGADENOSON 0.4 MG/5 ML IV SYRG
.4 mg | Freq: Once | INTRAVENOUS | 0 refills | Status: CP
Start: 2022-09-14 — End: ?

## 2022-09-14 MED ORDER — PERFLUTREN LIPID MICROSPHERES 1.1 MG/ML IV SUSP
1-10 mL | Freq: Once | INTRAVENOUS | 0 refills | Status: CP | PRN
Start: 2022-09-14 — End: ?

## 2022-09-14 MED ORDER — NITROGLYCERIN 0.4 MG SL SUBL
.4 mg | SUBLINGUAL | 0 refills | Status: AC | PRN
Start: 2022-09-14 — End: ?

## 2022-09-14 MED ORDER — SODIUM CHLORIDE 0.9 % IJ SOLN
10 mL | Freq: Once | INTRAVENOUS | 0 refills | Status: CP
Start: 2022-09-14 — End: ?

## 2022-09-14 MED ORDER — AMINOPHYLLINE 25 MG/ML IV SOLN
50 mg | INTRAVENOUS | 0 refills | Status: AC | PRN
Start: 2022-09-14 — End: ?

## 2022-09-14 MED ORDER — RP DX TC-99M TETROFOSMIN MCI
24 | Freq: Once | INTRAVENOUS | 0 refills | Status: CP
Start: 2022-09-14 — End: ?

## 2022-09-14 MED ORDER — SODIUM CHLORIDE 0.9 % IV SOLP
250 mL | INTRAVENOUS | 0 refills | Status: AC | PRN
Start: 2022-09-14 — End: ?

## 2022-09-14 MED ORDER — ALBUTEROL SULFATE 90 MCG/ACTUATION IN HFAA
2 | RESPIRATORY_TRACT | 0 refills | Status: AC | PRN
Start: 2022-09-14 — End: ?

## 2022-09-14 MED ORDER — RP DX TC-99M TETROFOSMIN MCI
8 | Freq: Once | INTRAVENOUS | 0 refills | Status: CP
Start: 2022-09-14 — End: ?

## 2022-09-14 MED ORDER — EUCALYPTUS-MENTHOL MM LOZG
1 | Freq: Once | ORAL | 0 refills | Status: AC | PRN
Start: 2022-09-14 — End: ?

## 2022-09-15 ENCOUNTER — Encounter: Admit: 2022-09-15 | Discharge: 2022-09-15 | Payer: MEDICARE

## 2022-09-16 ENCOUNTER — Encounter: Admit: 2022-09-16 | Discharge: 2022-09-16 | Payer: MEDICARE

## 2022-09-16 NOTE — Telephone Encounter
Discussed stress test with pt.  Will review with Dr Barry Dienes and call if any other recommendations.

## 2022-09-16 NOTE — Telephone Encounter
-----   Message from [place] H sent at 09/16/2022  9:10 AM CDT -----  Regarding: FW: Stress and echo test results   Contact: [phone]    ----- Message -----  From: Vena Austria  Sent: 09/16/2022   8:51 AM CDT  To: [facility] Nurse Triage [place]  Subject: Stress and echo test results                     This is Kristen Norris [birth year] . I know Dr. Barry Dienes is on vacation but wondered if an associate would call me to explain my test results , thank you

## 2022-09-20 ENCOUNTER — Encounter: Admit: 2022-09-20 | Discharge: 2022-09-20 | Payer: MEDICARE | Primary: General Practice

## 2022-09-20 DIAGNOSIS — I209 Angina pectoris, unspecified: Secondary | ICD-10-CM

## 2022-09-20 DIAGNOSIS — R9439 Abnormal result of other cardiovascular function study: Secondary | ICD-10-CM

## 2022-09-20 MED ORDER — ASPIRIN 325 MG PO TAB
325 mg | Freq: Once | ORAL | 0 refills
Start: 2022-09-20 — End: ?

## 2022-09-21 ENCOUNTER — Encounter: Admit: 2022-09-21 | Discharge: 2022-09-21 | Payer: MEDICARE | Primary: General Practice

## 2022-09-21 NOTE — Progress Notes
Medicare is listed as patient's primary insurance coverage.  Pre-certification is not required for hospitalizations.

## 2022-09-21 NOTE — Telephone Encounter
-----   Message from Jonelle Sports, MD sent at 09/20/2022  1:43 PM CDT -----  I think we'd better set [name] up for cors/possible given the abnormalities on the stress test.  [facility] would you please ask her if she'd be willing to schedule?  Thanks.    Cc:  Sherene Sires

## 2022-09-22 ENCOUNTER — Encounter: Admit: 2022-09-22 | Discharge: 2022-09-22 | Payer: MEDICARE | Primary: General Practice

## 2022-09-22 ENCOUNTER — Ambulatory Visit: Admit: 2022-09-22 | Discharge: 2022-09-23 | Payer: MEDICARE | Primary: General Practice

## 2022-09-22 DIAGNOSIS — H547 Unspecified visual loss: Secondary | ICD-10-CM

## 2022-09-22 DIAGNOSIS — I10 Essential (primary) hypertension: Secondary | ICD-10-CM

## 2022-09-22 DIAGNOSIS — M2550 Pain in unspecified joint: Secondary | ICD-10-CM

## 2022-09-22 DIAGNOSIS — S2231XA Fracture of one rib, right side, initial encounter for closed fracture: Secondary | ICD-10-CM

## 2022-09-22 DIAGNOSIS — M5136 Other intervertebral disc degeneration, lumbar region: Secondary | ICD-10-CM

## 2022-09-22 DIAGNOSIS — M5030 Other cervical disc degeneration, unspecified cervical region: Secondary | ICD-10-CM

## 2022-09-22 DIAGNOSIS — I499 Cardiac arrhythmia, unspecified: Secondary | ICD-10-CM

## 2022-09-22 DIAGNOSIS — M810 Age-related osteoporosis without current pathological fracture: Secondary | ICD-10-CM

## 2022-09-22 DIAGNOSIS — E782 Mixed hyperlipidemia: Secondary | ICD-10-CM

## 2022-09-22 DIAGNOSIS — M5416 Radiculopathy, lumbar region: Secondary | ICD-10-CM

## 2022-09-22 DIAGNOSIS — I4892 Unspecified atrial flutter: Secondary | ICD-10-CM

## 2022-09-22 DIAGNOSIS — I38 Endocarditis, valve unspecified: Secondary | ICD-10-CM

## 2022-09-22 DIAGNOSIS — U071 COVID-19: Secondary | ICD-10-CM

## 2022-09-22 DIAGNOSIS — E559 Vitamin D deficiency, unspecified: Secondary | ICD-10-CM

## 2022-09-22 DIAGNOSIS — W19XXXA Unspecified fall, initial encounter: Secondary | ICD-10-CM

## 2022-09-22 DIAGNOSIS — R42 Dizziness and giddiness: Secondary | ICD-10-CM

## 2022-09-22 DIAGNOSIS — Z923 Personal history of irradiation: Secondary | ICD-10-CM

## 2022-09-22 DIAGNOSIS — F32A Depression: Secondary | ICD-10-CM

## 2022-09-22 DIAGNOSIS — G629 Polyneuropathy, unspecified: Secondary | ICD-10-CM

## 2022-09-22 DIAGNOSIS — E785 Hyperlipidemia, unspecified: Secondary | ICD-10-CM

## 2022-09-22 DIAGNOSIS — Z853 Personal history of malignant neoplasm of breast: Secondary | ICD-10-CM

## 2022-09-22 DIAGNOSIS — C50212 Malignant neoplasm of upper-inner quadrant of left female breast: Secondary | ICD-10-CM

## 2022-09-22 DIAGNOSIS — N3020 Other chronic cystitis without hematuria: Secondary | ICD-10-CM

## 2022-09-22 DIAGNOSIS — Z1379 Encounter for other screening for genetic and chromosomal anomalies: Secondary | ICD-10-CM

## 2022-09-22 DIAGNOSIS — E162 Hypoglycemia, unspecified: Secondary | ICD-10-CM

## 2022-09-22 DIAGNOSIS — C801 Malignant (primary) neoplasm, unspecified: Secondary | ICD-10-CM

## 2022-09-22 DIAGNOSIS — M549 Dorsalgia, unspecified: Secondary | ICD-10-CM

## 2022-09-22 DIAGNOSIS — K219 Gastro-esophageal reflux disease without esophagitis: Secondary | ICD-10-CM

## 2022-09-22 DIAGNOSIS — I8390 Asymptomatic varicose veins of unspecified lower extremity: Secondary | ICD-10-CM

## 2022-09-22 DIAGNOSIS — Z9221 Personal history of antineoplastic chemotherapy: Secondary | ICD-10-CM

## 2022-09-22 DIAGNOSIS — Z9109 Other allergy status, other than to drugs and biological substances: Secondary | ICD-10-CM

## 2022-09-22 DIAGNOSIS — M3500 Sicca syndrome, unspecified: Secondary | ICD-10-CM

## 2022-09-22 DIAGNOSIS — M1990 Unspecified osteoarthritis, unspecified site: Secondary | ICD-10-CM

## 2022-09-22 MED ORDER — IOHEXOL 240 MG IODINE/ML IV SOLN
1 mL | Freq: Once | EPIDURAL | 0 refills | Status: CP
Start: 2022-09-22 — End: ?

## 2022-09-22 MED ORDER — METHYLPREDNISOLONE ACETATE 80 MG/ML IJ SUSP
80 mg | Freq: Once | INTRAMUSCULAR | 0 refills | Status: CP
Start: 2022-09-22 — End: ?

## 2022-09-22 NOTE — Procedures
Attending Surgeon: Jerilynn Som, MD    Anesthesia: Local    Pre-Procedure Diagnosis:   1. Lumbar degenerative disc disease    2. Lumbar radiculopathy        Post-Procedure Diagnosis:   1. Lumbar degenerative disc disease    2. Lumbar radiculopathy        [facility] SPINE TRANSFORAMINAL LUMBAR/SACRAL  Procedure: transforaminal epidural    Laterality: left   on 09/22/2022 11:00 AM  Location: lumbar - L4-5 and L5-S1      Consent:   Consent obtained: written  Consent given by: patient    Discussed with patient the purpose of the treatment/procedure, other ways of treating my condition, including no treatment/ procedure and the risks and benefits of the alternatives. Patient has decided to proceed with treatment/procedure.   Procedures Details:   Indications: pain and diagnostic evaluation   Prep: chlorhexidine  Patient position: prone  Estimated Blood Loss: minimal  Specimens: none  Number of Levels: 2  Approach: left paramedian  Guidance: fluoroscopy  Contrast: Procedure confirmed with contrast under live fluoroscopy.  Needle and Epidural Catheter: pencil-tip  Needle size: 25 G  Injection procedure: Incremental injection, Negative aspiration for blood and Introduced needle  Patient tolerance: Patient tolerated the procedure well with no immediate complications. Pressure was applied, and hemostasis was accomplished.  Outcome: Pain relieved and Pain improved  Comments: 1ml lido and 40mg  depomedrol inj at each level    This patient's clinical history, exam, AND imaging support radiculopathy AND there is a significant impact on quality of life and function AND the pain has been present for at least 4 weeks AND they have failed to improve with noninvasive conservative care.  This patient had at least 50% pain relief for at least 3 months with the last epidural injection.      Estimated blood loss: none or minimal  Specimens: none  Patient tolerated the procedure well with no immediate complications. Pressure was applied, and hemostasis was accomplished.

## 2022-09-22 NOTE — Progress Notes
[facility]  INTERVENTIONAL PAIN PROCEDURE HISTORY AND PHYSICAL    Chief Complaint: Pain    HISTORY OF PRESENT ILLNESS:  pain    Past Medical History:   Diagnosis Date    Accidental fall 2022    Arthritis     Atrial flutter (HCC) 2020    Back pain 07/18/2007    Back pain     Bladder infection, chronic 2017    Cardiac dysrhythmia 2020    Chest pain     COVID-19 12/28/2018    Degenerative disc disease, cervical     Degenerative disc disease, lumbar     Lo    Depression     Dizziness 2020    more often    Elevated triglycerides with high cholesterol     Environmental allergies     Essential hypertension     When in traffic    Genetic 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    Left; Stage IIA, T2N0M0, ER positive, Her-2 negative    Neuropathy     Osteoporosis 03/2007    BMD  T-score (-2.3) spine and (-2.9) femur on Actonel    Other malignant neoplasm without specification of site     Personal history of irradiation     Personal history of malignant neoplasm of breast     PN (Peripheral Neuropathy)     Right rib fracture 06/02/2021    chronic appearing right fourth, fifth and sixth rib fractures    Sjogren's disease (HCC)     Valvular heart disease 2020    Varicose veins 2019    Vision decreased     Vitamin D deficiency 07/2007       Surgical History:   Procedure Laterality Date    CARDIAC CATHERIZATION  03/28/2005    LAD 20%, Circ ostial 30%, EF 65%    HX BREAST LUMPECTOMY  06/07/2005    Left with SLNBX (0/2)    BREAST BIOPSY Left 06/07/2005    Malignant    COLONOSCOPY N/A 03/01/2010    normal ([name], MD-[facility])    HX HYSTERECTOMY  03/24/2011    [name], MD [facility]    BREAST BIOPSY Left 02/07/2014    Benign    Excision Posterior Neck mass N/A 09/04/2020    Performed by Flo Shanks [name]., MD at [facility] OR    anterior colporrhaphy (cystocele repair);  Posterior colporrhaphy (rectocele repair); N/A 11/13/2020    Performed by [name], MD at [facility] OR    CYSTOURETHROSCOPY N/A 11/13/2020    Performed by [name], MD at [facility] OR    *Painteq Sacroiliac Joint LinQ* PERCUTANEOUS/ MINIMALLY INVASIVE ARTHODESIS SACROILIAC JOINT WITH IMAGE GUIDANCE - WITH OR WITHOUT BONE GRAFT/ TRANSFIXING DEVICE  27279- [id number] Sacroiliac joint fusion via posterer approach Left 10/12/2021    Performed by Bella Kennedy, MD at [facility] OR    CARDIOVASCULAR STRESS TEST  ?    HX BLADDER SUSPENSION      HX HEART CATHETERIZATION  2007    HX SALPINGO-OOPHORECTOMY  HX TONSILLECTOMY  1979    HX TUBAL LIGATION      KNEE SURGERY  ]999    LYMPH NODE BIOPSY      PR LAPAROSCOPY SURG RPR INITIAL INGUINAL HERNIA  1982    TUNNELED VENOUS PORT PLACEMENT         family history includes Arthritis in her father; Brain Tumor (age of onset: 76) in an other family member; COPD in her daughter; Cancer in her daughter; Essie Christine (age of onset: 62) in her paternal cousin; Cancer-Breast (age of onset: 30) in her paternal cousin; Cancer-Breast (age of onset: 80) in her paternal aunt; Cancer-Breast (age of onset: 66) in an other family member; Cancer-Hematologic (age of onset: 75) in her daughter; Teresa Coombs (age of onset: 74) in her paternal uncle; Cancer-Lung (age of onset: 12) in an other family member; Cancer-Ovarian (age of onset: 75) in her paternal cousin; Cancer-Ovarian (age of onset: 68) in her daughter; Complete Hysterectomy in her sister; Complete Hysterectomy (age of onset: 15) in her daughter; Coronary Artery Disease in her father; Depression (age of onset: 6) in her brother; Diabetes in her mother; Diabetes Type II in her maternal grandmother; Heart Attack (age of onset: 55) in her brother; Heart Disease in her father, mother, and sister; Heart murmur in her sister; Hypoglycemia in her mother; None Reported in her grandchild, grandchild, grandchild, grandchild, grandchild, paternal cousin, and sister; Osteopenia in her sister; Other in her grandchild; Other (age of onset: 0) in her brother; Other (age of onset: 19) in her brother; Other (age of onset: 21) in her sister; Tobacco disorder in her daughter.    Social History     Socioeconomic History    Marital status: Divorced    Number of children: 2    Highest education level: GED or equivalent   Occupational History    Occupation: grounds Human resources officer: [name]   Tobacco Use    Smoking status: Former     Current packs/day: 0.00     Average packs/day: 1 pack/day for 14.0 years (14.0 ttl pk-yrs)     Types: Cigarettes     Start date: 04/08/1986     Quit date: 04/07/2000     Years since quitting: 22.4    Smokeless tobacco: Never   Vaping Use    Vaping status: Never Used   Substance and Sexual Activity    Alcohol use: Not Currently    Drug use: No    Sexual activity: Not Currently     Partners: Male       Allergies   Allergen Reactions    Sulfa (Sulfonamide Antibiotics) RASH and NAUSEA AND VOMITING     Allergy recorded in SMS: Sulfa       Vitals:    09/22/22 1058   BP: (!) 141/88   BP Source: Arm, Right Upper   Pulse: 65   Temp: 36.6 ?C (97.9 ?F)   Resp: 16   SpO2: 95%   TempSrc: Oral   PainSc: Six   Weight: 68 kg (150 lb)   Height: 158.8 cm (5' 2.5)     Pain Score: Six  Oswestry Total Score:: 48    REVIEW OF SYSTEMS: 10 point ROS obtained and negative except pain      PHYSICAL EXAM:unchnaged        IMPRESSION:    1. Lumbar degenerative disc disease    2. Lumbar radiculopathy         PLAN: Lumbar Transforaminal Steroid Injection

## 2022-09-22 NOTE — Patient Instructions
Procedure Completed Today: Lumbar Transforaminal Steroid Injection    Important information following your procedure today: You may drive today    Pain relief may not be immediate. It is possible you may even experience an increase in pain during the first 24-48 hours followed by a gradual decrease of your pain.  Though the procedure is generally safe and complications are rare, we do ask that you be aware of any of the following:   Any swelling, persistent redness, new bleeding, or drainage from the site of the injection.  You should not experience a severe headache.  You should not run a fever over 101? F.  New onset of sharp, severe back & or neck pain.  New onset of upper or lower extremity numbness or weakness.  New difficulty controlling bowel or bladder function after the injection.  New shortness of breath.    If any of these occur, please call to report this occurrence to Dr. Khan at [phone]. If you are calling after 4:00 p.m., on weekends or holidays please call [phone] and ask to have the resident physician on call for the physician paged or go to your local emergency room.  You may experience soreness at the injection site. Ice can be applied at 20 minute intervals. Avoid application of direct heat, hot showers or hot tubs today.  Avoid strenuous activity today. You may resume your regular activities and exercise tomorrow.  Patients with diabetes may see an elevation in blood sugars for 7-10 days after the injection. It is important to pay close attention to your diet, check your blood sugars daily and report extreme elevations to the physician that treats your diabetes.  Patients taking a daily blood thinner can resume their regular dose this evening.  It is important that you take all medications ordered by your pain physician. Taking medication as ordered is an important part of your pain care plan. If you cannot continue the medication plan, please notify the physician.     Possible side effects to steroids that may occur:  Flushing or redness of the face  Irritability  Fluid retention  Change in women?s menses    The following medications were used: Lidocaine , Depomedrol, and Contrast Dye

## 2022-09-22 NOTE — Progress Notes
Pain Procedure Plan Of Care    Risk of injury related to procedure  Patient identification, allergies verified, fall precautions implemented    Risk of injury and impaired skin integrity  Positioning devices applied as appropriate for procedure, patient transported with staff assistance    Management of Pain  Pain assessment completed on arrival, PAR scoring following procedure and at discharge, sedation administered as ordered, patient positioned for comfort    Risk of anxiety related to procedure and disease process  Patient education on procedure and expectations, provide coping support to patient, provide relaxation techniques    Outcomes:  The patient is free of injury during and following their procedure.  Skin is intact and free of bruising.  The patients pain is managed during their stay.  Alleviation of patient anxiety exhibited.

## 2022-09-23 ENCOUNTER — Encounter: Admit: 2022-09-23 | Discharge: 2022-09-23 | Payer: MEDICARE | Primary: General Practice

## 2022-09-23 DIAGNOSIS — K219 Gastro-esophageal reflux disease without esophagitis: Secondary | ICD-10-CM

## 2022-09-23 DIAGNOSIS — Z882 Allergy status to sulfonamides status: Secondary | ICD-10-CM

## 2022-09-23 DIAGNOSIS — Z8249 Family history of ischemic heart disease and other diseases of the circulatory system: Secondary | ICD-10-CM

## 2022-09-23 DIAGNOSIS — Z8041 Family history of malignant neoplasm of ovary: Secondary | ICD-10-CM

## 2022-09-23 DIAGNOSIS — Z853 Personal history of malignant neoplasm of breast: Secondary | ICD-10-CM

## 2022-09-23 DIAGNOSIS — E559 Vitamin D deficiency, unspecified: Secondary | ICD-10-CM

## 2022-09-23 DIAGNOSIS — Z8616 Personal history of COVID-19: Secondary | ICD-10-CM

## 2022-09-23 DIAGNOSIS — M3500 Sicca syndrome, unspecified: Secondary | ICD-10-CM

## 2022-09-23 DIAGNOSIS — M5116 Intervertebral disc disorders with radiculopathy, lumbar region: Secondary | ICD-10-CM

## 2022-09-23 DIAGNOSIS — M810 Age-related osteoporosis without current pathological fracture: Secondary | ICD-10-CM

## 2022-09-23 DIAGNOSIS — Z833 Family history of diabetes mellitus: Secondary | ICD-10-CM

## 2022-09-23 DIAGNOSIS — Z90722 Acquired absence of ovaries, bilateral: Secondary | ICD-10-CM

## 2022-09-23 DIAGNOSIS — Z90710 Acquired absence of both cervix and uterus: Secondary | ICD-10-CM

## 2022-09-23 DIAGNOSIS — Z87891 Personal history of nicotine dependence: Secondary | ICD-10-CM

## 2022-09-27 ENCOUNTER — Encounter: Admit: 2022-09-27 | Discharge: 2022-09-27 | Payer: MEDICARE | Primary: General Practice

## 2022-09-27 ENCOUNTER — Ambulatory Visit: Admit: 2022-09-27 | Discharge: 2022-09-28 | Payer: MEDICARE | Primary: General Practice

## 2022-09-27 DIAGNOSIS — F32A Depression: Secondary | ICD-10-CM

## 2022-09-27 DIAGNOSIS — I471 PSVT (paroxysmal supraventricular tachycardia) (HCC): Secondary | ICD-10-CM

## 2022-09-27 DIAGNOSIS — U071 COVID-19: Secondary | ICD-10-CM

## 2022-09-27 DIAGNOSIS — I447 Left bundle-branch block, unspecified: Secondary | ICD-10-CM

## 2022-09-27 DIAGNOSIS — C801 Malignant (primary) neoplasm, unspecified: Secondary | ICD-10-CM

## 2022-09-27 DIAGNOSIS — S2231XA Fracture of one rib, right side, initial encounter for closed fracture: Secondary | ICD-10-CM

## 2022-09-27 DIAGNOSIS — Z1379 Encounter for other screening for genetic and chromosomal anomalies: Secondary | ICD-10-CM

## 2022-09-27 DIAGNOSIS — C50212 Malignant neoplasm of upper-inner quadrant of left female breast: Secondary | ICD-10-CM

## 2022-09-27 DIAGNOSIS — Z853 Personal history of malignant neoplasm of breast: Secondary | ICD-10-CM

## 2022-09-27 DIAGNOSIS — G909 Disorder of the autonomic nervous system, unspecified: Secondary | ICD-10-CM

## 2022-09-27 DIAGNOSIS — E559 Vitamin D deficiency, unspecified: Secondary | ICD-10-CM

## 2022-09-27 DIAGNOSIS — Z923 Personal history of irradiation: Secondary | ICD-10-CM

## 2022-09-27 DIAGNOSIS — W19XXXA Unspecified fall, initial encounter: Secondary | ICD-10-CM

## 2022-09-27 DIAGNOSIS — E162 Hypoglycemia, unspecified: Secondary | ICD-10-CM

## 2022-09-27 DIAGNOSIS — M5136 Other intervertebral disc degeneration, lumbar region: Secondary | ICD-10-CM

## 2022-09-27 DIAGNOSIS — I499 Cardiac arrhythmia, unspecified: Secondary | ICD-10-CM

## 2022-09-27 DIAGNOSIS — H547 Unspecified visual loss: Secondary | ICD-10-CM

## 2022-09-27 DIAGNOSIS — M810 Age-related osteoporosis without current pathological fracture: Secondary | ICD-10-CM

## 2022-09-27 DIAGNOSIS — G629 Polyneuropathy, unspecified: Secondary | ICD-10-CM

## 2022-09-27 DIAGNOSIS — M5030 Other cervical disc degeneration, unspecified cervical region: Secondary | ICD-10-CM

## 2022-09-27 DIAGNOSIS — R42 Dizziness and giddiness: Secondary | ICD-10-CM

## 2022-09-27 DIAGNOSIS — Z9221 Personal history of antineoplastic chemotherapy: Secondary | ICD-10-CM

## 2022-09-27 DIAGNOSIS — E785 Hyperlipidemia, unspecified: Secondary | ICD-10-CM

## 2022-09-27 DIAGNOSIS — I8390 Asymptomatic varicose veins of unspecified lower extremity: Secondary | ICD-10-CM

## 2022-09-27 DIAGNOSIS — Z136 Encounter for screening for cardiovascular disorders: Secondary | ICD-10-CM

## 2022-09-27 DIAGNOSIS — M3500 Sicca syndrome, unspecified: Secondary | ICD-10-CM

## 2022-09-27 DIAGNOSIS — I2089 Stable angina pectoris (HCC): Secondary | ICD-10-CM

## 2022-09-27 DIAGNOSIS — K219 Gastro-esophageal reflux disease without esophagitis: Secondary | ICD-10-CM

## 2022-09-27 DIAGNOSIS — I10 Essential (primary) hypertension: Secondary | ICD-10-CM

## 2022-09-27 DIAGNOSIS — I38 Endocarditis, valve unspecified: Secondary | ICD-10-CM

## 2022-09-27 DIAGNOSIS — N3020 Other chronic cystitis without hematuria: Secondary | ICD-10-CM

## 2022-09-27 DIAGNOSIS — R739 Hyperglycemia, unspecified: Secondary | ICD-10-CM

## 2022-09-27 DIAGNOSIS — E782 Mixed hyperlipidemia: Secondary | ICD-10-CM

## 2022-09-27 DIAGNOSIS — M1990 Unspecified osteoarthritis, unspecified site: Secondary | ICD-10-CM

## 2022-09-27 DIAGNOSIS — Z9109 Other allergy status, other than to drugs and biological substances: Secondary | ICD-10-CM

## 2022-09-27 DIAGNOSIS — R600 Localized edema: Secondary | ICD-10-CM

## 2022-09-27 DIAGNOSIS — I4892 Unspecified atrial flutter: Secondary | ICD-10-CM

## 2022-09-27 DIAGNOSIS — M549 Dorsalgia, unspecified: Secondary | ICD-10-CM

## 2022-09-27 DIAGNOSIS — M2550 Pain in unspecified joint: Secondary | ICD-10-CM

## 2022-09-27 LAB — CBC
HEMATOCRIT: 47 — ABNORMAL HIGH (ref 34.1–44.9)
HEMOGLOBIN: 15
MCH: 28
MCHC: 32 — ABNORMAL LOW (ref 32.2–35.5)
MCV: 89
MPV: 10
PLATELET COUNT: 254
RBC COUNT: 5.3 — ABNORMAL HIGH (ref 3.93–5.22)
WBC COUNT: 5.9

## 2022-09-27 LAB — BASIC METABOLIC PANEL
BLD UREA NITROGEN: 28 — ABNORMAL HIGH (ref 9.8–20.1)
CHLORIDE: 106
CO2: 25
CREATININE: 0.8
GLUCOSE,PANEL: 76
POTASSIUM: 4.8
SODIUM: 141

## 2022-09-27 MED ORDER — ASPIRIN 81 MG PO CHEW
81 mg | Freq: Every day | ORAL | 0 refills | Status: AC
Start: 2022-09-27 — End: ?

## 2022-09-27 MED ORDER — NITROGLYCERIN 0.4 MG SL SUBL
ORAL_TABLET | SUBLINGUAL | 3 refills | 9.00000 days | Status: AC
Start: 2022-09-27 — End: ?

## 2022-09-27 NOTE — Progress Notes
Date of Service: 09/27/2022    Patient: Kristen Norris; ZOX:[id number]; DOB: May 28, 1950  Her PCP is Rosalia Hammers.    Referring physician:Cusumano, Marcelino Duster Fnp-*         Subjective:  Kristen Norris is a 72 y.o. female who presents to the advanced heart failure clinic for Follow-up visit.  She follows with Dr. Barry Dienes.  She presents for updating HPI prior to the left heart cath procedure on 10/07/22.     She has past medical history of left bundle branch block, autonomic dysfunction, paroxysmal supraventricular tachycardia, GERD, esophageal dysphagia, chest pain, lower back pain due to lumbar degenerative disc disease, left breast cancer status postlumpectomy in 2007 and after that received chemo plus radiation therapy.    Today, patient denies any chest pain currently.  Patient mentioned that her initial episode of chest pressure was approximately 3 months ago when she felt funny at the center of the chest.  She was outside of [employer] and she took 2-3 doses of full-strength aspirin and the chest pain got better.  Since then she continues to have intermittent chest pressure and has been seen by my partner Dr. Barry Dienes.  She then underwent MPI stress test which was abnormal and hence she is scheduled to undergo coronary angiogram.  She continues to struggle with shortness of breath on exertion.  She continues to have chest pain intermittently.  The chest pain feels like chest pressure and goes to her left arm.  The chest pain gets worse while she is doing some physical labor.  Today she has no chest pain.  She does also have lower back pain for which she received steroid injection last week.    Of note, patient with history of joint swellings which got better after she started leflunomide approximately 3-4 months ago.  She is compliant with her medications.    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    Left; Stage IIA, T2N0M0, ER positive, Her-2 negative    Neuropathy     Osteoporosis 03/2007    BMD  T-score (-2.3) spine and (-2.9) femur on Actonel    Other malignant neoplasm without specification of site     Personal history of irradiation  Personal history of malignant neoplasm of breast     PN (Peripheral Neuropathy)     Right rib fracture 06/02/2021    chronic appearing right fourth, fifth and sixth rib fractures    Sjogren's disease (HCC)     Valvular heart disease 2020    Varicose veins 2019    Vision decreased     Vitamin D deficiency 07/2007          Surgical History:   Procedure Laterality Date    CARDIAC CATHERIZATION  03/28/2005    LAD 20%, Circ ostial 30%, EF 65%    HX BREAST LUMPECTOMY  06/07/2005    Left with SLNBX (0/2)    BREAST BIOPSY Left 06/07/2005    Malignant    COLONOSCOPY N/A 03/01/2010    normal ([name], MD-[facility])    HX HYSTERECTOMY  03/24/2011    [name], MD [facility]/BSO    BREAST BIOPSY Left 02/07/2014    Benign    Excision Posterior Neck mass N/A 09/04/2020    Performed by Flo Shanks [name]., MD at [facility] OR    anterior colporrhaphy (cystocele repair);  Posterior colporrhaphy (rectocele repair); N/A 11/13/2020    Performed by [name], MD at [facility] OR    CYSTOURETHROSCOPY N/A 11/13/2020    Performed by [name], MD at [facility] OR    *Painteq Sacroiliac Joint LinQ* PERCUTANEOUS/ MINIMALLY INVASIVE ARTHODESIS SACROILIAC JOINT WITH IMAGE GUIDANCE - WITH OR WITHOUT BONE GRAFT/ TRANSFIXING DEVICE  27279- [id number] Sacroiliac joint fusion via posterer approach Left 10/12/2021    Performed by Bella Kennedy, MD at [facility] OR    CARDIOVASCULAR STRESS TEST  ?    HX BLADDER SUSPENSION      HX HEART CATHETERIZATION  2007    HX SALPINGO-OOPHORECTOMY      HX TONSILLECTOMY  1979    HX TUBAL LIGATION      KNEE SURGERY  ]999    LYMPH NODE BIOPSY      PR LAPAROSCOPY SURG RPR INITIAL INGUINAL HERNIA  1982    TUNNELED VENOUS PORT PLACEMENT          Family History:  Noncontributory    Social History:  She lives with her great grandkids.  Creatinine today is 27 years old.  She is a prior history of smoking for approximately 10 years.  She quit smoking in 2002.  Denies any current alcohol intake.  No illicit drugs.    Review of Systems:  General: Denies fever, chills, malaise, night sweats, significant weight change  HEENT: negative for dry/itchy eyes, congestion, sore throat  Pulm: negative for cough, hemoptysis, and shortness of breath at rest  CV: as per HPI  GI: negative for N/V, abdominal pain, blood in stools, constipation, diarrhea  GU: negative for dysuria, hematuria  MSK: negative for joint pain, joint swelling, muscle pain/weakness  Neuro: negative for headaches, numbness/tingling, seizures, tremors  Heme/Lymph: negative for bleeding problems, bruising  Derm: No rashes, dry skin, or skin lesions    Medications:    Current Outpatient Medications:     albuterol sulfate (PROAIR HFA) 90 mcg/actuation HFA aerosol inhaler, Inhale two puffs by mouth into the lungs every 4 hours as needed for Wheezing or Shortness of Breath., Disp: , Rfl: atorvastatin (LIPITOR) 20 mg tablet, Take one tablet by mouth daily., Disp: 90 tablet, Rfl: 3    benzonatate (TESSALON PERLES) 100 mg capsule, Take one capsule by mouth every 8 hours as needed for Cough., Disp: , Rfl:  CALCIUM CARBONATE (CALCIUM 500 PO), Take 1 Cap by mouth Daily., Disp: , Rfl:     Cholecalciferol (VITAMIN D3) 125 mcg (5,000 unit) capsule, Take two capsules by mouth daily., Disp: , Rfl:     cyclobenzaprine (FLEXERIL) 5 mg tablet, Take one tablet by mouth three times daily as needed for Muscle Cramps. Indications: muscle spasm, Disp: 90 tablet, Rfl: 1    duloxetine DR (CYMBALTA) 60 mg capsule, Take one capsule by mouth daily. Indications: neuropathic pain, Disp: 90 capsule, Rfl: 3    flecainide (TAMBOCOR) 50 mg tablet, Take one tablet by mouth twice daily., Disp: 180 tablet, Rfl: 3    hydroxychloroquine (PLAQUENIL) 200 mg tablet, Take 1.5 tablets by mouth daily. take with food, Disp: 135 tablet, Rfl: 2    leflunomide (ARAVA) 10 mg tablet, Take one tablet by mouth daily., Disp: 90 tablet, Rfl: 0    metoprolol succinate XL (TOPROL XL) 50 mg extended release tablet, Take one tablet by mouth daily., Disp: 90 tablet, Rfl: 3    pregabalin (LYRICA) 50 mg capsule, Take one capsule by mouth three times daily., Disp: 90 capsule, Rfl: 1    traMADoL (ULTRAM) 50 mg tablet, Take one tablet by mouth every 4 hours as needed for Pain. Indications: neuropathic pain, pain, Disp: 30 tablet, Rfl: 1    VITAMIN K2 PO, Take 100 mg by mouth daily., Disp: , Rfl:     Allergies:   Allergies   Allergen Reactions    Nickel RASH    Sulfa (Sulfonamide Antibiotics) RASH and NAUSEA AND VOMITING       Objective:  BP 120/73 (BP Source: Arm, Right Upper, Patient Position: Sitting)  - Pulse 65  - Ht 158.8 cm (5' 2.5)  - Wt 67.8 kg (149 lb 6.4 oz)  - LMP  (LMP Unknown)  - SpO2 97%  - BMI 26.89 kg/m?   BP Readings from Last 3 Encounters:   09/27/22 120/73   09/22/22 129/80   09/14/22 (!) 149/106      Pulse Readings from Last 3 Encounters:   09/27/22 65   09/22/22 62   08/25/22 86     Wt Readings from Last 8 Encounters:   09/27/22 67.8 kg (149 lb 6.4 oz)   09/22/22 68 kg (150 lb)   09/14/22 68 kg (150 lb)   08/25/22 68 kg (150 lb)   07/28/22 70.1 kg (154 lb 9.6 oz)   07/18/22 67.1 kg (148 lb)   07/14/22 68 kg (150 lb)   06/23/22 68 kg (150 lb)     Constitutional: She appears well-developed and well-nourished.   HENT:  Head: Normocephalic.   Mouth/Throat: Oropharynx is clear and moist.   Eyes: Conjunctivae are normal.   Neck: Normal range of motion. No JVD present.  Cardiovascular: Normal rate, regular rhythm, normal heart sounds and intact distal pulses.  No murmur heard.  Pulmonary/Chest: Effort normal and breath sounds normal. No respiratory distress. She has no wheezes. She has no rales. She exhibits no chest wall tenderness.   Abdominal: Soft. Bowel sounds are normal. She exhibits no distension. There is no tenderness.   Musculoskeletal: Normal range of motion and muscular tone. She exhibits no edema or tenderness.   Neurological: She is alert and oriented to person, place, and time. No focal deficits.  Skin: Skin is warm. No erythema.   Psychiatric: She has a normal mood and affect. Judgment, behavior, and thought content normal.       Labs Reviewed:  CBC w/Diff  Lab Results   Component Value Date/Time    WBC 5.3 07/18/2022 10:02 AM    RBC 5.13 (H) 07/18/2022 10:02 AM    HGB 14.9 07/18/2022 10:02 AM    HCT 46.8 (H) 07/18/2022 10:02 AM    MCV 91.3 07/18/2022 10:02 AM    MCH 29.1 07/18/2022 10:02 AM    MCHC 31.9 (L) 07/18/2022 10:02 AM    RDW 14.1 07/18/2022 10:02 AM    PLTCT 249 07/18/2022 10:02 AM    MPV 9.7 07/18/2022 10:02 AM    Lab Results   Component Value Date/Time    NEUT 54 07/18/2022 10:02 AM    ANC 2.88 07/18/2022 10:02 AM    LYMA 31 07/18/2022 10:02 AM    ALC 1.63 07/18/2022 10:02 AM    MONA 12 07/18/2022 10:02 AM    AMC 0.61 07/18/2022 10:02 AM    EOSA 2 07/18/2022 10:02 AM    AEC 0.13 07/18/2022 10:02 AM    BASA 1 07/18/2022 10:02 AM    ABC 0.04 07/18/2022 10:02 AM        Comprehensive Metabolic Profile    Lab Results   Component Value Date/Time    NA 147 07/18/2022 10:02 AM    K 4.1 07/18/2022 10:02 AM    K 3.8 05/02/2022 01:52 PM    K 4.3 03/28/2022 12:36 PM    K 4.6 03/08/2022 12:00 AM    CL 101 07/18/2022 10:02 AM    CO2 31 (H) 07/18/2022 10:02 AM    GAP 15 (H) 07/18/2022 10:02 AM    BUN 25 07/18/2022 10:02 AM    CR 1.01 (H) 07/18/2022 10:02 AM    CR 0.80 05/02/2022 01:52 PM    CR 0.85 03/28/2022 12:36 PM    CR 0.79 03/08/2022 12:00 AM    GLU 108 (H) 07/18/2022 10:02 AM    GLU 98 09/23/2005 10:13 AM    Lab Results   Component Value Date/Time    CA 10.1 07/18/2022 10:02 AM    ALBUMIN 4.4 07/18/2022 10:02 AM    TOTPROT 6.9 07/18/2022 10:02 AM    ALKPHOS 77 07/18/2022 10:02 AM    AST 23 07/18/2022 10:02 AM    ALT 23 07/18/2022 10:02 AM    TOTBILI 0.6 07/18/2022 10:02 AM    GFR 76.5 03/08/2022 12:00 AM    GFRAA >60 02/21/2019 10:55 AM        Cardiac Enzymes Lipids   Lab Results   Component Value Date/Time    BNP 180 (H) 04/28/2020 12:00 AM    Lab Results   Component Value Date    CHOL 188 03/08/2022    TRIG 124 03/08/2022    HDL 49 03/08/2022    LDL 115 (H) 03/08/2022    VLDL 25 03/08/2022    CHOLHDLC 4 03/08/2022         Coagulation Endocrine   Lab Results   Component Value Date/Time    INR 1.1 01/23/2006 12:40 PM    INR 1.0 07/26/2005 07:55 AM    PT 12.2 01/23/2006 12:40 PM    Lab Results   Component Value Date/Time    TSH 0.81 03/08/2022 12:00 AM             Imaging and procedures reviewed:  Echo:   09/14/22:  The left ventricle is mildly dilated.  Normal LV wall thickness.   Normal left ventricular systolic function with visually estimated ejection fraction of 50-55%.  Abnormal septal motion consistent with left bundle branch block.  Normal diastolic function with normal estimated left atrial pressure.  Normal sized right ventricle. Normal right ventricular systolic function.  Normal sized left atrium. Normal sized right atrium..   The mitral valve has mild to moderate regurgitation.  The tricuspid valve has mild- moderate tricuspid regurgitation.  The estimated peak systolic PA pressure is 30 mmHg.  Normal CVP (0 to 5 mmHg).  No pericardial effusion.     Please see full report for additional details.    Prior study for comparison: 05/11/2020.  On the prior study, the mitral regurgitation was mild. On the prior study, the the left ventricular systolic function was mildly reduced with visually estimated ejection fraction 45%.  However, this may have been underestimated due to technical challenges related to LBBB. Therefore, this was further evaluated with an RVG study on 07/24/2020, with left ventricular ejection fraction 53%. Overall, comparing the current study to his prior echocardiogram, there is likely no significant change in the left ventricular systolic function    MPI stress test: 09/14/22:  SUMMARY/OPINION:  This study is abnormal.  There is demonstration of a reversible small sized moderate intensity perfusion abnormality involving the  distal anterior anteroseptal territory..  The computer estimated left ventricular ejection fraction is estimated at 46%.  In the presence of chronic bundle branch block there is possible risk of underestimation of EF.  Visually does appear to be borderline however in the 50% range.  There are no high risk prognostic indicators present.  The pharmacologic ECG portion of the study is negative for ischemia.     Compared to a 2021 study.  Prior study deemed to be normal from a perfusion standpoint originally the summed stress score of 11 and sum rest score of 9 were seen but thought to be artifactual given demonstration of myocardial thickening and brightening throughout these territories.     The ejection fraction on that study was estimated 49%.  A MUGA scan was performed revealing for an EF in the 50 to 55% range.     In contrast, the current study there does appear to be a reversible perfusion abnormality in the distal anterior/anteroseptal territory.  The patient is known to have a type I LAD.    Left heart cath in 2007:  IMPRESSIONS:    1.  Mild nonobstructive coronary atherosclerosis involving LAD and the left circumflex.    2.  Normal left ventricular systolic function.        Immunization History   Administered Date(s) Administered    COVID-19 (MODERNA), mRNA vacc, 100 mcg/0.5 mL (PF) 04/06/2019, 05/04/2019, 12/09/2019    Covid-19 Bivalent (77yr+)(MODERNA), mRNA Vacc, 50 mcg/0.5 mL (PF) 12/11/2020    Covid-19 Bivalent (MODERNA), mRNA Vacc (Historical) 12/11/2020    Covid-19 mRNA Vaccine >=12yo (Moderna)(2023-24 formula)(Spikevax) 12/23/2021    FLU VACCINE >3YO (Preservative Free) 01/19/2005, 12/11/2006, 11/13/2008, 12/04/2009    Flu Vaccine =>65 YO High-Dose (PF) 10/17/2019    Flu Vaccine =>65 YO High-Dose Quadrivalent (PF) 10/31/2017, 11/01/2018, 12/11/2020, 11/18/2021    Flu Vaccine Quadrivalent =>3 Yo (Preservative Free) 11/30/2015    HEP A/HEP B Combined Vaccine 05/25/2021, 11/18/2021    Pneumococcal Vaccine (20-Val) 12/11/2020    Pneumococcal Vaccine(13-Val Peds/immunocompromised adult) 10/17/2019    RSV recombinant vaccine adjuvanted (AREXVY) IM- Historical 11/18/2021    Td Vaccine 05/21/2003, 12/20/2019    Tdap Vaccine 05/25/2021    Zoster Vaccine Recombinant, Adjuvanted (shingles) IM (vial 2 of 2)(SHINGRIX) 03/29/2021, 05/25/2021         Assessment/Plan:  Vena Austria is a 72  y.o. female with nonobstructive CAD, left under branch block,paroxysmal supraventricular tachycardia, GERD, esophageal dysphagia, chest pain, lower back pain due to lumbar degenerative disc disease, left breast cancer status postlumpectomy in 2007 and after that received chemo plus radiation therapy.  She present today to the clinic for the chest pain management.    #Chest pain concerning for stable angina  #History of nonobstructive CAD  #Left bundle branch block  -ECG in the clinic shows sinus rhythm with left bundle branch block unchanged from prior EKG.  -She had abnormal stress test on 09/14/22 reversible small sized moderate intensity perfusion abnormality involving the  distal anterior anteroseptal territory.  -Due to technical challenges related to LBBB we did a RVG that came back with EF 50-55%.   -Details of echo and prior left heart catheter mentioned above.  Plan  > Scheduled to undergo left heart cath plus coronary angiogram on 10/07/22.  > We will add aspirin 81 mg daily.  Continue Lipitor 20 mg daily.  We will also provide her with sublingual nitroglycerin as needed if she continues to have chest pain before the cath procedure.  Advised patient to go to the ER if she has unbearable chest pain.  > We will obtain CBC, BMP and hemoglobin A1c    #Paroxysmal supraventricular tachycardia  Her SVT seems to have responded to flecainide 50 mg BID and metoprolol XL 50 mg/day. Last cardiac monitor was in September, 2020. That was before we started the flecainide.     #Lower back pain  #Rheumatoid arthritis  #Sjogren's disease  #Osteoporosis  -She follows with a pain clinic and also with rheumatologist, Dr. Cecelia Byars at [facility]  > Management per them.  Noted that patient is currently on hydroxychloroquine since 2020 and also on leflunomide 10 mg daily    #History of left breast cancer status postlumpectomy, chemoradiation therapy in 2007  -Per patient currently on remission.    Continue follow-up with Dr. Barry Dienes as before.    Thank you for allowing me to participate in the care of this patient.  Please do not hesitate to contact me should you have any questions or concerns.         Total time spent on today's office visit was 42 minutes.  This includes face-to-face in person visit with patient as well as nonface-to-face time including review of the EMR, outside records, labs, radiologic studies, echocardiogram & other cardiovascular studies, formulation of treatment plan, after visit summary, future disposition,  and lastly on documentation.    Lyndel Safe, MD  Advanced Heart Failure and Heart Transplant Cardiologist  [facility]  [facility]  Pager: [phone]

## 2022-09-28 ENCOUNTER — Encounter: Admit: 2022-09-28 | Discharge: 2022-09-28 | Payer: MEDICARE | Primary: General Practice

## 2022-09-28 DIAGNOSIS — K219 Gastro-esophageal reflux disease without esophagitis: Secondary | ICD-10-CM

## 2022-09-28 DIAGNOSIS — R739 Hyperglycemia, unspecified: Secondary | ICD-10-CM

## 2022-09-28 DIAGNOSIS — Z136 Encounter for screening for cardiovascular disorders: Secondary | ICD-10-CM

## 2022-09-28 DIAGNOSIS — R600 Localized edema: Secondary | ICD-10-CM

## 2022-09-28 DIAGNOSIS — E162 Hypoglycemia, unspecified: Secondary | ICD-10-CM

## 2022-09-29 ENCOUNTER — Encounter: Admit: 2022-09-29 | Discharge: 2022-09-29 | Payer: MEDICARE | Primary: General Practice

## 2022-10-03 ENCOUNTER — Encounter: Admit: 2022-10-03 | Discharge: 2022-10-03 | Payer: MEDICARE | Primary: General Practice

## 2022-10-05 ENCOUNTER — Encounter: Admit: 2022-10-05 | Discharge: 2022-10-05 | Payer: MEDICARE | Primary: General Practice

## 2022-10-05 MED ORDER — ZOLEDRONIC ACID-MANNITOL-WATER 5 MG/100 ML IV PGBK
5 mg | Freq: Once | INTRAVENOUS | 0 refills
Start: 2022-10-05 — End: ?

## 2022-10-07 ENCOUNTER — Encounter: Admit: 2022-10-07 | Discharge: 2022-10-07 | Payer: MEDICARE | Primary: General Practice

## 2022-10-07 MED ADMIN — SODIUM CHLORIDE 0.9 % IV SOLP [27838]: 500 mL | INTRAVENOUS | @ 15:00:00 | Stop: 2022-10-07 | NDC 00338004904

## 2022-10-07 MED ADMIN — SODIUM CHLORIDE 0.9 % IV SOLP [27838]: 250 mL | INTRAVENOUS | @ 15:00:00 | Stop: 2022-10-07 | NDC 00338004904

## 2022-10-11 ENCOUNTER — Encounter: Admit: 2022-10-11 | Discharge: 2022-10-11 | Payer: MEDICARE | Primary: General Practice

## 2022-10-11 MED ORDER — PREGABALIN 50 MG PO CAP
50 mg | ORAL_CAPSULE | Freq: Three times a day (TID) | ORAL | 1 refills
Start: 2022-10-11 — End: ?

## 2022-10-11 NOTE — Telephone Encounter
Patient requesting refill of lyrica  Last Office Visit: 08/25/22  Next Office Visit: 01/23/23  Previous script: 09/13/22    Request refill

## 2022-10-19 ENCOUNTER — Encounter: Admit: 2022-10-19 | Discharge: 2022-10-19 | Payer: MEDICARE | Primary: General Practice

## 2022-10-26 ENCOUNTER — Encounter: Admit: 2022-10-26 | Discharge: 2022-10-26 | Payer: MEDICARE | Primary: General Practice

## 2022-10-26 MED ORDER — LEFLUNOMIDE 10 MG PO TAB
10 mg | ORAL_TABLET | Freq: Every day | ORAL | 0 refills | 90.00000 days | Status: AC
Start: 2022-10-26 — End: ?

## 2022-10-26 NOTE — Telephone Encounter
Refill of leflunomide requested. Patient last seen 07/18/22 and scheduled for follow-up 12/20/22.    Per LOV notes, PLAN:   Labs today  X-rays to be completed at walk-in location for further assessment of ongoing joint pain  Hold off on taking the Medrol Dosepak and to see her cardiologist on Wednesday and asked them about if they are okay with you taking it given recent symptoms  Avoid use of NSAIDs such as Aleve and ibuprofen given risk factors associate with age as well as recent cardiac events.  Okay to use Tylenol if needed up to 3000 mg daily  Discussed other options with her such as physical therapy, paraffin wax baths, copper gloves, and even possibly steroid injections in the future for osteoarthritic pain  We will plan to start leflunomide if patient agreeable once lab work comes back.  I did discuss risks and benefits and need for continued lab monitoring while this medication.  I also discussed with her that she would need to find a dermatologist to perform yearly skin examinations as there is also an increased risk of not only other malignancies but also dermatological malignancies while on this medication.  Continue physical therapy  Follow-up with Dr. Clearence Cheek as soon as possible in regards to neck surgery site and possible recurrence of enlarging area as you have had in the past.     Follow-up with Dr. Cecelia Byars a in November 2024 and a nurse practitioner in 3 months.     Kristen Dolores DNP, FNP-BC.    Last labs 08/31/22    Refilled per rheumatology refill protocol.

## 2022-11-03 ENCOUNTER — Encounter: Admit: 2022-11-03 | Discharge: 2022-11-03 | Payer: MEDICARE | Primary: General Practice

## 2022-11-03 DIAGNOSIS — Z9221 Personal history of antineoplastic chemotherapy: Secondary | ICD-10-CM

## 2022-11-07 ENCOUNTER — Ambulatory Visit: Admit: 2022-11-07 | Discharge: 2022-11-08 | Payer: MEDICARE | Primary: General Practice

## 2022-11-07 ENCOUNTER — Encounter: Admit: 2022-11-07 | Discharge: 2022-11-07 | Payer: MEDICARE | Primary: General Practice

## 2022-11-07 MED ORDER — ZOLEDRONIC ACID-MANNITOL-WATER 5 MG/100 ML IV PGBK
5 mg | Freq: Once | INTRAVENOUS | 0 refills | Status: CN
Start: 2022-11-07 — End: ?

## 2022-11-07 MED ORDER — ZOLEDRONIC ACID-MANNITOL-WATER 5 MG/100 ML IV PGBK
5 mg | Freq: Once | INTRAVENOUS | 0 refills | Status: CP
Start: 2022-11-07 — End: ?
  Administered 2022-11-07: 16:00:00 5 mg via INTRAVENOUS

## 2022-11-07 NOTE — Progress Notes
Patient tolerated infusion well. No s/s of reaction. Ambulatory out of facility.

## 2022-11-08 DIAGNOSIS — M818 Other osteoporosis without current pathological fracture: Secondary | ICD-10-CM

## 2022-11-08 DIAGNOSIS — M810 Age-related osteoporosis without current pathological fracture: Secondary | ICD-10-CM

## 2022-11-29 ENCOUNTER — Encounter: Admit: 2022-11-29 | Discharge: 2022-11-29 | Payer: MEDICARE | Primary: General Practice

## 2022-11-29 MED ORDER — LEFLUNOMIDE 10 MG PO TAB
10 mg | ORAL_TABLET | Freq: Every day | ORAL | 0 refills
Start: 2022-11-29 — End: ?

## 2022-11-29 NOTE — Telephone Encounter
Refill of leflunomide requested. Patient last seen 07/18/22 and scheduled for follow-up 12/20/22.    Per LOV notes, PLAN:   Labs today  X-rays to be completed at walk-in location for further assessment of ongoing joint pain  Hold off on taking the Medrol Dosepak and to see her cardiologist on Wednesday and asked them about if they are okay with you taking it given recent symptoms  Avoid use of NSAIDs such as Aleve and ibuprofen given risk factors associate with age as well as recent cardiac events.  Okay to use Tylenol if needed up to 3000 mg daily  Discussed other options with her such as physical therapy, paraffin wax baths, copper gloves, and even possibly steroid injections in the future for osteoarthritic pain  We will plan to start leflunomide if patient agreeable once lab work comes back.  I did discuss risks and benefits and need for continued lab monitoring while this medication.  I also discussed with her that she would need to find a dermatologist to perform yearly skin examinations as there is also an increased risk of not only other malignancies but also dermatological malignancies while on this medication.  Continue physical therapy  Follow-up with Dr. Clearence Cheek as soon as possible in regards to neck surgery site and possible recurrence of enlarging area as you have had in the past.     Follow-up with Dr. Cecelia Byars a in November 2024 and a nurse practitioner in 3 months.     Bonne Dolores DNP, FNP-BC.    Last labs 11/01/22 however only CMP noted. Called [facility] to follow up on CBC w Diff, they did state one was completed and they are to fax it over.

## 2022-11-30 ENCOUNTER — Encounter: Admit: 2022-11-30 | Discharge: 2022-11-30 | Payer: MEDICARE | Primary: General Practice

## 2022-11-30 DIAGNOSIS — M3500 Sicca syndrome, unspecified: Secondary | ICD-10-CM

## 2022-11-30 DIAGNOSIS — Z79899 Other long term (current) drug therapy: Secondary | ICD-10-CM

## 2022-12-02 ENCOUNTER — Encounter: Admit: 2022-12-02 | Discharge: 2022-12-02 | Payer: MEDICARE | Primary: General Practice

## 2022-12-05 ENCOUNTER — Encounter: Admit: 2022-12-05 | Discharge: 2022-12-05 | Payer: MEDICARE | Primary: General Practice

## 2022-12-15 ENCOUNTER — Encounter: Admit: 2022-12-15 | Discharge: 2022-12-15 | Payer: MEDICARE | Primary: General Practice

## 2022-12-15 MED ORDER — HYDROXYCHLOROQUINE 200 MG PO TAB
300 mg | ORAL_TABLET | Freq: Every day | ORAL | 0 refills | 90.00000 days | Status: AC
Start: 2022-12-15 — End: ?

## 2022-12-15 NOTE — Telephone Encounter
Refill of hcq requested. Patient last seen 07/18/22 and scheduled for follow-up 12/20/22.    Per LOV notes,   She will remain on hydroxychloroquine without any interruption     2. Osteoporosis: she was on prolia but could not afford it after 2 doses, Fosamax was started in January 2021 but stopped due to epigastric pain  Started Reclast Sep 2022   Last dose in September 2023, follow up DEXA showed mild imprlvment      3. Generalized osteoarthritis.  Discussed with her use of copper gloves and paraffin wax baths for hands.  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  I did also send her message and briefly spoke with her about possible steroid injections to bilateral knees if she would find this helpful.     4. Long term Hydroxychloroquine use since mid 2020, last eye exam November 2023, she follows with ophthalmology every 6 months.  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 History of Breast cancer: Diagnosed in 2007, in remission since      PLAN:   Labs today  X-rays to be completed at walk-in location for further assessment of ongoing joint pain  Hold off on taking the Medrol Dosepak and to see her cardiologist on Wednesday and asked them about if they are okay with you taking it given recent symptoms  Avoid use of NSAIDs such as Aleve and ibuprofen given risk factors associate with age as well as recent cardiac events.  Okay to use Tylenol if needed up to 3000 mg daily  Discussed other options with her such as physical therapy, paraffin wax baths, copper gloves, and even possibly steroid injections in the future for osteoarthritic pain  We will plan to start leflunomide if patient agreeable once lab work comes back.  I did discuss risks and benefits and need for continued lab monitoring while this medication.  I also discussed with her that she would need to find a dermatologist to perform yearly skin examinations as there is also an increased risk of not only other malignancies but also dermatological malignancies while on this medication.  Continue physical therapy  Follow-up with Dr. Clearence Cheek as soon as possible in regards to neck surgery site and possible recurrence of enlarging area as you have had in the past.     Follow-up with Dr. Cecelia Byars a in November 2024 and a nurse practitioner in 3 months.     Bonne Dolores DNP, FNP-BC       Last eye exam 12/13/21    Refilled per rheumatology refill protocol.

## 2022-12-22 ENCOUNTER — Encounter: Admit: 2022-12-22 | Discharge: 2022-12-22 | Payer: MEDICARE | Primary: General Practice

## 2022-12-22 MED ORDER — DULOXETINE 60 MG PO CPDR
60 mg | ORAL_CAPSULE | Freq: Every day | ORAL | 3 refills
Start: 2022-12-22 — End: ?

## 2022-12-26 ENCOUNTER — Encounter: Admit: 2022-12-26 | Discharge: 2022-12-26 | Payer: MEDICARE | Primary: General Practice

## 2022-12-26 NOTE — Telephone Encounter
Accident last week she is in a lot of pain.  Her bp is up.  She reports that she fell while mending fence.  She fell ripping her muscle and tendons in her shoulder and injuring her tailbone.  While dealing with these issues she tripped over her grandsons shoes hitting a radiator and fracturing her knee.  She reports ongoing pain and worry.  She reports bp as high as 171/100, 140/80, 138/80.  Discussed that her bp will be elevated with pain and anxiety.  Patient was instructed if blood pressure is consistently elevated when her pain is controlled and she is relaxing to call us with readings

## 2023-01-12 ENCOUNTER — Encounter: Admit: 2023-01-12 | Discharge: 2023-01-12 | Payer: MEDICARE | Primary: General Practice

## 2023-01-12 ENCOUNTER — Ambulatory Visit: Admit: 2023-01-12 | Discharge: 2023-01-13 | Payer: MEDICARE | Primary: General Practice

## 2023-01-12 NOTE — Progress Notes
Telehealth:    Kristen Norris  is a 72 y.o. 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 and other areas and preferred to use a short burst of prednisone  During her follow-up visit leflunomide was discussed which was subsequent started in June    Her most recent toxicity monitor labs performed in September were unremarkable except creatinine 1.33 , Baseline around 0.8-1    She now reports that she has done very well since the start leflunomide resolution of her hand pain she is pleased with that, early in November she broke her kneecap while working on a fence, she missed her foot step and landed on her knees, she had osteoporosis and received 3 doses of Reclast most recently in September 2024, and prior DEXA scan 2023 showed some interval improvement since  We discussed that we will repeat the test in September of next year and reevaluate    She denies any history of renal dysfunction but recalls that she was told by another provider her renal function has been abnormal    She denies recurrent inflammatory joint pain, rashes swollen glands or any other complaints      Schirmer's test  OS 3 mm  OD 4 mm     Unstimulated salivary flow rate= 0.16 ml/min                                   Assessment and plan:    1.  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, myalgia and arthralgia, objective evidence of dry eyes and dry mouth, positive ANA and rheumatoid factor and a positive lip biopsy with focus score of 2    She started hydroxychloroquine in August 2020, and has done reasonably well on therapy     With recent recurrence of her inflammatory arthropathy early 2024 we recommended leflunomide and she has done extremely well on therapy improving her hand and ankles pain, no side effects reported, recent labs revealed increased creatinine which I do not believe is related to her Leflunomide, no history of diarrhea    She is overdue for toxicity monitor labs which she will repeat in the next couple of days and will follow-up by calling the office, we plan to reassess her back in about 6 months in person    2. Osteoporosis: she was on prolia but could not afford it after 2 doses, Fosamax was started in January 2021 but stopped due to epigastric pain  Started Reclast Sep 2022   Last dose in September 2024, follow up DEXA scan following last years dose revealed mild improvement    At this point in time, she seems to have sustained a fragility fracture when she fractured her patella when she fell while working on her house fence in November, she received the third dose of Reclast in September and therefore we will stop therapy and repeat DEXA scan end of summer 2025, she may need referral to the osteoporosis clinic      3. Generalized osteoarthritis     4. 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 History of Breast cancer: Diagnosed in 2007, in remission since       Plan:  Stop reclast   CBC Cmp every 3 months starting as soon as possible  Continue leflunomide 10 mg daily  Continue hydroxychloroquine 300 mg daily     Will repeat DEXA scan around September 2025 and to assess her response to 3 doses of Reclast, if inadequate response we may consider referring her to the osteoporosis clinic, particular given the recent osteoporotic fracture she sustained    I will see her back in about 6 months    Leverne Humbles, MD    Because this dictation was prepared with voice recognition software, there remains a potential for typographical errors or incorrect word choices by the system. We apologize for any inadvertent inconvenience from such an error.

## 2023-01-12 NOTE — Telephone Encounter
Lab orders sent to [facility] [phone]

## 2023-01-12 NOTE — Telephone Encounter
-----   Message from Tana Felts, MD sent at 01/12/2023  1:32 PM CST -----  Can you please send the new lab orders to to be done as soon as possible at [facility]?

## 2023-01-12 NOTE — Patient Instructions
Glad that you are doing well on leflunomide and we plan to keep you on this for another 12 to 18 months before reconsidering reducing the dose or stopping therapy  Continue hydroxychloroquine with annual eye exam for toxicity monitoring  The nurse will be sending you new labs to monitor your liver enzymes kidney function and bone marrow while on leflunomide, please continue to do this every 3 months next lab draw to be done as soon as you can    Since you have completed 3 doses of Reclast we will permanently discontinue the treatment    Reach out to me in case of any questions or concerns  I will see you back in about 6 months in person

## 2023-01-13 DIAGNOSIS — Z7969 Long term (current) use of other immunomodulators and immunosuppressants: Secondary | ICD-10-CM

## 2023-01-18 ENCOUNTER — Encounter: Admit: 2023-01-18 | Discharge: 2023-01-18 | Payer: MEDICARE | Primary: General Practice

## 2023-01-23 ENCOUNTER — Encounter: Admit: 2023-01-23 | Discharge: 2023-01-23 | Payer: MEDICARE | Primary: General Practice

## 2023-01-23 ENCOUNTER — Ambulatory Visit: Admit: 2023-01-23 | Discharge: 2023-01-24 | Payer: MEDICARE | Primary: General Practice

## 2023-01-25 ENCOUNTER — Encounter: Admit: 2023-01-25 | Discharge: 2023-01-25 | Payer: MEDICARE | Primary: General Practice

## 2023-01-26 ENCOUNTER — Encounter: Admit: 2023-01-26 | Discharge: 2023-01-26 | Payer: MEDICARE | Primary: General Practice

## 2023-02-02 ENCOUNTER — Encounter: Admit: 2023-02-02 | Discharge: 2023-02-02 | Payer: MEDICARE | Primary: General Practice

## 2023-02-02 ENCOUNTER — Ambulatory Visit: Admit: 2023-02-02 | Discharge: 2023-02-03 | Payer: MEDICARE | Primary: General Practice

## 2023-02-02 DIAGNOSIS — M51372 Degeneration of intervertebral disc of lumbosacral region with discogenic back pain and lower extremity pain: Secondary | ICD-10-CM

## 2023-02-02 DIAGNOSIS — M5416 Radiculopathy, lumbar region: Secondary | ICD-10-CM

## 2023-02-02 MED ORDER — IOHEXOL 240 MG IODINE/ML IV SOLN
1 mL | Freq: Once | EPIDURAL | 0 refills | Status: CP
Start: 2023-02-02 — End: ?
  Administered 2023-02-02: 14:00:00 1 mL via EPIDURAL

## 2023-02-02 MED ORDER — METHYLPREDNISOLONE ACETATE 80 MG/ML IJ SUSP
80 mg | Freq: Once | INTRAMUSCULAR | 0 refills | Status: CP
Start: 2023-02-02 — End: ?
  Administered 2023-02-02: 14:00:00 80 mg via INTRAMUSCULAR

## 2023-02-02 NOTE — Patient Instructions
 Procedure Completed Today: Lumbar Transforaminal Steroid Injection    Important information following your procedure today: You may drive today    Pain relief may not be immediate. It is possible you may even experience an increase in pain during the first 24-48 hours followed by a gradual decrease of your pain.  Though the procedure is generally safe and complications are rare, we do ask that you be aware of any of the following:   Any swelling, persistent redness, new bleeding, or drainage from the site of the injection.  You should not experience a severe headache.  You should not run a fever over 101? F.  New onset of sharp, severe back & or neck pain.  New onset of upper or lower extremity numbness or weakness.  New difficulty controlling bowel or bladder function after the injection.  New shortness of breath.    If any of these occur, please call to report this occurrence to Dr. Welton Flakes at [phone]. If you are calling after 4:00 p.m., on weekends or holidays please call [phone] and ask to have the resident physician on call for the physician paged or go to your local emergency room.  You may experience soreness at the injection site. Ice can be applied at 20 minute intervals. Avoid application of direct heat, hot showers or hot tubs today.  Avoid strenuous activity today. You may resume your regular activities and exercise tomorrow.  Patients with diabetes may see an elevation in blood sugars for 7-10 days after the injection. It is important to pay close attention to your diet, check your blood sugars daily and report extreme elevations to the physician that treats your diabetes.  Patients taking a daily blood thinner can resume their regular dose this evening.  It is important that you take all medications ordered by your pain physician. Taking medication as ordered is an important part of your pain care plan. If you cannot continue the medication plan, please notify the physician.     Possible side effects to steroids that may occur:  Flushing or redness of the face  Irritability  Fluid retention  Change in women?s menses    The following medications were used: Lidocaine , Depomedrol, and Contrast Dye

## 2023-02-02 NOTE — Procedures
Attending Surgeon: Jerilynn Som, MD    Anesthesia: Local    Pre-Procedure Diagnosis:   1. Lumbar radiculopathy    2. Degeneration of intervertebral disc of lumbosacral region with discogenic back pain and lower extremity pain        Post-Procedure Diagnosis:   1. Lumbar radiculopathy    2. Degeneration of intervertebral disc of lumbosacral region with discogenic back pain and lower extremity pain        [place] [facility] TRANSFORAMINAL LUMBAR/SACRAL  Procedure: transforaminal epidural    Laterality: left   on 02/02/2023 9:00 AM  Location: lumbar - L4-5 and L5-S1      Consent:   Consent obtained: written  Consent given by: patient    Discussed with patient the purpose of the treatment/procedure, other ways of treating my condition, including no treatment/ procedure and the risks and benefits of the alternatives. Patient has decided to proceed with treatment/procedure.   Procedures Details:   Indications: pain and diagnostic evaluation   Prep: chlorhexidine  Patient position: prone  Estimated Blood Loss: minimal  Specimens: none  Number of Levels: 2  Approach: left paramedian  Guidance: fluoroscopy  Contrast: Procedure confirmed with contrast under live fluoroscopy.  Needle and Epidural Catheter: pencil-tip  Needle size: 25 G  Injection procedure: Incremental injection, Negative aspiration for blood and Introduced needle  Patient tolerance: Patient tolerated the procedure well with no immediate complications. Pressure was applied, and hemostasis was accomplished.  Outcome: Pain relieved and Pain improved  Comments: 1ml lido and 40mg  depomedrol inj at each level  This patient's clinical history, exam, AND imaging support radiculopathy AND there is a significant impact on quality of life and function AND the pain has been present for at least 4 weeks AND they have failed to improve with noninvasive conservative care.  This patient had at least 50% pain relief for at least 3 months with the last epidural injection.      Estimated blood loss: none or minimal  Specimens: none  Patient tolerated the procedure well with no immediate complications. Pressure was applied, and hemostasis was accomplished.

## 2023-02-02 NOTE — Progress Notes
Pain Procedure Plan Of Care    Risk of injury related to procedure  Patient identification, allergies verified, fall precautions implemented    Risk of injury and impaired skin integrity  Positioning devices applied as appropriate for procedure, patient transported with staff assistance    Management of Pain  Pain assessment completed on arrival, PAR scoring following procedure and at discharge, sedation administered as ordered, patient positioned for comfort    Risk of anxiety related to procedure and disease process  Patient education on procedure and expectations, provide coping support to patient, provide relaxation techniques    Outcomes:  The patient is free of injury during and following their procedure.  Skin is intact and free of bruising.  The patients pain is managed during their stay.  Alleviation of patient anxiety exhibited.

## 2023-02-02 NOTE — Progress Notes
[facility]  INTERVENTIONAL PAIN PROCEDURE HISTORY AND PHYSICAL    Chief Complaint: Pain    HISTORY OF PRESENT ILLNESS:  rec left low back and leg pain    Past Medical History:    Accidental fall    Arthritis    Atrial flutter (HCC)    Back pain    Back pain    Bladder infection, chronic    Cardiac dysrhythmia    Chest pain    COVID-19    Degenerative disc disease, cervical    Degenerative disc disease, lumbar    Depression    Dizziness    Elevated triglycerides with high cholesterol    Environmental allergies    Essential hypertension    Genetic testing of female    GERD (gastroesophageal reflux disease)    H/O Sjogren's disease (HCC)    Heart valve disease    Hx antineoplastic chemo    Hyperlipemia    Hypoglycemia    Joint pain    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)    Nerve injury    Neuropathy    Osteoporosis    Other malignant neoplasm without specification of site    Personal history of irradiation    Personal history of malignant neoplasm of breast    PN (Peripheral Neuropathy)    PONV (postoperative nausea and vomiting)    Right rib fracture    Sjogren's disease (HCC)    Spinal headache    Valvular heart disease    Varicose veins    Vision decreased    Vitamin D deficiency       Surgical History:   Procedure Laterality Date    CARDIAC CATHERIZATION  03/28/2005    LAD 20%, Circ ostial 30%, EF 65%    HX BREAST LUMPECTOMY  06/07/2005    Left with SLNBX (0/2)    BREAST BIOPSY Left 06/07/2005    Malignant    COLONOSCOPY N/A 03/01/2010    normal ([name], [facility])    HX HYSTERECTOMY  03/24/2011    [name], MD [facility]/BSO    BREAST BIOPSY Left 02/07/2014    Benign    Excision Posterior Neck mass N/A 09/04/2020    Performed by Flo Shanks [name]., MD at [facility] OR    anterior colporrhaphy (cystocele repair);  Posterior colporrhaphy (rectocele repair); N/A 11/13/2020    Performed by [name], MD at [facility] OR    CYSTOURETHROSCOPY N/A 11/13/2020 Performed by [name], MD at [facility] OR    *Painteq Sacroiliac Joint LinQ* PERCUTANEOUS/ MINIMALLY INVASIVE ARTHODESIS SACROILIAC JOINT WITH IMAGE GUIDANCE - WITH OR WITHOUT BONE GRAFT/ TRANSFIXING DEVICE  27279- [id number] Sacroiliac joint fusion via posterer approach Left 10/12/2021    Performed by Bella Kennedy, MD at [facility] OR    ANGIOGRAPHY CORONARY ARTERY WITH LEFT HEART CATHETERIZATION N/A 10/07/2022    Performed by Reola Mosher, MD at [facility] CATH LAB    PERCUTANEOUS CORONARY STENT PLACEMENT WITH ANGIOPLASTY N/A 10/07/2022    Performed by Reola Mosher, MD at [facility] CATH LAB    CHOLECYSTECTOMY  12/2022    CARDIOVASCULAR STRESS TEST  ?    HX BLADDER SUSPENSION      HX HEART CATHETERIZATION  2007    HX SALPINGO-OOPHORECTOMY      HX TONSILLECTOMY  1979    HX TUBAL LIGATION      HX WRIST FRACTURE SURGERY      KNEE SURGERY  ]999  LAMINECTOMY  01/02/23    LYMPH NODE BIOPSY      PR LAPAROSCOPY SURG RPR INITIAL INGUINAL HERNIA  1982    SURGERY  2923    Bone implanted    TUNNELED VENOUS PORT PLACEMENT      UMBILICAL ARTERIAL CATH - BEDSIDE  8/24       family history includes Arthritis in her father; Brain Tumor (age of onset: 66) in an other family member; COPD in her daughter; Cancer in her daughter; Essie Christine (age of onset: 75) in her paternal cousin; Cancer-Breast (age of onset: 20) in her paternal cousin; Cancer-Breast (age of onset: 33) in her paternal aunt; Cancer-Breast (age of onset: 43) in an other family member; Cancer-Hematologic (age of onset: 31) in her daughter; Teresa Coombs (age of onset: 18) in her paternal uncle; Cancer-Lung (age of onset: 20) in an other family member; Cancer-Ovarian (age of onset: 49) in her paternal cousin; Cancer-Ovarian (age of onset: 39) in her daughter; Complete Hysterectomy in her sister; Complete Hysterectomy (age of onset: 72) in her daughter; Coronary Artery Disease in her father; Depression (age of onset: 59) in her brother; Diabetes in her mother; Diabetes Type II in her maternal grandmother; Heart Attack (age of onset: 60) in her brother; Heart Disease in her father, mother, and sister; Heart murmur in her sister; Hypoglycemia in her mother; None Reported in her grandchild, grandchild, grandchild, grandchild, grandchild, paternal cousin, and sister; Osteopenia in her sister; Other in her grandchild; Other (age of onset: 0) in her brother; Other (age of onset: 44) in her brother; Other (age of onset: 41) in her sister; Tobacco disorder in her daughter.    Social History     Socioeconomic History    Marital status: Divorced    Number of children: 2    Highest education level: GED or equivalent   Occupational History    Occupation: grounds Human resources officer: [name]   Tobacco Use    Smoking status: Former     Current packs/day: 0.00     Average packs/day: 1 pack/day for 14.0 years (14.0 ttl pk-yrs)     Types: Cigarettes     Start date: 04/08/1986     Quit date: 04/07/2000     Years since quitting: 22.8    Smokeless tobacco: Never   Vaping Use    Vaping status: Never Used   Substance and Sexual Activity    Alcohol use: Not Currently    Drug use: Never    Sexual activity: Not Currently     Partners: Male     Birth control/protection: Pill       Allergies   Allergen Reactions    Nickel RASH    Sulfa (Sulfonamide Antibiotics) RASH and NAUSEA AND VOMITING       Vitals:    02/02/23 0758   BP: (!) 142/80   BP Source: Arm, Right Upper   Pulse: 60   Temp: 36.6 ?C (97.9 ?F)   Resp: 14   SpO2: 98%   TempSrc: Oral   PainSc: Seven   Weight: 70.3 kg (155 lb)   Height: 157.5 cm (5' 2)     Pain Score: Seven  Oswestry Total Score:: (Patient-Rptd) 56    REVIEW OF SYSTEMS: 10 point ROS obtained and negative except pain      PHYSICAL EXAM:unchnaged        IMPRESSION:    1. Lumbar radiculopathy    2. Degeneration of intervertebral disc of lumbosacral region with discogenic  back pain and lower extremity pain         PLAN: Lumbar Transforaminal Steroid Injection

## 2023-02-03 ENCOUNTER — Encounter: Admit: 2023-02-03 | Discharge: 2023-02-03 | Payer: MEDICARE | Primary: General Practice

## 2023-02-10 ENCOUNTER — Encounter: Admit: 2023-02-10 | Discharge: 2023-02-10 | Payer: MEDICARE | Primary: General Practice

## 2023-02-14 ENCOUNTER — Encounter: Admit: 2023-02-14 | Discharge: 2023-02-14 | Payer: MEDICARE | Primary: General Practice

## 2023-02-23 ENCOUNTER — Encounter: Admit: 2023-02-23 | Discharge: 2023-02-23 | Payer: MEDICARE | Primary: General Practice

## 2023-02-23 MED ORDER — PREGABALIN 50 MG PO CAP
50 mg | ORAL_CAPSULE | Freq: Three times a day (TID) | ORAL | 1 refills | Status: AC
Start: 2023-02-23 — End: ?

## 2023-02-23 NOTE — Telephone Encounter
Patient requesting refill of lyrica  Last Office Visit 01/23/23  Next Office Visit 04/05/23

## 2023-03-08 ENCOUNTER — Encounter: Admit: 2023-03-08 | Discharge: 2023-03-08 | Payer: MEDICARE | Primary: General Practice

## 2023-03-08 MED ORDER — LEFLUNOMIDE 10 MG PO TAB
10 mg | ORAL_TABLET | Freq: Every day | ORAL | 0 refills | 90.00000 days | Status: AC
Start: 2023-03-08 — End: ?

## 2023-03-08 NOTE — Telephone Encounter
Refill of leflunomide requested. Patient last seen 01/12/23 and scheduled for follow-up 08/14/23.    Per LOV notes, Plan:  Stop reclast   CBC Cmp every 3 months starting as soon as possible  Continue leflunomide 10 mg daily  Continue hydroxychloroquine 300 mg daily      Will repeat DEXA scan around September 2025 and to assess her response to 3 doses of Reclast, if inadequate response we may consider referring her to the osteoporosis clinic, particular given the recent osteoporotic fracture she sustained     I will see her back in about 6 months     Leverne Humbles, MD    Last labs 01/13/23    Refilled per rheumatology refill protocol.

## 2023-03-16 ENCOUNTER — Encounter: Admit: 2023-03-16 | Discharge: 2023-03-16 | Payer: MEDICARE | Primary: General Practice

## 2023-03-18 ENCOUNTER — Encounter: Admit: 2023-03-18 | Discharge: 2023-03-18 | Payer: MEDICARE | Primary: General Practice

## 2023-03-22 NOTE — Progress Notes
 Kristen Norris is a 73 y.o. female.    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     TREATMENT SUMMARY:   This 73 year-old postmenopausal female developed pain in the medial side of the left chest wall in 2/07. 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 She underwent left lumpectomy and sentinel lymph node sampling on 06/07/05. Lumpectomy revealed a 2.1 x 2-cm invasive ductal carcinoma, grade II. No angiolymphatic invasion. There was perineural invasion. 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 Margins were clear. 0/2 sentinel lymph nodes were involved. Oncotype DX was sent and came back with a high recurrence score of 33, which corresponds to a 10-year rate of distant recurrence of 23% with adjuvant tamoxifen alone. For adjuvant chemotherapy, she received four cycles of dose-dense AC and 10 weeks of taxol. Taxol had to be stopped at 10 weeks due to grade 3 peripheral neuropathy. She was placed on adjuvant Arimidex in 10/07. Radiation therapy was given in [place], [place] and completed in December 2007.  Adjuvant Femara since 11/24/05 - 11/2010.        Extensive family history of breast and ovarian cancer but genetic testing @ diagnosis not performed due to financial reasons; insurance would not cover.      Family History   Problem Relation Name Age of Onset    Heart Disease Mother Kristen Norris     Hypoglycemia Mother Kristen Norris     Diabetes Mother Kristen Norris     Heart Disease Father Kristen Norris         farmer    Coronary Artery Disease Father Kristen Norris     Arthritis Father Kristen Norris     Complete Hysterectomy Sister Kristen Norris         rotator cuff surgery    Other Sister Kristen Norris 39        COVID complications    None Reported Sister Kristen Norris     Heart murmur Sister Kristen Norris     Heart Disease Sister Kristen Norris     Osteopenia Sister Kristen Norris     Heart Attack Brother Kristen Norris 45    Other Brother Kristen Norris 64        shot himself 06/30/2019    Depression Brother Kristen Norris 56        hung himself 05/2017    Other Brother  0        died in utero-umbilical cord wrapped around him    Diabetes Type II Maternal Grandmother      Cancer-Hematologic Daughter Kristen Norris 36    COPD Daughter Kristen Norris         on oxygen    Tobacco disorder Daughter Kristen Norris         on [employer] Daughter Kristen Norris 51        genetic testing negative    Cancer Daughter Kristen Norris         ovarian cancer.    Complete Hysterectomy Daughter Kristen Norris 62    Cancer-Breast Paternal Aunt Kristen Norris 55        bilateral mastectomy    Cancer-Lung Paternal Kristen Norris 45    None Reported Grandchild Kristen Norris     Other Grandchild Kristen Norris  wrestler-stomach problems    None Reported Grandchild Kristen Norris     None Reported Grandchild Kristen Norris     None Reported Grandchild Kristen Norris     None Reported Grandchild Kristen Norris     Cancer-Breast Other Kristen Norris 60        paternal 1st cousin    Cancer-Lung Other Kristen Norris 34        paternal 1st cousin    Cancer-Breast Paternal Kristen Norris 32        paternal 1st cousin    Cancer-Ovarian Paternal Kristen Norris 25    None Reported Paternal Cousin Kristen Norris     Cancer-Breast Paternal Kristen Norris 38        paternal 1st cousin    Brain Tumor Other Kristen Norris 50        nephew    Cervical Cancer Neg Hx      Cancer-Colon Neg Hx      Cancer-Uterine Neg Hx      Bleeding Disorders Neg Hx       GENETIC TESTING: None in the past  02/08/2018 [facility] myRisk Hereditary Cancer Test:  VUS APC c.1977C>A (p.Asn659Lys)   03/24/2023 Verification of status of VUS through [employer]. Remains variant of uncertain significance.      HISTORY OF PRESENT ILLNESS:   Kristen Norris to the [facility] today for continued follow-up related to her history of left chest cancer diagnosed in 2007. S/P lumpectomy/SLNBX (0/2). She received adjuvant chemotherapy AC x 4 followed by weekly Taxol x 10 of 12 planned cycles; stopped due to grade 3 peripheral neuropathy. She received WBR in [place] which completed in 01/2006. Ms. Kristen Norris has an extensive family history of breast and ovarian cancer but genetic testing not done due to financial reasons. At her visit in 01/2016 she complained of horrible bony pain and bone scan was performed 02/05/2016 which was negative for osseous metastatic disease. She also had an elevated Hgb, Hct and RBC as well as hyperkalemia. Abdominal US was performed 02/05/2016 and showed a milk calcium cyst (6mm) in the left kidney, otherwise normal.  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         Review of Systems  Weight (BMI 26.46; # 147 <- 27.72; # 154 <- 26.9; # 152 <- # 26.0; # 146 <- 27.33; # 149 <- 27.18 <- 26.46).   02/08/2018 [facility] myRisk Hereditary Cancer Test:  VUS APC c.1977C>A (p.Asn659Lys)   She is raising her great-grandson Kristen Norris); granted custody in 2023.   Her daughter Kristen Norris; Kristen Norris's grandmother) was diagnosed with Stage IV ovarian CA; Hospice (2022/04/22, she passed away June 05, 2022).   Completed femara therapy in 11/2010.    Patient reports that she is getting Prolia every 6 months (PCP). This treatment started in 03/2015.   Broke a left rib 2017/03/16  and then caught a bad cold with coughing and broke another left rib 06/2017.  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   Continues on Plaquenil and Flexeril.   Referred to (Dr. Chales Abrahams) neurologist.   Referred to (Dr. Littie Deeds) urologist (now on oxybutnin); underwent cystocele repair 11/13/2020.   Fell in 12/2017; has had several falls since. No injury per patient.   Increasing number of falls.   Kristen Norris (younger brother hung himself 06-14-17; age 74).  Kristen Norris (younger brother shot himself  06/30/2019; age 90).   Kristen Norris (older sister Kristen Norris died from COVID complications 05/01/19).   Patient reports that she developed COVID-19 in 12/28/2018.   COVID vaccination completed (04/06/2019 and 05/04/2019; Moderna).   COVID booster 12/09/2019; Moderna.  Influenza and bronchitis (01/2021).   No fever or chills.   No headache.   SOB and cough continues without changes.   No chest pain.Referred to (Dr. Barry Dienes) cardiologist (now on metopropolol and flecainide.  No diagnosis of LUE lymphedema.   Felt bilateral lumps in her breasts; bilateral diagnostic mammogram and bilateral breast ultrasound (04/01/2021).   No numbness or tingling of the fingers or toes.  No weakness.   No skin rash.   She had an excision of a mass (09/04/2020; benign pathology); Dr. Flo Shanks.  Chronic lower back pain; established with Dr. Rudell Cobb in the spine center.   Multiple falls in 05/01/22 (11/2022; left rotator cuff tear), (12/13/2022; left knee patellar fracture).   No surgery with either injury.   Cholecystectomy (01/17/2023)   EGD 02/2023; normal.   ECHO/Stress; abnormal 08/2022. Angiogram (10/07/2022; [place]).   No nausea, vomiting, diarrhea, constipation or abdominal pain.   Patient states that she had a colonoscopy ([place], [place]); 30-Apr-2016, normal per patient report.   Total laparoscopic hysterectomy bilateral salpingooophorectomy 03/2011 (pathology negative)  Family history of paternal first cousins x 3 with breast CA (50, 25, 26), 1 paternal aunt with breast CA (9) and 1 paternal cousin with ovarian CA. Patient has 2 daughters.   Pre-cancer (02/2023) forehead.  No hot flashes or vaginal dryness.   No formal exercise.  Former smoker (20 year 1ppd history); quit 30-Apr-1997.     Current Medication List:          albuterol sulfate (PROAIR HFA) 90 mcg/actuation HFA aerosol inhaler Inhale two puffs by mouth into the lungs every 4 hours as needed for Wheezing or Shortness of Breath. (Patient not taking: Reported on 03/24/2023)    aspirin (ASPIRIN CHILDRENS) 81 mg chewable tablet Chew one tablet by mouth daily.    atorvastatin (LIPITOR) 40 mg tablet Take one tablet by mouth daily. Indications: hardening of the arteries due to plaque buildup    benzonatate (TESSALON PERLES) 100 mg capsule Take one capsule by mouth every 8 hours as needed for Cough. (Patient not taking: Reported on 03/24/2023)    CALCIUM CARBONATE (CALCIUM 500 PO) Take 1 Cap by mouth Daily.    Cholecalciferol (VITAMIN D3) 125 mcg (5,000 unit) capsule Take two capsules by mouth daily.    duloxetine DR (CYMBALTA) 60 mg capsule Take one capsule by mouth daily. Indications: neuropathic pain    flecainide (TAMBOCOR) 50 mg tablet Take one tablet by mouth twice daily.    hydroxychloroquine (PLAQUENIL) 200 mg tablet Take 1.5 tablets by mouth daily. take with food    leflunomide (ARAVA) 10 mg tablet TAKE ONE TABLET BY MOUTH DAILY.    metoprolol succinate XL (TOPROL XL) 50 mg extended release tablet Take one tablet by mouth daily.    nitroglycerin (NITROSTAT) 0.4 mg tablet Place 1 tab under tongue every 5 minutes as needed for Chest Pain (Not to exceed 3 doses /15 min. If pain persists , seek medical attention    pregabalin (LYRICA) 50 mg capsule TAKE ONE CAPSULE BY MOUTH THREE TIMES DAILY.    traMADoL (ULTRAM) 50 mg tablet Take one tablet by mouth every 4 hours as needed for Pain. Indications: neuropathic pain, pain    VITAMIN K2 PO Take 100 mg  by mouth daily.          Objective   Vitals:    03/24/23 0940 03/24/23 0941   BP:  139/76   BP Source:  Arm, Right Upper   Pulse:  74   Temp:  36.7 ?C (98 ?F)   Resp:  16   SpO2:  99%   O2 Device:  None (Room air)   TempSrc:  Oral   PainSc: Eight Seven   Weight:  68.8 kg (151 lb 9.6 oz)         Body mass index is 27.73 kg/m?Marland Kitchen        Pain Addressed:   Patient under the care of ortho/neurology ; rotator and knee injuries in 2024.    Fatigue Scale:6    ECOG performance status is 0, Fully active, able to carry on all pre-disease performance without restriction.Marland Kitchen    Physical Exam  Vitals reviewed.   Chest:       Lymphadenopathy:      Cervical: No cervical adenopathy.      Upper Body:      Right upper body: No supraclavicular adenopathy.      Left upper body: No supraclavicular adenopathy.     This is a 73 year old female in no acute distress, well developed.   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   Neck: No JVD, supple.   Chest: Wheezes noted in the bilateral lower lobes (unchanged).    CV:  RRR without murmur.   Abdomen: Soft, non-distended, non-tender, positive bowel sounds, no organomegaly.   Skin: No rash.   Extremities: No clubbing, cyanosis.   No evidence of LUE lymphedema.   Neuro: CN: II-XII grossly intact; no sensory or motor abnormalities noted.     CBC w/Diff    Lab Results   Component Value Date/Time    WBC 5.30 03/24/2023 08:23 AM    RBC 4.73 03/24/2023 08:23 AM    HGB 13.9 03/24/2023 08:23 AM    HCT 41.7 03/24/2023 08:23 AM    MCV 88.3 03/24/2023 08:23 AM    MCH 29.3 03/24/2023 08:23 AM    MCHC 33.2 03/24/2023 08:23 AM    RDW 14.3 03/24/2023 08:23 AM    PLTCT 207 03/24/2023 08:23 AM    MPV 8.6 03/24/2023 08:23 AM    Lab Results   Component Value Date/Time    NEUT 65 03/24/2023 08:23 AM    ANC 3.40 03/24/2023 08:23 AM    LYMA 23 (L) 03/24/2023 08:23 AM    ALC 1.20 03/24/2023 08:23 AM    MONA 9 03/24/2023 08:23 AM    AMC 0.50 03/24/2023 08:23 AM    EOSA 3 03/24/2023 08:23 AM    AEC 0.20 03/24/2023 08:23 AM    BASA 1 03/24/2023 08:23 AM    ABC 0.00 03/24/2023 08:23 AM          Lab Results   Component Value Date    CA125 29 03/24/2023           BREAST IMAGING:   Bilateral mammogram 07/23/08: BIRAD 2-BENIGN   Left breast US 07/23/08: BIRAD 1-NEGATIVE   Bilateral mammogram 08/05/09: BIRAD 1-NEGATIVE   Bilateral mammogram 12/12011: BIRADS 1  Bilateral breast US 01/2010: Multiple real-time grayscale images were utilized to evaluate the numerous sites of palpable fullness within the right upper breast, right medial breast at the 3:00 position, left breast 12:00 position, left breast 11:00 position and the left  periareolar region superiorly. No masses are seen. There is no evidence of malignancy Impression:  ASSESSMENT: BIRAD 1-NEGATIVE.   Bilateral mammograms 02/02/2011: BIRADS 1  Bilateral DMAMM (TOMO) and Left breast ultrasound 02/07/2012:    FINDINGS:  There are scattered fibroglandular densities. 3-D (tomosynthesis) images were performed in addition to 2D ammograms. No masses, densities or calcifications to suggest malignancy. No change when compared to prior studies.    FINDINGS:  Ultrasound was performed to evaluate the area of palpable finding and pain detected by the patient at 12:00 5 cm from the nipple. At this location no abnormalities are present. Additional imaging was performed at areas of skin marking where referring provider palpated a finding. These are located at 1230 o'clock 4 cm from the nipple 11:00 6 cm from the nipple and 1130 5 cm from the nipple. At the locations no abnormalities are present. Scar from prior lumpectomy is noted at 10:00 7 cm from the nipple.  Imaging of the left axilla is unremarkable.  IMPRESSION:  ASSESSMENT: BIRAD 2-BENIGN.    Bilateral mammogram (TOMO) 02/05/2013:  ASSESSMENT: BIRAD 2-BENIGN  Bilateral mammogram (TOMO) 02/04/2014: Results pending at this dictation  ASSESSMENT: BIRAD 4-Suspicious (Overall)  DIAG MAMMO BL/T: BIRAD 4-suspicious abnormality in both breasts.  [place] TARGET BILAT: BIRAD 1-negative.  RECOMMENDATION:  Stereotactic biopsy of the left breast.    Surgical pathology:   Left MTBX 03/10/2014:   Final Diagnosis:   A. Breast, left breast 10:00 with calcs, needle core biopsies: Atrophic breast tissue with focal fibrosis and calcifications.   B. Breast, left breast 10:00 no calcs, needle core biopsies: Benign breast tissue with microcalcification within fibrotic stroma.   Bilateral screening mammogram TOMO 02/05/15: ACR BI-RADS? Assessments: BIRAD 1-Negative  02/05/2016 BILATERAL MAMMOGRAM:  ACR BI-RADS? Assessments: BIRAD 1-Negative  02/05/2016 TARGETED RIGHT BREAST ULTRASOUND: ACR BI-RADS? Assessments: BIRAD 1-Negative  02/06/2017 BILATERAL MAMMOGRAM: ACR BI-RADS? Assessments: BIRAD 2-Benign  02/08/2018 BILATERAL MAMMOGRAM (TOMO):  ASSESSMENT: BIRAD 2-Benign  02/15/2019 BILATERAL MAMMOGRAM (TOMO):  ASSESSMENT: BIRAD 2-Benign  02/17/2020 BILATERAL MAMMOGRAM (TOMO):  ASSESSMENT: BIRAD 2-Benign  04/01/2021 BILATERAL DIAGNOSTIC MAMMOGRAM (TOMO)/BILATERAL BREAST ULTRASOUND:  ASSESSMENT: BIRAD 2-Benign (Overall)   DIAG MAMMO BL/T: BIRAD 2-benign finding.   [place] TARGET BILAT: BIRAD 1-negative.   03/28/2022 BILATERAL MAMMOGRAM (TOMO):   ASSESSMENT:  Left: 1 - Negative  Right: 2 - Benign  Overall: 2 - Benign  03/24/2023 BILATERAL MAMMOGRAM (TOMO): ASSESSMENT:   Overall: 1 - Negative     Assessment and Plan:    1. Left breast cancer; T2 Grade II, ER positive, HER-2/neu negative left breast cancer, S/P adjuvant chemotherapy, dose-dense Adriamycin and Cytoxan and 10 weeks of Taxol. Currently on adjuvant Femara since 10/07 and finished 5 years 11/2010. No clinical evidence of recurrence at this time.  2. Bone health is managed by PCP.   3. Family history of breast and ovarian cancer: no genetic testing due to cost. 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 03/24/2023 Verification of status of VUS through [employer]. Remains variant of uncertain significance.  In the absence of a definitive mutation, the risk for future cancers and medical management recommendations should be based on personal and family history of cancer.  4. Reviewed signs to watch for that could indicate a local or distant recurrence. Patient will call our office with any new signs.   Local recurrence  Signs and symptoms of local recurrence within the same breast may include:   A new lump in your breast or irregular area of firmness  A new thickening in your breast area   A new pulling back of the skin or dimpling at the lumpectomy site   Skin inflammation or area of redness   Flattening or indentation of your nipple or other nipple changes  A regional breast cancer recurrence means the cancer has come back in the lymph nodes in your armpit or collarbone area. Signs and symptoms of regional recurrence may include:   A lump or swelling in the lymph nodes under your arm or in the groove above your collarbone   Swelling of your arm (this could be related to lymphedema or even a blood clot)  Persistent pain in your arm and shoulder   Increasing loss of sensation in your arm and hand  Distant (metastatic) recurrence   A distant, or metastatic, recurrence means the cancer has traveled to distant parts of the body, most commonly the bones, liver and lungs. The signs and symptoms may include:   Pain, such as chest or bone pain   Persistent, dry cough   Difficulty breathing   Loss of appetite   Persistent nausea, vomiting or weight loss   Swelling in the abdomen  Severe headaches  When to call our office   You know your body best -- what feels normal and what doesn't. It's important to be aware of the signs and symptoms of recurrent breast cancer, such as:   New and persistent pain   Changes or new lumps in your breast  Weight loss   Shortness of breath  If you experience any signs and symptoms that might suggest a recurrence, call our office. Patient verbalized understanding and phone numbers provided.    5. RTC one year with bilateral mammogram/lab    Total Time Today was 40 minutes in the following activities: Preparing to see the patient, Obtaining and/or reviewing separately obtained history, Performing a medically appropriate examination and/or evaluation, Counseling and educating the patient/family/caregiver, Ordering medications, tests, or procedures, Referring and communication with other health care professionals (when not separately reported), Documenting clinical information in the electronic or other health record, Independently interpreting results (not separately reported) and communicating results to the patient/family/caregiver, and Care coordination (not separately reported)      Ernie Hew, APRN-BC, CBCN  Nurse Practitioner  Collaborating physician: Dellia Cloud, MD  NPI # [id number]    Sharia Reeve, APRN-NP

## 2023-03-23 NOTE — Patient Instructions
 Kristen Norris,     Thank you for coming to see [place] today.   Please call our office or send a message through MyChart if you have any questions or concerns.  When calling please leave your name (including spelling), date of birth, phone number where you can be  reached and a description of why you are calling. We will return your call as soon as possible.   Sometimes the clinic can get very busy and we might not be able to get to your phone call until later in the day.    Lab/Imaging Results: Due to the CARES act, as of April 1st, 2021; results automatically release to MyChart.   Ernie Hew, APRN will continue to send you a result note on anything that she orders.   With these changes you may see your results before she does.   Critical lab results will be addressed immediately.   Please allow up to 72 hours for Ernie Hew, APRN to review and respond to your results before reaching out with any questions.                                                                                                                                                             Devoria Glassing RN, BSN, OCN, CBCN, CN-BN  Clinical Nurse Coordinator for Ernie Hew, APRN-BC        [facility]   [phone] (nurse phone)  [phone] (URGENT NEED or After-Hours Line - Answered 24/7)  [phone] (fax)      [facility]   Clinic: Monday, Thursday, Friday   Admin: Tuesday  [address]  [place], [place] [address]  Scheduling: [phone]  Fax: [phone]   [facility]: Wednesday  [address]  [place], [place] [address]  Scheduling: [phone]                         [phone]   Fax: [phone] or [phone]

## 2023-03-24 ENCOUNTER — Ambulatory Visit: Admit: 2023-03-24 | Discharge: 2023-03-25 | Payer: MEDICARE | Primary: General Practice

## 2023-03-24 ENCOUNTER — Encounter: Admit: 2023-03-24 | Discharge: 2023-03-24 | Payer: MEDICARE | Primary: General Practice

## 2023-03-24 DIAGNOSIS — Z9221 Personal history of antineoplastic chemotherapy: Secondary | ICD-10-CM

## 2023-03-24 DIAGNOSIS — Z9229 Personal history of other drug therapy: Secondary | ICD-10-CM

## 2023-03-24 DIAGNOSIS — Z8041 Family history of malignant neoplasm of ovary: Secondary | ICD-10-CM

## 2023-03-24 DIAGNOSIS — Z8781 Personal history of (healed) traumatic fracture: Secondary | ICD-10-CM

## 2023-03-24 DIAGNOSIS — Z853 Personal history of malignant neoplasm of breast: Secondary | ICD-10-CM

## 2023-03-24 DIAGNOSIS — Z9189 Other specified personal risk factors, not elsewhere classified: Secondary | ICD-10-CM

## 2023-03-24 DIAGNOSIS — Z13820 Encounter for screening for osteoporosis: Secondary | ICD-10-CM

## 2023-03-24 DIAGNOSIS — Z803 Family history of malignant neoplasm of breast: Secondary | ICD-10-CM

## 2023-03-24 DIAGNOSIS — Z1273 Encounter for screening for malignant neoplasm of ovary: Secondary | ICD-10-CM

## 2023-03-24 DIAGNOSIS — M810 Age-related osteoporosis without current pathological fracture: Secondary | ICD-10-CM

## 2023-03-24 DIAGNOSIS — Z79899 Other long term (current) drug therapy: Secondary | ICD-10-CM

## 2023-03-24 DIAGNOSIS — Z1231 Encounter for screening mammogram for malignant neoplasm of breast: Secondary | ICD-10-CM

## 2023-03-27 ENCOUNTER — Encounter: Admit: 2023-03-27 | Discharge: 2023-03-27 | Payer: MEDICARE | Primary: General Practice

## 2023-03-27 MED ORDER — DULOXETINE 60 MG PO CPDR
60 mg | ORAL_CAPSULE | Freq: Every day | ORAL | 3 refills | 60.00000 days | Status: AC
Start: 2023-03-27 — End: ?

## 2023-03-27 MED ORDER — HYDROXYCHLOROQUINE 200 MG PO TAB
ORAL_TABLET | ORAL | 0 refills | 90.00000 days | Status: AC
Start: 2023-03-27 — End: ?

## 2023-03-27 NOTE — Telephone Encounter
 Patient requesting refill of cymbalta  Last Office Visit 01/23/23  Next Office Visit 04/02/23

## 2023-03-27 NOTE — Telephone Encounter
 Last Office Visit: 01/12/2023  Next Office Visit: 08/14/2023  Last Eye Exam: 12/13/2021. Sent MyChart message asking about eye exams.   Plan:  Long term Hydroxychloroquine use since mid 2020, last eye exam November 2023, she follows with ophthalmology every 6 months     Refilled per protocol--90 day to get updated exam

## 2023-04-03 ENCOUNTER — Encounter: Admit: 2023-04-03 | Discharge: 2023-04-03 | Payer: MEDICARE | Primary: General Practice

## 2023-04-05 ENCOUNTER — Encounter: Admit: 2023-04-05 | Discharge: 2023-04-05 | Payer: MEDICARE | Primary: General Practice

## 2023-04-05 ENCOUNTER — Ambulatory Visit: Admit: 2023-04-05 | Discharge: 2023-04-05 | Payer: MEDICARE | Primary: General Practice

## 2023-04-05 NOTE — Progress Notes
 [facility] CLINIC NOTE       SUBJECTIVE:   Chronic back pain   fall in Oct   She was seen at [facility]   Reports she fell while fixing a fence   When she went into the home she fell and injured her knee - reports she had a frx   She has been braced for immobilizaion and seen by ortho  Has increased her lower back pain as a result     Left shoulder pain with this injury   Has tendon tears, partial   Had joint inj with ortho 2 mo ago with moderate relief   She is back to working but not full time     Also recent gallbladder surgery in Nov and is healing ok from this     Chronic neck pain   over the last 2 mo has been worse    TRX   Left - L4-5 and L5-S1 with 80% relief with > 50% relief for >3 mo with improvement in function and activity         Review of Systems    Current Outpatient Medications:     aspirin (ASPIRIN CHILDRENS) 81 mg chewable tablet, Chew one tablet by mouth daily., Disp: , Rfl:     atorvastatin (LIPITOR) 40 mg tablet, Take one tablet by mouth daily. Indications: hardening of the arteries due to plaque buildup, Disp: 90 tablet, Rfl: 3    CALCIUM CARBONATE (CALCIUM 500 PO), Take 1 Cap by mouth Daily., Disp: , Rfl:     Cholecalciferol (VITAMIN D3) 125 mcg (5,000 unit) capsule, Take two capsules by mouth daily., Disp: , Rfl:     duloxetine DR (CYMBALTA) 60 mg capsule, TAKE ONE CAPSULE BY MOUTH DAILY., Disp: 90 capsule, Rfl: 3    flecainide (TAMBOCOR) 50 mg tablet, Take one tablet by mouth twice daily., Disp: 180 tablet, Rfl: 3    hydroxychloroquine (PLAQUENIL) 200 mg tablet, TAKE 1 & 1/2 TABLETS BY MOUTH DAILY. TAKE WITH FOOD, Disp: 135 tablet, Rfl: 0    leflunomide (ARAVA) 10 mg tablet, TAKE ONE TABLET BY MOUTH DAILY., Disp: 90 tablet, Rfl: 0    metoprolol succinate XL (TOPROL XL) 50 mg extended release tablet, Take one tablet by mouth daily., Disp: 90 tablet, Rfl: 3    nitroglycerin (NITROSTAT) 0.4 mg tablet, Place 1 tab under tongue every 5 minutes as needed for Chest Pain (Not to exceed 3 doses /15 min. If pain persists , seek medical attention, Disp: 25 tablet, Rfl: 3    pregabalin (LYRICA) 50 mg capsule, TAKE ONE CAPSULE BY MOUTH THREE TIMES DAILY., Disp: 90 capsule, Rfl: 1    traMADoL (ULTRAM) 50 mg tablet, Take one tablet by mouth every 4 hours as needed for Pain. Indications: neuropathic pain, pain, Disp: 30 tablet, Rfl: 1    VITAMIN K2 PO, Take 100 mg by mouth daily., Disp: , Rfl:   Allergies   Allergen Reactions    Nickel RASH    Sulfa (Sulfonamide Antibiotics) RASH and NAUSEA AND VOMITING     Physical Exam  Vitals:    04/05/23 0937   BP: (!) 140/74   Pulse: 74   SpO2: 96%   PainSc: Four   Weight: 68.5 kg (151 lb)   Height: 158.8 cm (5' 2.5)     Oswestry Total Score:: (Patient-Rptd) 42  Pain Score: Four  Body mass index is 27.18 kg/m?Marland Kitchen      General: Alert, oriented, mild distress  HEENT: normocephalic  Resp: Non labored  breathing, no distress  MS: Sits during call .  Behavior: Calm, cooperative, behavior and speech normal.          IMPRESSION:  No diagnosis found.        PLAN:   Reviewed imaging   Discussed trx options   Consider MBB for RFA lumbar   Left side to start     Medial Branch Block Checklist:Moderate to severe chronic low back pain, predominately axial, that causes functional deficit measured on pain or disability scale.  Yes *[x]   No []   Pain present for minimum of 3 months with documented failure to respond to noninvasive conservative management such as PT,. Inj , meds     Patient has radiculopathy Yes; Has radiculopathy been treated Yes  Previous radiculopathy treated by ESI injection Yes *[]   No []     NA []      Patient has neurogenic claudication   Yes *[]   No [x]   Has neurogenic claudication been treated   Yes *[]   No []     NA [x]              There is no non-facet pathology per clinical assessment or radiology studies that could explain the source of the patient 's pain, including but not limited to           Fracture [x]   Tumor [x]  Infection [x]   Significant deformity [x]   Purpose of Injection:  Diagnostic [x]   Therapeutic []     Previous Medial Branch Block injection was equal to or greater than 2 weeks      NA  Has this level been previously ablated greater than 2 years Yes *[]   No []     NA [x]      This level has never had an Anterior Lumbar Interbody Fusion      Yes *[x]   No []          Oswestry:  Oswestry Total Score:: (Patient-Rptd) 42

## 2023-04-06 DIAGNOSIS — M47816 Spondylosis without myelopathy or radiculopathy, lumbar region: Secondary | ICD-10-CM

## 2023-04-13 ENCOUNTER — Encounter: Admit: 2023-04-13 | Discharge: 2023-04-13 | Payer: MEDICARE | Primary: General Practice

## 2023-04-19 ENCOUNTER — Encounter: Admit: 2023-04-19 | Discharge: 2023-04-19 | Payer: MEDICARE | Primary: General Practice

## 2023-05-11 ENCOUNTER — Encounter: Admit: 2023-05-11 | Discharge: 2023-05-11 | Primary: General Practice

## 2023-05-16 ENCOUNTER — Encounter: Admit: 2023-05-16 | Discharge: 2023-05-16 | Primary: General Practice

## 2023-05-16 ENCOUNTER — Ambulatory Visit: Admit: 2023-05-16 | Discharge: 2023-05-17 | Primary: General Practice

## 2023-05-16 DIAGNOSIS — I447 Left bundle-branch block, unspecified: Secondary | ICD-10-CM

## 2023-05-16 DIAGNOSIS — I2510 Atherosclerotic heart disease of native coronary artery without angina pectoris: Secondary | ICD-10-CM

## 2023-05-16 DIAGNOSIS — I471 PSVT (paroxysmal supraventricular tachycardia): Secondary | ICD-10-CM

## 2023-05-16 NOTE — Progress Notes
 Date of Service: 05/16/2023    Kristen Norris is a 73 y.o. female.       HPI     Kristen Norris was in the [place] clinic today for follow-up regarding left bundle branch block.,  PSVT, and episodes of chest discomfort.  When I last saw her in June, 2024, she was going through a terribly stressful time with her granddaughter dying recently of ovarian carcinoma.  The granddaughter's 35-year-old son is living with Kristen Norris and she is currently his guardian.    She'd had an odd episode of chest discomfort back in June, 2024.  A pharmacologic stress test was abnormal and she had coronary arteriography showed only mild disease.  In retrospect the chest discomfort and nausea were likely to be symptoms of her gall bladder disease.  Her gall bladder came out and she's had no more trouble.     She has not had trouble with palpitation symptoms.  She denies any syncope or near syncope and she has had no TIA or stroke symptoms.         Vitals:    05/16/23 1419   BP: 116/74   BP Source: Arm, Right Upper   Pulse: 60   SpO2: 97%   O2 Device: None (Room air)   PainSc: Four   Weight: 65 kg (143 lb 3.2 oz)   Height: 158.8 cm (5' 2.5)     Body mass index is 25.77 kg/m?[name]     Past Medical History  Patient Active Problem List    Diagnosis Date Noted    Atherosclerosis of native coronary artery of native heart without angina pectoris 02/12/2023     09/2022 - Cardiac cath showed mild CAD      Sjogren's syndrome with myopathy 02/12/2023    Esophageal dysphagia 07/28/2022    Vestibular dysfunction of both ears 03/15/2022    Chronic left-sided low back pain with left-sided sciatica 05/08/2021    Prolapse of female pelvic organs 11/13/2020    Other osteoporosis without current pathological fracture 10/19/2020    Soft tissue mass 08/18/2020    Rectocele, female 06/03/2020     06/03/20 - pt has 2-3+ rectocele, more prominent on standing with symptoms of urinary incontinence.      Frequent urinary incontinence 06/03/2020    Autonomic dysfunction 06/06/2019 Other neutropenia 02/26/2019    PSVT (paroxysmal supraventricular tachycardia) 11/01/2018     09/2018 - Intermittent near syncope.  2-week cardiac monitor shows episodes of fast SVT 3-20 seconds in duration.      Overactive bladder 10/31/2018    Xerostomia 02/23/2018    Rheumatoid factor positive 02/23/2018    Arthralgia of right temporomandibular joint 02/23/2018    S/P hysterectomy with oophorectomy 07/15/2011    Pelvic pain in female 07/15/2011    Cystocele 07/15/2011    Left bundle branch block 03/17/2011    Preoperative evaluation to rule out surgical contraindication 03/17/2011    Abnormal electro-oculogram 03/17/2011    Ovarian cyst, left 01/05/2011    Degeneration of lumbar or lumbosacral intervertebral disc 04/18/2010    Thoracic or lumbosacral neuritis or radiculitis, unspecified 04/18/2010    S/P Lumpectomy of Breast-Surgical Oncology Notes 12/17/2007     DIAGNOSIS:  Left breast Invasive Ductal Cancer diagnosed 3/07,  STAGE II-A  T2 N0 M0  Markers:  ER 100%, PR 0%, Ki67 15%, HER-2/neu negative.     PROCEDURE: Left lumpectomy with sentinel node biopsy 06/07/2005    HPI: This 73 year old postmenopausal female developed pain in the medial side of the  left chest wall in 2/07.  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 She underwent left lumpectomy and sentinel lymph node sampling on 06/07/05. Lumpectomy revealed a 2.1 x 2-cm invasive ductal carcinoma, grade II. No angiolymphatic invasion. There was perineural invasion. 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 Margins were clear. 0/2 sentinel lymph nodes were involved.   Oncotype DX was sent and came back with a high recurrence score of 33, which corresponds to a 10-year rate of distant recurrence of 23% with adjuvant tamoxifen alone. For adjuvant chemotherapy, she received four cycles of dose-dense AC and 10 weeks of taxol by Dr. Bonni Butter.  . Taxol had to be stopped at 10 weeks due to grade 3 peripheral neuropathy. She was placed on adjuvant Arimidex in 10/07.  Radiation therapy was given by Dr. Licia Reek in [place], [place] and completed in December 2007.   She was last seen in 12/2007 with c/o left axillary burning.  On exam there was a stable scar retraction at left lumpectomy site in left UIQ. No palpable mass, no nipple or areolar change. Right breast: palpable fullness with tenderness right UIQ most consistent with prominent pectoral muscle (patient works heavy labor as gardener). Left diagnostic mammogram 11/09 was class 2. Right breast with palpable tender fullness right upper breast/chest wall, benign clinically. Right breast sonogram also negative 11/09.  Follow-up exam 03/2008 assessment:  Right breast pain- negative clinical exam as well as negative Ultrasound. Findings and complaint most consistent with musculoskeletal etiology (patient works in Aeronautical engineer).  She now returns for a 3 month f/u exam to confirm this impression.           GERD (gastroesophageal reflux disease)     Depression     Environmental allergies     PN (Peripheral Neuropathy)     Hypoglycemia     Osteoporosis      BMD  T-score (-2.3) spine and (-2.9) femur on Actonel      Vitamin D deficiency     Back pain 07/18/2007     The patient is currently experiencing pain.      Location: middle back   Intensity (1-10): 6   Description: dull   Duration: constant  Fatigue (0-10): 7      Pain in joint, ankle and foot 10/05/2006    Malignant neoplasm of other specified sites of female breast 06/15/2006     DIAGNOSIS:  Left breast cancer, 3/07.     STAGE:  II-A;  T2N0M0, ER positive, HER-2/neu negative.     PAST THERAPY:  This 73 year old postmenopausal female developed pain in the medial side of the left chest wall in 2/07.  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 She underwent left lumpectomy and sentinel lymph node sampling on 06/07/05. Lumpectomy revealed a 2.1 x 2-cm invasive ductal carcinoma, grade II. No angiolymphatic invasion. There was perineural invasion. 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 Margins were clear. 0/2 sentinel lymph nodes were involved. Oncotype DX was sent and came back with a high recurrence score of 33, which corresponds to a 10-year rate of distant recurrence of 23% with adjuvant tamoxifen alone. For adjuvant chemotherapy, she received four cycles of dose-dense AC and 10 weeks of taxol. Taxol had to be stopped at 10 weeks due to grade 3 peripheral neuropathy. She  was placed on adjuvant Arimidex in 10/07.  Radiation therapy was given in [place], [place] and completed in December 2007.    PRESENT THERAPY:  Adjuvant Arimidex since 11/24/05.          Edema 02/28/2006         Review of Systems   Constitutional: Positive for malaise/fatigue.   HENT: Negative.     Eyes: Negative.    Cardiovascular:  Positive for dyspnea on exertion, irregular heartbeat, leg swelling and palpitations.   Respiratory: Negative.     Endocrine: Negative.    Hematologic/Lymphatic: Negative.    Skin: Negative.    Musculoskeletal:  Positive for back pain and falls.   Gastrointestinal: Negative.    Genitourinary: Negative.    Neurological:  Positive for dizziness and light-headedness.   Psychiatric/Behavioral: Negative.     Allergic/Immunologic: Negative.        Physical Exam    Physical Exam   General Appearance: no distress   Skin: warm, no ulcers or xanthomas   Digits and Nails: no cyanosis or clubbing   Eyes: conjunctivae and lids normal, pupils are equal and round   Teeth/Gums/Palate: dentition unremarkable, no lesions   Lips & Oral Mucosa: no pallor or cyanosis   Neck Veins: normal JVP , neck veins are not distended   Thyroid: no nodules, masses, tenderness or enlargement   Chest Inspection: chest is normal in appearance   Respiratory Effort: breathing comfortably, no respiratory distress   Auscultation/Percussion: lungs clear to auscultation, no rales or rhonchi, no wheezing   PMI: PMI not enlarged or displaced   Cardiac Rhythm: regular rhythm and normal rate   Cardiac Auscultation: S1, S2 normal, no rub, no gallop   Murmurs: no murmur   Peripheral Circulation: normal peripheral circulation   Carotid Arteries: normal carotid upstroke bilaterally, no bruits   Radial Arteries: normal symmetric radial pulses   Abdominal Aorta: no abdominal aortic bruit   Pedal Pulses: normal symmetric pedal pulses   Lower Extremity Edema: no lower extremity edema   Abdominal Exam: soft, non-tender, no masses, bowel sounds normal   Liver & Spleen: no organomegaly   Gait & Station: walks without assistance   Muscle Strength: normal muscle tone   Orientation: oriented to time, place and person   Affect & Mood: appropriate and sustained affect   Language and Memory: patient responsive and seems to comprehend information   Neurologic Exam: neurological assessment grossly intact   Other: moves all extremities      Cardiovascular Health Factors  Vitals BP Readings from Last 3 Encounters:   05/16/23 116/74   04/05/23 (!) 140/74   03/24/23 139/76     Wt Readings from Last 3 Encounters:   05/16/23 65 kg (143 lb 3.2 oz)   04/05/23 68.5 kg (151 lb)   03/24/23 68.8 kg (151 lb 9.6 oz)     BMI Readings from Last 3 Encounters:   05/16/23 25.77 kg/m?   04/05/23 27.18 kg/m?   03/24/23 27.73 kg/m?      Smoking Social History     Tobacco Use   Smoking Status Former    Current packs/day: 0.00    Average packs/day: 1 pack/day for 14.0 years (14.0 ttl pk-yrs)    Types: Cigarettes    Start date: 04/08/1986    Quit date: 04/07/2000    Years since quitting: 23.1   Smokeless Tobacco Never      Lipid Profile Cholesterol   Date Value Ref Range Status   03/08/2022 188  Final  HDL   Date Value Ref Range Status   03/08/2022 49  Final     LDL   Date Value Ref Range Status   03/08/2022 115 (H) <100 Final Triglycerides   Date Value Ref Range Status   03/08/2022 124  Final      Blood Sugar Hemoglobin A1C   Date Value Ref Range Status   09/27/2022 5.8  Final     Glucose   Date Value Ref Range Status   01/13/2023 106 (H) 70 - 105 Final   10/07/2022 94 70 - 100 MG/DL Final   16/11/9602 76  Final     Glucose, POC   Date Value Ref Range Status   03/24/2011 117 (H) 70 - 100 MG/DL Final          Problems Addressed Today  Encounter Diagnoses   Name Primary?    Atherosclerosis of native coronary artery of native heart without angina pectoris Yes    Left bundle branch block     PSVT (paroxysmal supraventricular tachycardia)        Assessment and Plan       Atherosclerosis of native coronary artery of native heart without angina pectoris  09/2022 - Cardiac cath showed mild CAD       Left bundle branch block  Echo in 2022:  EF 45%.  Due to technical challenges related to LBBB we did a RVG that came back with EF 50-55%.  She really hasn't heart failure presentation.     Most recent echo was 09/2022 showing EF 50-55%.    PSVT (paroxysmal supraventricular tachycardia)  Metoprolol XL 50/d and flecainide 50 BID.      Current Medications (including today's revisions)   aspirin (ASPIRIN CHILDRENS) 81 mg chewable tablet Chew one tablet by mouth daily.    atorvastatin (LIPITOR) 40 mg tablet Take one tablet by mouth daily. Indications: hardening of the arteries due to plaque buildup    CALCIUM CARBONATE (CALCIUM 500 PO) Take 1 Cap by mouth Daily.    Cholecalciferol (VITAMIN D3) 125 mcg (5,000 unit) capsule Take two capsules by mouth daily.    duloxetine DR (CYMBALTA) 60 mg capsule TAKE ONE CAPSULE BY MOUTH DAILY.    flecainide (TAMBOCOR) 50 mg tablet Take one tablet by mouth twice daily.    hydroxychloroquine (PLAQUENIL) 200 mg tablet TAKE 1 & 1/2 TABLETS BY MOUTH DAILY. TAKE WITH FOOD    leflunomide (ARAVA) 10 mg tablet TAKE ONE TABLET BY MOUTH DAILY.    metoprolol succinate XL (TOPROL XL) 50 mg extended release tablet Take one tablet by mouth daily.    nitroglycerin (NITROSTAT) 0.4 mg tablet Place 1 tab under tongue every 5 minutes as needed for Chest Pain (Not to exceed 3 doses /15 min. If pain persists , seek medical attention    pregabalin (LYRICA) 50 mg capsule TAKE ONE CAPSULE BY MOUTH THREE TIMES DAILY.    traMADoL (ULTRAM) 50 mg tablet Take one tablet by mouth every 4 hours as needed for Pain. Indications: neuropathic pain, pain    VITAMIN K2 PO Take 100 mg by mouth daily.     Total time spent on today's office visit was 30 minutes.  This includes face-to-face in person visit with patient as well as nonface-to-face time including review of the EMR, outside records, labs, radiologic studies, echocardiogram & other cardiovascular studies, formation of treatment plan, after visit summary, future disposition, and lastly on documentation.

## 2023-05-16 NOTE — Assessment & Plan Note
 09/2022 - Cardiac cath showed mild CAD

## 2023-05-16 NOTE — Assessment & Plan Note
 Metoprolol XL 50/d and flecainide 50 BID.

## 2023-05-16 NOTE — Assessment & Plan Note
 Echo in 2022:  EF 45%.  Due to technical challenges related to LBBB we did a RVG that came back with EF 50-55%.  She really hasn't heart failure presentation.     Most recent echo was 09/2022 showing EF 50-55%.

## 2023-05-18 ENCOUNTER — Encounter: Admit: 2023-05-18 | Discharge: 2023-05-18 | Primary: General Practice

## 2023-05-18 ENCOUNTER — Ambulatory Visit: Admit: 2023-05-18 | Discharge: 2023-05-18 | Primary: General Practice

## 2023-05-18 ENCOUNTER — Ambulatory Visit: Admit: 2023-05-18 | Discharge: 2023-05-19 | Primary: General Practice

## 2023-05-18 DIAGNOSIS — M47816 Spondylosis without myelopathy or radiculopathy, lumbar region: Secondary | ICD-10-CM

## 2023-05-18 MED ORDER — BUPIVACAINE (PF) 0.5 % (5 MG/ML) IJ SOLN
6 mL | Freq: Once | INTRAMUSCULAR | 0 refills | Status: CP
Start: 2023-05-18 — End: ?
  Administered 2023-05-18: 16:00:00 6 mL via INTRAMUSCULAR

## 2023-05-18 NOTE — Patient Instructions
 Discharge Instructions for Medial Branch Block    Important information following your procedure today: You may drive today    This injection is for diagnostic purposes, it is a test. Only short-term results are expected.    Though the procedure is generally safe and complications are rare, we do ask that you be aware of any of the following:   Any swelling, persistent redness, new bleeding, or drainage from the site of the injection.  You should not experience a severe headache.  You should not run a fever over 101? F.  New onset of sharp, severe back & or neck pain.  New onset of upper or lower extremity numbness or weakness.  New difficulty controlling bowel or bladder function after the injection.  New shortness of breath.    If any of these occur, please call to report this occurrence to Dr. Meredeth Stallion at [phone]. If you are calling after 4:00 p.m. or on weekends or holidays please call [phone] and ask to have the resident physician on call for the physician paged or go to your local emergency room.    Avoid application of direct heat, hot showers or hot tubs today.    Remain active today. Do the activities that would normally cause you pain. After the injection, you should get at least 80% relief for up to 2-4 hours.    Call back to report the results to a nurse tomorrow at Dr. Meredeth Stallion at [phone]. You may have to leave a message. Give your name and contact information and answer the following questions.  Did you get relief?  Percentage of relief?  How long did it last?    If you are a candidate, a scheduler will call you within the next week to schedule your next procedure. The nurse will call you back if your results were not as expected to discuss next steps in your treatment plan.      The following medications were used: Bupivicaine   and Contrast Dye      If you are unable to keep your upcoming appointment, please notify the [facility] scheduler at [phone] at least 24 hours in advance.

## 2023-05-18 NOTE — Procedures
Attending Surgeon: Jerilynn Som, MD    Anesthesia: Local    Pre-Procedure Diagnosis:   1. Lumbar spondylosis        Post-Procedure Diagnosis:   1. Lumbar spondylosis        [place] [facility] INJECT MBB LUMB/SACRAL  Procedure: medial branch block    Laterality: bilateral    Location: - L3, L5 and L4      Consent:   Consent obtained: written  Consent given by: patient  Risks discussed: allergic reaction, bleeding, infection, nerve damage, no change or worsening in pain, bruising and reaction to medication  Alternatives discussed: alternative treatment, delayed treatment, no treatment and referral  Discussed with patient the purpose of the treatment/procedure, other ways of treating my condition, including no treatment/ procedure and the risks and benefits of the alternatives. Patient has decided to proceed with treatment/procedure.   Procedures Details:   Indications: pain, diagnostic evaluation and Axial Pain   Prep: chlorhexidine  Patient position: prone  Estimated Blood Loss: minimal  Specimens: none  Number of Levels: 2  Guidance: fluoroscopy  Needle and Epidural Catheter: pencil-tip  Needle size: 25 G  Injection procedure: Incremental injection, Negative aspiration for blood and Introduced needle  Patient tolerance: Patient tolerated the procedure well with no immediate complications. Pressure was applied, and hemostasis was accomplished.  Outcome: Pain relieved and Pain improved  Comments: 1ml bupi inj at each site      Estimated blood loss: none or minimal  Specimens: none  Patient tolerated the procedure well with no immediate complications. Pressure was applied, and hemostasis was accomplished.

## 2023-05-18 NOTE — Progress Notes
 Pain Procedure Plan Of Care    Risk of injury related to procedure  Patient identification, allergies verified, fall precautions implemented    Risk of injury and impaired skin integrity  Positioning devices applied as appropriate for procedure, patient transported with staff assistance    Management of Pain  Pain assessment completed on arrival, PAR scoring following procedure and at discharge, sedation administered as ordered, patient positioned for comfort    Risk of anxiety related to procedure and disease process  Patient education on procedure and expectations, provide coping support to patient, provide relaxation techniques    Outcomes:  The patient is free of injury during and following their procedure.  Skin is intact and free of bruising.  The patients pain is managed during their stay.  Alleviation of patient anxiety exhibited.

## 2023-05-18 NOTE — Progress Notes
 [facility]  INTERVENTIONAL PAIN PROCEDURE HISTORY AND PHYSICAL    Chief Complaint: Pain    HISTORY OF PRESENT ILLNESS:  pain    Past Medical History:    Accidental fall    Arthritis    Atrial flutter (CMS-HCC)    Back pain    Back pain    Bladder infection, chronic    Cardiac dysrhythmia    Chest pain    COVID-19    Degenerative disc disease, cervical    Degenerative disc disease, lumbar    Depression    Dizziness    Dyspnea    Elevated triglycerides with high cholesterol    Environmental allergies    Essential hypertension    Genetic testing of female    GERD (gastroesophageal reflux disease)    H/O Sjogren's disease    Heart valve disease    Hx antineoplastic chemo    Hyperlipemia    Hypoglycemia    Joint pain    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)    Nerve injury    Neuropathy    Osteoporosis    Other malignant neoplasm without specification of site    Personal history of irradiation    Personal history of malignant neoplasm of breast    PN (Peripheral Neuropathy)    PONV (postoperative nausea and vomiting)    Right rib fracture    Sjogren's disease    Spinal headache    Valvular heart disease    Varicose veins    Vision decreased    Vitamin D deficiency       Surgical History:   Procedure Laterality Date    CARDIAC CATHERIZATION  03/28/2005    LAD 20%, Circ ostial 30%, EF 65%    HX BREAST LUMPECTOMY  06/07/2005    Left with SLNBX (0/2)    BREAST BIOPSY Left 06/07/2005    Malignant    COLONOSCOPY N/A 03/01/2010    normal ([name], MD-[facility])    HX HYSTERECTOMY  03/24/2011    [name], MD TL[facility]/BSO    BREAST BIOPSY Left 02/07/2014    Benign    Excision Posterior Neck mass N/A 09/04/2020    Performed by Flo Shanks [name]., MD at [facility] OR    anterior colporrhaphy (cystocele repair);  Posterior colporrhaphy (rectocele repair); N/A 11/13/2020    Performed by [name], MD at [facility] OR    CYSTOURETHROSCOPY N/A 11/13/2020    Performed by [name], MD at [facility] OR    *Painteq Sacroiliac Joint LinQ* PERCUTANEOUS/ MINIMALLY INVASIVE ARTHODESIS SACROILIAC JOINT WITH IMAGE GUIDANCE - WITH OR WITHOUT BONE GRAFT/ TRANSFIXING DEVICE  27279- 0755T Sacroiliac joint fusion via posterer approach Left 10/12/2021    Performed by Bella Kennedy, MD at [facility] OR    ANGIOGRAPHY CORONARY ARTERY WITH LEFT HEART CATHETERIZATION N/A 10/07/2022    Performed by Reola Mosher, MD at [facility] CATH LAB    PERCUTANEOUS CORONARY STENT PLACEMENT WITH ANGIOPLASTY N/A 10/07/2022    Performed by Reola Mosher, MD at [facility] CATH LAB    CHOLECYSTECTOMY  12/2022    CARDIOVASCULAR STRESS TEST  ?    HX BLADDER SUSPENSION      HX HEART CATHETERIZATION  2007    HX SALPINGO-OOPHORECTOMY      HX TONSILLECTOMY  1979    HX TUBAL LIGATION      HX WRIST FRACTURE SURGERY      KNEE SURGERY  ]999    LAMINECTOMY  01/02/23    LYMPH NODE BIOPSY      PR LAPAROSCOPY SURG RPR INITIAL INGUINAL HERNIA  1982    SURGERY  2923    Bone implanted    TUNNELED VENOUS PORT PLACEMENT      UMBILICAL ARTERIAL CATH - BEDSIDE  8/24       family history includes Arthritis in her father; Brain Tumor (age of onset: 72) in an other family member; COPD in her daughter; Cancer in her daughter; Essie Christine (age of onset: 88) in her paternal cousin; Cancer-Breast (age of onset: 33) in her paternal cousin; Cancer-Breast (age of onset: 46) in her paternal aunt; Cancer-Breast (age of onset: 63) in her paternal cousin; Cancer-Hematologic (age of onset: 57) in her daughter; Teresa Coombs (age of onset: 11) in her paternal uncle; Cancer-Lung (age of onset: 76) in her paternal cousin; Cancer-Ovarian (age of onset: 45) in her paternal cousin; Cancer-Ovarian (age of onset: 72) in her daughter; Complete Hysterectomy in her sister; Complete Hysterectomy (age of onset: 44) in her daughter; Coronary Artery Disease in her father; Depression (age of onset: 22) in her brother; Diabetes in her mother; Diabetes Type II in her maternal grandmother; Heart Attack (age of onset: 43) in her brother; Heart Disease in her father, mother, and sister; Heart murmur in her sister; Hypoglycemia in her mother; None Reported in her grandchild, grandchild, grandchild, grandchild, granddaughter, paternal cousin, and sister; Osteopenia in her sister; Other in her grandchild; Other (age of onset: 0) in her brother; Other (age of onset: 70) in her brother; Other (age of onset: 23) in her sister; Tobacco disorder in her daughter.    Social History     Socioeconomic History    Marital status: Divorced    Number of children: 2    Highest education level: GED or equivalent   Occupational History    Occupation: grounds Human resources officer: [name]   Tobacco Use    Smoking status: Former     Current packs/day: 0.00     Average packs/day: 1 pack/day for 14.0 years (14.0 ttl pk-yrs)     Types: Cigarettes     Start date: 04/08/1986     Quit date: 04/07/2000     Years since quitting: 23.1    Smokeless tobacco: Never   Vaping Use    Vaping status: Never Used   Substance and Sexual Activity    Alcohol use: Not Currently    Drug use: Never    Sexual activity: Not Currently     Partners: Male     Birth control/protection: Pill       Allergies   Allergen Reactions    Nickel RASH    Sulfa (Sulfonamide Antibiotics) RASH and NAUSEA AND VOMITING       Vitals:    05/18/23 0946   BP: (P) 128/69   BP Source: (P) Arm, Right Upper   Pulse: (P) 57   Temp: (P) 36.5 ?C (97.7 ?F)   SpO2: (P) 98%   TempSrc: (P) Oral   PainSc: (P) Eight   Weight: (P) 64.9 kg (143 lb)   Height: (P) 160 cm (5' 3)     Pain Score: (P) Eight  Oswestry Total Score:: (Patient-Rptd) 40    REVIEW OF SYSTEMS: 10 point ROS obtained and negative except pain      PHYSICAL EXAM:unchnaged        IMPRESSION:    1. Lumbar spondylosis         PLAN: Other bilat MBB

## 2023-05-19 ENCOUNTER — Encounter: Admit: 2023-05-19 | Discharge: 2023-05-19 | Payer: MEDICARE | Primary: General Practice

## 2023-05-19 DIAGNOSIS — M5136 Degeneration of intervertebral disc of lumbar region with discogenic back pain: Secondary | ICD-10-CM

## 2023-05-19 DIAGNOSIS — M47816 Spondylosis without myelopathy or radiculopathy, lumbar region: Secondary | ICD-10-CM

## 2023-05-19 NOTE — Telephone Encounter
 MBB #1 Results. DOS 05/18/23 LEFT L4-L5, L5-S1 MBB. Patient reports 80 % relief for 5 hours. Patient reports ability to increase activity levels, perform ADL's with less hindrance and decrease need for OTC pain medication. Once block wore off pt rated pain at 8/10. MBB #2 ordered.

## 2023-05-24 ENCOUNTER — Encounter: Admit: 2023-05-24 | Discharge: 2023-05-24 | Payer: MEDICARE | Primary: General Practice

## 2023-05-24 DIAGNOSIS — M51362 Degeneration of intervertebral disc of lumbar region with discogenic back pain and lower extremity pain: Secondary | ICD-10-CM

## 2023-05-24 DIAGNOSIS — M47816 Spondylosis without myelopathy or radiculopathy, lumbar region: Secondary | ICD-10-CM

## 2023-05-24 DIAGNOSIS — M51372 Degeneration of intervertebral disc of lumbosacral region with discogenic back pain and lower extremity pain: Secondary | ICD-10-CM

## 2023-05-24 DIAGNOSIS — M5136 Degeneration of intervertebral disc of lumbar region with discogenic back pain: Secondary | ICD-10-CM

## 2023-05-24 DIAGNOSIS — M5416 Radiculopathy, lumbar region: Secondary | ICD-10-CM

## 2023-05-25 ENCOUNTER — Encounter: Admit: 2023-05-25 | Discharge: 2023-05-25 | Payer: MEDICARE | Primary: General Practice

## 2023-06-01 ENCOUNTER — Encounter: Admit: 2023-06-01 | Discharge: 2023-06-01 | Payer: MEDICARE | Primary: General Practice

## 2023-06-01 ENCOUNTER — Ambulatory Visit: Admit: 2023-06-01 | Discharge: 2023-06-02 | Payer: MEDICARE | Primary: General Practice

## 2023-06-08 ENCOUNTER — Encounter: Admit: 2023-06-08 | Discharge: 2023-06-08 | Payer: MEDICARE | Primary: General Practice

## 2023-06-10 ENCOUNTER — Encounter: Admit: 2023-06-10 | Discharge: 2023-06-10 | Payer: MEDICARE | Primary: General Practice

## 2023-06-12 ENCOUNTER — Encounter: Admit: 2023-06-12 | Discharge: 2023-06-12 | Payer: MEDICARE | Primary: General Practice

## 2023-06-22 ENCOUNTER — Encounter: Admit: 2023-06-22 | Discharge: 2023-06-22 | Payer: MEDICARE | Primary: General Practice

## 2023-06-23 ENCOUNTER — Encounter: Admit: 2023-06-23 | Discharge: 2023-06-23 | Payer: MEDICARE | Primary: General Practice

## 2023-06-23 LAB — CBC AND DIFF

## 2023-06-26 ENCOUNTER — Encounter: Admit: 2023-06-26 | Discharge: 2023-06-26 | Payer: MEDICARE | Primary: General Practice

## 2023-06-26 MED ORDER — METOPROLOL SUCCINATE 50 MG PO TB24
50 mg | ORAL_TABLET | Freq: Every day | ORAL | 3 refills | 90.00000 days | Status: AC
Start: 2023-06-26 — End: ?

## 2023-06-28 ENCOUNTER — Encounter: Admit: 2023-06-28 | Discharge: 2023-06-28 | Payer: MEDICARE | Primary: General Practice

## 2023-06-29 ENCOUNTER — Encounter: Admit: 2023-06-29 | Discharge: 2023-06-29 | Payer: MEDICARE | Primary: General Practice

## 2023-06-29 ENCOUNTER — Ambulatory Visit: Admit: 2023-06-29 | Discharge: 2023-06-30 | Payer: MEDICARE | Primary: General Practice

## 2023-06-29 ENCOUNTER — Ambulatory Visit: Admit: 2023-06-29 | Discharge: 2023-06-29 | Payer: MEDICARE | Primary: General Practice

## 2023-06-29 DIAGNOSIS — M5136 Degeneration of intervertebral disc of lumbar region with discogenic back pain: Secondary | ICD-10-CM

## 2023-06-29 DIAGNOSIS — M47816 Spondylosis without myelopathy or radiculopathy, lumbar region: Secondary | ICD-10-CM

## 2023-06-29 MED ORDER — BUPIVACAINE (PF) 0.5 % (5 MG/ML) IJ SOLN
6 mL | Freq: Once | INTRAMUSCULAR | 0 refills | Status: CP
Start: 2023-06-29 — End: ?
  Administered 2023-06-29: 16:00:00 6 mL via INTRAMUSCULAR

## 2023-06-29 NOTE — Progress Notes
 Pain Procedure Plan Of Care    Risk of injury related to procedure  Patient identification, allergies verified, fall precautions implemented    Risk of injury and impaired skin integrity  Positioning devices applied as appropriate for procedure, patient transported with staff assistance    Management of Pain  Pain assessment completed on arrival, PAR scoring following procedure and at discharge, sedation administered as ordered, patient positioned for comfort    Risk of anxiety related to procedure and disease process  Patient education on procedure and expectations, provide coping support to patient, provide relaxation techniques    Outcomes:  The patient is free of injury during and following their procedure.  Skin is intact and free of bruising.  The patients pain is managed during their stay.  Alleviation of patient anxiety exhibited.

## 2023-06-29 NOTE — Patient Instructions
 Discharge Instructions for Medial Branch Block    Important information following your procedure today: You may drive today    This injection is for diagnostic purposes, it is a test. Only short-term results are expected.    Though the procedure is generally safe and complications are rare, we do ask that you be aware of any of the following:   Any swelling, persistent redness, new bleeding, or drainage from the site of the injection.  You should not experience a severe headache.  You should not run a fever over 101? F.  New onset of sharp, severe back & or neck pain.  New onset of upper or lower extremity numbness or weakness.  New difficulty controlling bowel or bladder function after the injection.  New shortness of breath.    If any of these occur, please call to report this occurrence to Dr. Welton Flakes at [phone]. If you are calling after 4:00 p.m. or on weekends or holidays please call [phone] and ask to have the resident physician on call for the physician paged or go to your local emergency room.    Avoid application of direct heat, hot showers or hot tubs today.    Remain active today. Do the activities that would normally cause you pain. After the injection, you should get at least 80% relief for up to 2-4 hours.    Call back to report the results to a nurse tomorrow at Dr. Welton Flakes at [phone]. You may have to leave a message. Give your name and contact information and answer the following questions.  Did you get relief?  Percentage of relief?  How long did it last?    If you are a candidate, a scheduler will call you within the next week to schedule your next procedure. The nurse will call you back if your results were not as expected to discuss next steps in your treatment plan.      The following medications were used: Bupivicaine        If you are unable to keep your upcoming appointment, please notify the [facility] scheduler at [phone] at least 24 hours in advance.

## 2023-06-29 NOTE — Procedures
 Attending Surgeon: Cordie Deters, MD    Anesthesia: Local      [place] [facility] SPINE INJECT MBB LUMB/SACRAL  Procedure: medial branch block    Laterality: bilateral    Location: - L3, L5 and L4      Consent:   Consent obtained: written  Consent given by: patient  Risks discussed: allergic reaction, bleeding, infection, nerve damage, no change or worsening in pain, bruising and reaction to medication  Alternatives discussed: alternative treatment, delayed treatment, no treatment and referral  Discussed with patient the purpose of the treatment/procedure, other ways of treating my condition, including no treatment/ procedure and the risks and benefits of the alternatives. Patient has decided to proceed with treatment/procedure.   Patient has had previous Medial Branch Block with greater than 80% relief that lasted more than 1 hour which is consistent with the local agent used.    Procedures Details:   Indications: pain, diagnostic evaluation and Axial Pain   Prep: chlorhexidine  Patient position: prone  Estimated Blood Loss: minimal  Specimens: none  Number of Levels: 2  Guidance: fluoroscopy  Needle and Epidural Catheter: pencil-tip  Needle size: 25 G  Injection procedure: Incremental injection, Negative aspiration for blood and Introduced needle  Patient tolerance: Patient tolerated the procedure well with no immediate complications. Pressure was applied, and hemostasis was accomplished.  Outcome: Pain relieved and Pain improved  Comments: 1ml bupi inj at each level    Estimated blood loss: none or minimal  Specimens: none  Patient tolerated the procedure well with no immediate complications. Pressure was applied, and hemostasis was accomplished.

## 2023-06-29 NOTE — Progress Notes
 [facility]  INTERVENTIONAL PAIN PROCEDURE HISTORY AND PHYSICAL    Chief Complaint: Pain    HISTORY OF PRESENT ILLNESS:  back pain    Past Medical History:    Accidental fall    Arthritis    Atrial flutter (CMS-HCC)    Back pain    Back pain    Bladder infection, chronic    Cardiac dysrhythmia    Cataract    Chest pain    COVID-19    Degenerative disc disease, cervical    Degenerative disc disease, lumbar    Depression    Diverticulitis    Dizziness    Dyspnea    Elevated triglycerides with high cholesterol    Environmental allergies    Essential hypertension    Genetic testing of female    GERD (gastroesophageal reflux disease)    H/O Sjogren's disease    Heart valve disease    Hx antineoplastic chemo    Hyperlipemia    Hypoglycemia    Joint pain    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)    Nerve injury    Neuropathy    Osteoporosis    Other malignant neoplasm without specification of site    Personal history of irradiation    Personal history of malignant neoplasm of breast    PN (Peripheral Neuropathy)    PONV (postoperative nausea and vomiting)    Right rib fracture    Sjogren's disease    Spinal headache    Valvular heart disease    Varicose veins    Vision decreased    Vitamin D deficiency       Surgical History:   Procedure Laterality Date    CARDIAC CATHERIZATION  03/28/2005    LAD 20%, Circ ostial 30%, EF 65%    HX BREAST LUMPECTOMY  06/07/2005    Left with SLNBX (0/2)    BREAST BIOPSY Left 06/07/2005    Malignant    COLONOSCOPY N/A 03/01/2010    normal (John T. Euel Herring, MD-[facility])    HX HYSTERECTOMY  03/24/2011    Lucky Sable, MD TLH/BSO    BREAST BIOPSY Left 02/07/2014    Benign    Excision Posterior Neck mass N/A 09/04/2020    Performed by Johnnie Nan Gill Lace., MD at [facility] OR    anterior colporrhaphy (cystocele repair);  Posterior colporrhaphy (rectocele repair); N/A 11/13/2020    Performed by Kyung Pi, MD at [facility] OR    CYSTOURETHROSCOPY N/A 11/13/2020    Performed by Kyung Pi, MD at [facility] OR    *Painteq Sacroiliac Joint LinQ* PERCUTANEOUS/ MINIMALLY INVASIVE ARTHODESIS SACROILIAC JOINT WITH IMAGE GUIDANCE - WITH OR WITHOUT BONE GRAFT/ TRANSFIXING DEVICE  27279- 0755T Sacroiliac joint fusion via posterer approach Left 10/12/2021    Performed by Alban Hudson, MD at [facility] OR    ANGIOGRAPHY CORONARY ARTERY WITH LEFT HEART CATHETERIZATION N/A 10/07/2022    Performed by Kraig Peru, MD at [facility] CATH LAB    PERCUTANEOUS CORONARY STENT PLACEMENT WITH ANGIOPLASTY N/A 10/07/2022    Performed by Kraig Peru, MD at [facility] CATH LAB    CHOLECYSTECTOMY  12/2022    CARDIOVASCULAR STRESS TEST  ?    HX BLADDER SUSPENSION      HX HEART CATHETERIZATION  2007    HX SALPINGO-OOPHORECTOMY      HX TONSILLECTOMY  1979    HX TUBAL LIGATION      HX WRIST FRACTURE SURGERY  KNEE SURGERY  ]999    LAMINECTOMY  01/02/23    LYMPH NODE BIOPSY      PR LAPAROSCOPY SURG RPR INITIAL INGUINAL HERNIA  1982    SURGERY  2923    Bone implanted    TUNNELED VENOUS PORT PLACEMENT      UMBILICAL ARTERIAL CATH - BEDSIDE  8/24       family history includes Arthritis in her father; Brain Tumor (age of onset: 98) in an other family member; COPD in her daughter; Cancer in her daughter; Kandra Orn (age of onset: 43) in her paternal cousin; Cancer-Breast (age of onset: 27) in her paternal cousin; Cancer-Breast (age of onset: 75) in her paternal aunt; Cancer-Breast (age of onset: 46) in her paternal cousin; Cancer-Hematologic (age of onset: 24) in her daughter; Daleen Dubs (age of onset: 60) in her paternal uncle; Cancer-Lung (age of onset: 3) in her paternal cousin; Cancer-Ovarian (age of onset: 27) in her paternal cousin; Cancer-Ovarian (age of onset: 43) in her daughter; Complete Hysterectomy in her sister; Complete Hysterectomy (age of onset: 36) in her daughter; Coronary Artery Disease in her father; Depression (age of onset: 48) in her brother; Diabetes in her mother; Diabetes Type II in her maternal grandmother; Heart Attack (age of onset: 18) in her brother; Heart Disease in her father, mother, and sister; Heart murmur in her sister; Hypoglycemia in her mother; None Reported in her grandchild, grandchild, grandchild, grandchild, granddaughter, paternal cousin, and sister; Osteopenia in her sister; Other in her grandchild; Other (age of onset: 0) in her brother; Other (age of onset: 74) in her brother; Other (age of onset: 50) in her sister; Tobacco disorder in her daughter.    Social History     Socioeconomic History    Marital status: Divorced    Number of children: 2    Highest education level: GED or equivalent   Occupational History    Occupation: grounds Human resources officer: [name]   Tobacco Use    Smoking status: Former     Current packs/day: 0.00     Average packs/day: 1 pack/day for 14.0 years (14.0 ttl pk-yrs)     Types: Cigarettes     Start date: 04/08/1986     Quit date: 04/07/2000     Years since quitting: 23.2    Smokeless tobacco: Never   Vaping Use    Vaping status: Never Used   Substance and Sexual Activity    Alcohol use: Not Currently    Drug use: Never    Sexual activity: Not Currently     Partners: Male     Birth control/protection: Pill       Allergies   Allergen Reactions    Nickel RASH    Sulfa (Sulfonamide Antibiotics) RASH and NAUSEA AND VOMITING       Vitals:    06/29/23 0959   BP: 135/80   BP Source: Arm, Right Upper   Pulse: 60   Temp: 36.7 ?C (98.1 ?F)   SpO2: 98%   TempSrc: Oral   PainSc: Four   Weight: 64.9 kg (143 lb)   Height: 161 cm (5' 3.39)     Pain Score: Four  Oswestry Total Score:: (Patient-Rptd) 40    REVIEW OF SYSTEMS: 10 point ROS obtained and negative except pain      PHYSICAL EXAM:unchnaged        IMPRESSION:    1. Lumbar spondylosis    2. Degeneration of intervertebral disc of lumbar region with discogenic  back pain         PLAN: Other Bilat LMBB

## 2023-06-30 ENCOUNTER — Encounter: Admit: 2023-06-30 | Discharge: 2023-06-30 | Payer: MEDICARE | Primary: General Practice

## 2023-06-30 DIAGNOSIS — M47816 Spondylosis without myelopathy or radiculopathy, lumbar region: Secondary | ICD-10-CM

## 2023-07-01 ENCOUNTER — Encounter: Admit: 2023-07-01 | Discharge: 2023-07-01 | Payer: MEDICARE | Primary: General Practice

## 2023-07-12 ENCOUNTER — Encounter: Admit: 2023-07-12 | Discharge: 2023-07-12 | Payer: MEDICARE | Primary: General Practice

## 2023-07-12 MED ORDER — LEFLUNOMIDE 10 MG PO TAB
10 mg | ORAL_TABLET | Freq: Every day | ORAL | 1 refills | 90.00000 days | Status: AC
Start: 2023-07-12 — End: ?

## 2023-07-12 NOTE — Telephone Encounter
 Refill of leflunomide requested. Patient last seen 01/12/23 and scheduled for follow-up 08/14/23.    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    Last labs 06/23/23    Refilled per rheumatology refill protocol.

## 2023-07-14 ENCOUNTER — Encounter: Admit: 2023-07-14 | Discharge: 2023-07-14 | Payer: MEDICARE | Primary: General Practice

## 2023-07-17 ENCOUNTER — Encounter: Admit: 2023-07-17 | Discharge: 2023-07-17 | Payer: MEDICARE | Primary: General Practice

## 2023-07-17 NOTE — Telephone Encounter
 This RN contacted the patient for pre-visit questions regarding their procedure with Dr. Meredeth Stallion The following information was reviewed:    Your procedure is scheduled for 07/27/23 with a check in time of 10:00. Please have nothing to eat 6 hours prior to the procedure. You may have clear liquids up to 2 hours prior to the procedure. You should take all your medications as prescribed with sips of water. You will need to bring a driver.      Please contact our procedure clinic at [phone] with any questions. The patient verbalized understanding.

## 2023-07-18 ENCOUNTER — Encounter: Admit: 2023-07-18 | Discharge: 2023-07-18 | Payer: MEDICARE | Primary: General Practice

## 2023-07-18 DIAGNOSIS — M3500 Sicca syndrome, unspecified: Secondary | ICD-10-CM

## 2023-07-18 DIAGNOSIS — Z79899 Other long term (current) drug therapy: Secondary | ICD-10-CM

## 2023-07-18 DIAGNOSIS — Z7969 Long term (current) use of other immunomodulators and immunosuppressants: Secondary | ICD-10-CM

## 2023-07-18 NOTE — Telephone Encounter
 Eye exam received from [facility], patient last seen 05/24/23, records normal. Document added to image now to be scanned into EMR

## 2023-07-20 ENCOUNTER — Encounter: Admit: 2023-07-20 | Discharge: 2023-07-20 | Payer: MEDICARE | Primary: General Practice

## 2023-07-20 NOTE — Telephone Encounter
Attempted to call patient, LVM requesting callback. Mychart message sent to patient.

## 2023-07-20 NOTE — Telephone Encounter
-----   Message from Marcille Severance, MD sent at 07/20/2023  9:04 AM CDT -----  Minimal LFT elevation noted on labs from May, would like her to repeat LFTs next week and call back to discuss the results, she may continue leflunomide for now but we may have to adjust the dose or   stop if this worsens  ----- Message -----  From: Leila Punt  Sent: 07/18/2023  12:59 PM CDT  To: Barton Bos, MD

## 2023-07-23 ENCOUNTER — Encounter: Admit: 2023-07-23 | Discharge: 2023-07-23 | Payer: MEDICARE | Primary: General Practice

## 2023-07-25 ENCOUNTER — Encounter: Admit: 2023-07-25 | Discharge: 2023-07-25 | Payer: MEDICARE | Primary: General Practice

## 2023-07-27 ENCOUNTER — Ambulatory Visit: Admit: 2023-07-27 | Discharge: 2023-07-28 | Payer: MEDICARE | Primary: General Practice

## 2023-07-27 ENCOUNTER — Ambulatory Visit: Admit: 2023-07-27 | Discharge: 2023-07-27 | Payer: MEDICARE | Primary: General Practice

## 2023-07-27 ENCOUNTER — Encounter: Admit: 2023-07-27 | Discharge: 2023-07-27 | Payer: MEDICARE | Primary: General Practice

## 2023-07-27 DIAGNOSIS — M47816 Spondylosis without myelopathy or radiculopathy, lumbar region: Secondary | ICD-10-CM

## 2023-07-27 MED ORDER — FENTANYL CITRATE (PF) 50 MCG/ML IJ SOLN
25 ug | INTRAVENOUS | 0 refills | Status: DC | PRN
Start: 2023-07-27 — End: 2023-07-27
  Administered 2023-07-27 (×2): 25 ug via INTRAVENOUS

## 2023-07-27 MED ORDER — LIDOCAINE (PF) 20 MG/ML (2 %) IJ SOLN
5 mL | Freq: Once | INTRAMUSCULAR | 0 refills | Status: CP
Start: 2023-07-27 — End: ?
  Administered 2023-07-27: 17:00:00 100 mg via INTRAMUSCULAR

## 2023-07-27 MED ORDER — MIDAZOLAM 1 MG/ML IJ SOLN
1 mg | INTRAVENOUS | 0 refills | Status: DC | PRN
Start: 2023-07-27 — End: 2023-07-27
  Administered 2023-07-27 (×2): 1 mg via INTRAVENOUS

## 2023-07-27 NOTE — Procedures
 Attending Surgeon: Cordie Deters, MD    Anesthesia: Local      DESTRUCTION OF NERVE W/ FLUORO LUMBAR/SACRAL    Laterality: bilateral    Location: Lumbar/Sacral - L3, L4 and L5      Consent:   Consent obtained: written  Consent given by: patient    Discussed with patient the purpose of the treatment/procedure, other ways of treating my condition, including no treatment/ procedure and the risks and benefits of the alternatives. Patient has decided to proceed with treatment/procedure.   Patient has had 2 Medial Branch Blocks with greater than 80% relief that lasted more than 1 hour which is consistent with the local agent used.     Procedures Details:   Indications: axial pain and pain     Prep: chlorhexidine   Local anesthetic:  1% lidocaine  - 1mL  Sedation: moderate sedation  Sedation Medications: Versed  3 mg and Fentanyl  100 mcg  Patient position: prone  Estimated Blood Loss: minimal  Specimens: none  Number of Levels Ablated: 2  Approach: left paramedian  Guidance: fluoroscopy  Needle and Epidural Catheter: long bevel  Needle size: 18 G  Active Needle Tip Length: 10mm  Neurolytic Technique: Radiofrequency Ablation      Injection procedure: Incremental injection, Negative aspiration for blood and Introduced needle  Patient tolerance: Patient tolerated the procedure well with no immediate complications. Pressure was applied, and hemostasis was accomplished.  Outcome: Pain relieved and Pain improved      Radiofrequency time 90  Radiofrequency Temperature 80  Post Procedure Pain: 3  Estimated blood loss: none or minimal  Specimens: none  Patient tolerated the procedure well with no immediate complications. Pressure was applied, and hemostasis was accomplished.

## 2023-07-27 NOTE — Progress Notes
 Sedation physician present in room. Recent vitals and patient condition reviewed between sedating physician and nurse. Reassessment completed. Determination made to proceed with planned sedation.

## 2023-07-27 NOTE — Progress Notes
 [facility]  INTERVENTIONAL PAIN PROCEDURE HISTORY AND PHYSICAL    Chief Complaint: Pain    HISTORY OF PRESENT ILLNESS:  back pain    Past Medical History:    Accidental fall    Arthritis    Atrial flutter (CMS-HCC)    Back pain    Back pain    Bladder infection, chronic    Cardiac dysrhythmia    Cataract    Chest pain    COVID-19    Degenerative disc disease, cervical    Degenerative disc disease, lumbar    Depression    Diverticulitis    Diverticulitis of colon (without mention of hemorrhage)(562.11)    Dizziness    Dyspnea    Elevated triglycerides with high cholesterol    Environmental allergies    Essential hypertension    Genetic testing of female    GERD (gastroesophageal reflux disease)    H/O Sjogren's disease    Heart valve disease    Hx antineoplastic chemo    Hyperlipemia    Hypoglycemia    Joint pain    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)    Nerve injury    Neuropathy    Osteoporosis    Other malignant neoplasm without specification of site    Personal history of irradiation    Personal history of malignant neoplasm of breast    PN (Peripheral Neuropathy)    PONV (postoperative nausea and vomiting)    Right rib fracture    Sjogren's disease    Spinal headache    Valvular heart disease    Varicose veins    Vision decreased    Vitamin D deficiency       Surgical History:   Procedure Laterality Date    CARDIAC CATHERIZATION  03/28/2005    LAD 20%, Circ ostial 30%, EF 65%    HX BREAST LUMPECTOMY  06/07/2005    Left with SLNBX (0/2)    BREAST BIOPSY Left 06/07/2005    Malignant    COLONOSCOPY N/A 03/01/2010    normal (John T. Flavio, [facility])    HX HYSTERECTOMY  03/24/2011    Damien Sandy, MD [facility]/BSO    BREAST BIOPSY Left 02/07/2014    Benign    Excision Posterior Neck mass N/A 09/04/2020    Performed by Claudean Elsie LITTIE Raddle., MD at [facility] OR    anterior colporrhaphy (cystocele repair);  Posterior colporrhaphy (rectocele repair); N/A 11/13/2020 Performed by Ronold Hendrick LITTIE, MD at [facility] OR    CYSTOURETHROSCOPY N/A 11/13/2020    Performed by Ronold Hendrick LITTIE, MD at [facility] OR    *Painteq Sacroiliac Joint LinQ* PERCUTANEOUS/ MINIMALLY INVASIVE ARTHODESIS SACROILIAC JOINT WITH IMAGE GUIDANCE - WITH OR WITHOUT BONE GRAFT/ TRANSFIXING DEVICE  27279- [id number] Sacroiliac joint fusion via posterer approach Left 10/12/2021    Performed by Paulita Horning, MD at [facility] OR    ANGIOGRAPHY CORONARY ARTERY WITH LEFT HEART CATHETERIZATION N/A 10/07/2022    Performed by Erling Camellia RAMAN, MD at [facility] CATH LAB    PERCUTANEOUS CORONARY STENT PLACEMENT WITH ANGIOPLASTY N/A 10/07/2022    Performed by Erling Camellia RAMAN, MD at [facility] CATH LAB    CHOLECYSTECTOMY  12/2022    CARDIOVASCULAR STRESS TEST  ?    HX BLADDER SUSPENSION      HX HEART CATHETERIZATION  2007    HX SALPINGO-OOPHORECTOMY      HX TONSILLECTOMY  1979    HX TUBAL LIGATION      HX  WRIST FRACTURE SURGERY      KNEE SURGERY  ]999    LAMINECTOMY  01/02/23    LYMPH NODE BIOPSY      PR LAPAROSCOPY SURG RPR INITIAL INGUINAL HERNIA  1982    SURGERY  2923    Bone implanted    TUNNELED VENOUS PORT PLACEMENT      UMBILICAL ARTERIAL CATH - BEDSIDE  8/24       family history includes Arthritis in her father; Brain Tumor (age of onset: 79) in an other family member; COPD in her daughter; Cancer in her daughter; Trent (age of onset: 48) in her paternal cousin; Cancer-Breast (age of onset: 51) in her paternal cousin; Cancer-Breast (age of onset: 23) in her paternal aunt; Cancer-Breast (age of onset: 23) in her paternal cousin; Cancer-Hematologic (age of onset: 61) in her daughter; [place] (age of onset: 41) in her paternal uncle; Cancer-Lung (age of onset: 87) in her paternal cousin; Cancer-Ovarian (age of onset: 6) in her paternal cousin; Cancer-Ovarian (age of onset: 74) in her daughter; Complete Hysterectomy in her sister; Complete Hysterectomy (age of onset: 32) in her daughter; Coronary Artery Disease in her father; Depression (age of onset: 37) in her brother; Diabetes in her mother; Diabetes Type II in her maternal grandmother; Heart Attack (age of onset: 9) in her brother; Heart Disease in her father, mother, and sister; Heart murmur in her sister; Hypoglycemia in her mother; None Reported in her grandchild, grandchild, grandchild, grandchild, granddaughter, paternal cousin, and sister; Osteopenia in her sister; Other in her grandchild; Other (age of onset: 0) in her brother; Other (age of onset: 4) in her brother; Other (age of onset: 46) in her sister; Tobacco disorder in her daughter.    Social History     Socioeconomic History    Marital status: Divorced    Number of children: 2    Highest education level: GED or equivalent   Occupational History    Occupation: grounds Human resources officer: [name]   Tobacco Use    Smoking status: Former     Current packs/day: 0.00     Average packs/day: 1 pack/day for 14.0 years (14.0 ttl pk-yrs)     Types: Cigarettes     Start date: 04/08/1986     Quit date: 04/07/2000     Years since quitting: 23.3    Smokeless tobacco: Never   Vaping Use    Vaping status: Never Used   Substance and Sexual Activity    Alcohol use: Not Currently    Drug use: Never    Sexual activity: Not Currently     Partners: Male     Birth control/protection: Pill       Allergies   Allergen Reactions    Nickel RASH    Sulfa (Sulfonamide Antibiotics) RASH and NAUSEA AND VOMITING       Vitals:    07/27/23 1023   BP: (!) 144/71   BP Source: Arm, Right Upper   Pulse: 62   Resp: 14   SpO2: 96%   PainSc: Eight     Pain Score: Eight  Oswestry Total Score:: (Patient-Rptd) 42    REVIEW OF SYSTEMS: 10 point ROS obtained and negative except pain      PHYSICAL EXAM:unchnaged        IMPRESSION:    1. Lumbar spondylosis         PLAN: Other left and rt lum rfa  General Pre Procedural Sedation ASA Classification  I have discussed risks and alternatives of this type of sedation and procedure with: patient    NPO Status:Acceptable    Pregnancy Status: Not applicable    Prior Anesthetic Types: Moderate sedation and Anxiolysis    Airway: Airway assessment performed II (soft palate, uvula, fauces visible)    Head and Neck: No abnormalities noted    Mouth: No abnormalities noted    Medications for Procedural Sedation: Midazolam and Fentanyl    Anesthesia Classification:  ASA II (A normal patient with mild systemic disease)    Patient remains a candidate for procedure: Yes    The intention for the procedure today is Anxiolysis/Analgesia.

## 2023-07-27 NOTE — Progress Notes
 Pain Procedure Plan Of Care    Risk of injury related to procedure  Patient identification, allergies verified, fall precautions implemented    Risk of injury and impaired skin integrity  Positioning devices applied as appropriate for procedure, patient transported with staff assistance    Management of Pain  Pain assessment completed on arrival, PAR scoring following procedure and at discharge, sedation administered as ordered, patient positioned for comfort    Risk of anxiety related to procedure and disease process  Patient education on procedure and expectations, provide coping support to patient, provide relaxation techniques    Outcomes:  The patient is free of injury during and following their procedure.  Skin is intact and free of bruising.  The patients pain is managed during their stay.  Alleviation of patient anxiety exhibited.

## 2023-08-04 LAB — COMPREHENSIVE METABOLIC PANEL

## 2023-08-07 ENCOUNTER — Encounter: Admit: 2023-08-07 | Discharge: 2023-08-07 | Payer: MEDICARE | Primary: General Practice

## 2023-08-07 DIAGNOSIS — M3500 Sicca syndrome, unspecified: Principal | ICD-10-CM

## 2023-08-07 DIAGNOSIS — Z7969 Long term (current) use of other immunomodulators and immunosuppressants: Secondary | ICD-10-CM

## 2023-08-07 DIAGNOSIS — Z79899 Other long term (current) drug therapy: Secondary | ICD-10-CM

## 2023-08-14 ENCOUNTER — Ambulatory Visit: Admit: 2023-08-14 | Discharge: 2023-08-15 | Payer: MEDICARE | Primary: General Practice

## 2023-08-14 ENCOUNTER — Encounter: Admit: 2023-08-14 | Discharge: 2023-08-14 | Payer: MEDICARE | Primary: General Practice

## 2023-08-14 DIAGNOSIS — M3500 Sicca syndrome, unspecified: Secondary | ICD-10-CM

## 2023-08-14 MED ORDER — TRAZODONE 50 MG PO TAB
50 mg | ORAL_TABLET | Freq: Every evening | ORAL | 0 refills | 30.00000 days | Status: AC | PRN
Start: 2023-08-14 — End: ?

## 2023-08-14 NOTE — Progress Notes
 Telehealth:    Kristen Norris  is a 73 y.o. Caucasian female here for a follow-up visit    Chief complaint: Management of seronegative Sjogren's disease      Interval history:  Last seen evaluated in December at which time she was doing well on a combination of hydroxychloroquine  and leflunomide  to manage her Sjogren's with inflammatory arthropathy, her follow-up labs in May 2025 revealed mildly elevated AST and ALT which subsequently resolved in June, CBC remained stable  She now reports that she was anxious because of her liver enzyme elevation so she stopped therapy on her own and has not resumed treatment, fortunately well off on therapy for several weeks now she does not report recurrent inflammatory joint pain of the hands or other areas, she also remained on hydroxychloroquine  200 mg/day, we previously devised 300 mg which did not switch to, she reports noninflammatory joint pain in different areas including left rotator cuff tendinopathy for which she received steroid injection by her orthopedic surgeon, she had epidural injection lumbar spine left side by Dr. 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, there is no surgery planned for now, she also has been dealing with complications from rectocele and cystocele which were previously operated on, she has been dealing with unprovoked diarrhea and bowel incontinence for which she was evaluated by her surgeon and was told that she has significant dryness of her anal mucosa, advised to use lubricants, she denies inflammatory joint pain of the hands or rashes, no parotitis or respiratory symptoms    Physical exam  BP (!) 140/70 (BP Source: Arm, Right Upper, Patient Position: Sitting)  - Pulse 55  - Temp 36.3 ?C (97.3 ?F) (Temporal)  - Resp 16  - Ht 160 cm (5' 3)  - Wt 64.4 kg (142 lb)  - LMP  (LMP Unknown)  - SpO2 98%  - BMI 25.15 kg/m?    Very pleasant lady no acute distress    Schirmer's test  OS 3 mm  OD 4 mm     Unstimulated salivary flow rate= 0.16 ml/min                                   Assessment and plan:    1.  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, myalgia and arthralgia, objective evidence of dry eyes and dry mouth, positive ANA and rheumatoid factor and a positive lip biopsy with focus score of 2    She is maintained on hydroxychloroquine  since August 2020, using 200 mg daily dose  With treated her with leflunomide  in early 2024 due to inflammatory arthropathy, she did well on treatment but more recent testing did reveal minimal elevation of liver enzymes and she was worried and subsequently held the treatment, she is doing well now and remains on hydroxychloroquine  at the same dose 200 mg daily, and does not exhibit signs and symptoms of active inflammatory arthropathy, her symptoms may however return in the future, and she will let me know, I explained that as long as we keep an eye on her liver enzymes we can resume treatment safely that we have certain criteria to follow with regard to mild elevation of liver enzymes the context of leflunomide  use,  I reassured her that we will continue to closely monitor these labs to ensure safety if we decide to resume therapy and she is breast understanding  She will remain on the same dose of hydroxychloroquine  and have annual eye exams    2. Osteoporosis: she was on prolia but could not afford it after 2 doses, Fosamax  was started in January 2021 but stopped due to epigastric pain  Started Reclast  Sep 2022   Last dose in September 2024, follow up DEXA scan following last years dose revealed mild improvement, she has had what sounds like pathologic fracture of the patella, for this reason I will go ahead and refer her to the osteoporosis clinic for further care      3. Generalized osteoarthritis     4. Long term Hydroxychloroquine  use since mid 2020, last eye exam was in April 2025    5. 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 History of Breast cancer: Diagnosed in 2007, in remission since       Plan:  Referred to the osteoporosis clinic  Do not resume leflunomide  at this point in time but will consider resuming therapy if arthritis recur  Continue hydroxychloroquine  200 mg daily, may consider higher doses moving forward      Return to see Leita in about 6 months    Lindwood Medley, MD    Because this dictation was prepared with voice recognition software, there remains a potential for typographical errors or incorrect word choices by the system. We apologize for any inadvertent inconvenience from such an error.    My

## 2023-08-15 DIAGNOSIS — M8000XA Age-related osteoporosis with current pathological fracture, unspecified site, initial encounter for fracture: Principal | ICD-10-CM

## 2023-09-06 ENCOUNTER — Encounter: Admit: 2023-09-06 | Discharge: 2023-09-06 | Payer: MEDICARE | Primary: General Practice

## 2023-09-19 ENCOUNTER — Encounter: Admit: 2023-09-19 | Discharge: 2023-09-19 | Payer: MEDICARE | Primary: General Practice

## 2023-09-19 ENCOUNTER — Ambulatory Visit: Admit: 2023-09-19 | Discharge: 2023-09-20 | Payer: MEDICARE | Primary: General Practice

## 2023-09-20 ENCOUNTER — Encounter: Admit: 2023-09-20 | Discharge: 2023-09-20 | Payer: MEDICARE | Primary: General Practice

## 2023-09-21 ENCOUNTER — Encounter: Admit: 2023-09-21 | Discharge: 2023-09-21 | Payer: MEDICARE | Primary: General Practice

## 2023-09-22 ENCOUNTER — Encounter: Admit: 2023-09-22 | Discharge: 2023-09-22 | Payer: MEDICARE | Primary: General Practice

## 2023-09-23 MED FILL — RXAMB NALTREXONE 1 MG/ML ORAL SUSPENSION (BATCHED COMPOUND): ORAL | 37 days supply | Qty: 0.06 | Fill #0 | Status: AC

## 2023-10-02 ENCOUNTER — Encounter: Admit: 2023-10-02 | Discharge: 2023-10-02 | Payer: MEDICARE | Primary: General Practice

## 2023-10-02 DIAGNOSIS — M25562 Pain in left knee: Secondary | ICD-10-CM

## 2023-10-02 DIAGNOSIS — M7062 Trochanteric bursitis, left hip: Principal | ICD-10-CM

## 2023-10-02 NOTE — Progress Notes
 RN faxed updated PT referrral for Left knee and left hip pain. Faxed to [facility] in [place], [place] [phone] with face sheet, office visit notes and included new PT referral.

## 2023-10-04 ENCOUNTER — Encounter: Admit: 2023-10-04 | Discharge: 2023-10-04 | Payer: MEDICARE | Primary: General Practice

## 2023-10-04 ENCOUNTER — Ambulatory Visit: Admit: 2023-10-04 | Discharge: 2023-10-05 | Payer: MEDICARE | Primary: General Practice

## 2023-10-05 ENCOUNTER — Encounter: Admit: 2023-10-05 | Discharge: 2023-10-05 | Payer: MEDICARE | Primary: General Practice

## 2023-10-14 ENCOUNTER — Encounter: Admit: 2023-10-14 | Discharge: 2023-10-14 | Payer: MEDICARE | Primary: General Practice

## 2023-10-16 ENCOUNTER — Encounter: Admit: 2023-10-16 | Discharge: 2023-10-16 | Payer: MEDICARE | Primary: General Practice

## 2023-10-16 MED ORDER — HYDROXYCHLOROQUINE 200 MG PO TAB
ORAL_TABLET | ORAL | 1 refills | 90.00000 days | Status: AC
Start: 2023-10-16 — End: ?

## 2023-10-16 NOTE — Telephone Encounter
 Refill of hcq requested. Patient last seen 08/14/23 and scheduled for follow-up 03/05/24.    Per LOV notes, Plan:  Referred to the osteoporosis clinic  Do not resume leflunomide  at this point in time but will consider resuming therapy if arthritis recur  Continue hydroxychloroquine  200 mg daily, may consider higher doses moving forward        Return to see Leita in about 6 months     Lindwood Medley, MD.    Last eye exam 05/24/23    Refilled per rheumatology refill protocol.

## 2023-10-18 ENCOUNTER — Encounter: Admit: 2023-10-18 | Discharge: 2023-10-18 | Payer: MEDICARE | Primary: General Practice

## 2023-10-18 ENCOUNTER — Ambulatory Visit: Admit: 2023-10-18 | Discharge: 2023-10-19 | Payer: MEDICARE | Primary: General Practice

## 2023-10-19 ENCOUNTER — Encounter: Admit: 2023-10-19 | Discharge: 2023-10-19 | Payer: MEDICARE | Primary: General Practice

## 2023-10-20 ENCOUNTER — Encounter: Admit: 2023-10-20 | Discharge: 2023-10-20 | Payer: MEDICARE | Primary: General Practice

## 2023-10-24 ENCOUNTER — Encounter: Admit: 2023-10-24 | Discharge: 2023-10-24 | Payer: MEDICARE | Primary: General Practice

## 2023-10-25 ENCOUNTER — Encounter: Admit: 2023-10-25 | Discharge: 2023-10-25 | Payer: MEDICARE | Primary: General Practice

## 2023-10-25 MED FILL — RXAMB NALTREXONE 1 MG/ML ORAL SUSPENSION (BATCHED COMPOUND): ORAL | 30 days supply | Qty: 0.09 | Fill #0 | Status: AC

## 2023-10-26 ENCOUNTER — Ambulatory Visit: Admit: 2023-10-26 | Discharge: 2023-10-26 | Payer: MEDICARE | Primary: General Practice

## 2023-10-26 ENCOUNTER — Encounter: Admit: 2023-10-26 | Discharge: 2023-10-26 | Payer: MEDICARE | Primary: General Practice

## 2023-10-27 ENCOUNTER — Encounter: Admit: 2023-10-27 | Discharge: 2023-10-27 | Payer: MEDICARE | Primary: General Practice

## 2023-10-30 ENCOUNTER — Encounter: Admit: 2023-10-30 | Discharge: 2023-10-30 | Payer: MEDICARE | Primary: General Practice

## 2023-10-30 MED ORDER — LEFLUNOMIDE 10 MG PO TAB
10 mg | ORAL_TABLET | Freq: Every day | ORAL | 1 refills | 90.00000 days | Status: AC
Start: 2023-10-30 — End: ?

## 2023-11-04 ENCOUNTER — Ambulatory Visit: Admit: 2023-11-04 | Discharge: 2023-11-04 | Payer: MEDICARE | Primary: General Practice

## 2023-11-04 ENCOUNTER — Encounter: Admit: 2023-11-04 | Discharge: 2023-11-04 | Payer: MEDICARE | Primary: General Practice

## 2023-11-05 ENCOUNTER — Encounter: Admit: 2023-11-05 | Discharge: 2023-11-05 | Payer: MEDICARE | Primary: General Practice

## 2023-11-08 ENCOUNTER — Encounter: Admit: 2023-11-08 | Discharge: 2023-11-08 | Payer: MEDICARE | Primary: General Practice

## 2023-11-16 ENCOUNTER — Encounter: Admit: 2023-11-16 | Discharge: 2023-11-16 | Payer: MEDICARE | Primary: General Practice

## 2023-11-16 DIAGNOSIS — Z7969 Long term (current) use of other immunomodulators and immunosuppressants: Secondary | ICD-10-CM

## 2023-11-16 DIAGNOSIS — M3500 Sicca syndrome, unspecified: Principal | ICD-10-CM

## 2023-11-16 DIAGNOSIS — Z79899 Other long term (current) drug therapy: Secondary | ICD-10-CM

## 2023-11-16 NOTE — Telephone Encounter
 Lab orders faxed to [facility] [phone]

## 2023-11-17 LAB — CBC AND DIFF

## 2023-11-17 LAB — COMPREHENSIVE METABOLIC PANEL

## 2023-11-22 ENCOUNTER — Encounter: Admit: 2023-11-22 | Discharge: 2023-11-22 | Payer: MEDICARE | Primary: General Practice

## 2023-11-27 ENCOUNTER — Encounter: Admit: 2023-11-27 | Discharge: 2023-11-27 | Payer: MEDICARE | Primary: General Practice

## 2023-11-27 MED ORDER — LOSARTAN-HYDROCHLOROTHIAZIDE 50-12.5 MG PO TAB
1 | ORAL_TABLET | Freq: Every morning | ORAL | 1 refills | 28.00000 days | Status: AC
Start: 2023-11-27 — End: ?

## 2023-11-27 NOTE — Telephone Encounter
 Called and discussed Dr. Quin' recommendations.  Pt is agreeable to plan. She will continue to monitor her BP and follow up with any further needs.

## 2023-11-27 NOTE — Telephone Encounter
 Crystalmarie called into the nursing line to report that she has been having issues her blood pressure.  She states that she had the first issue on Tuesday.  She wasn't feeling right and felt a tingling in her head.  She checked her BP and it was 168/100.  She laid down and took it easy and it improved.  She felt better until Saturday.  She could tell her BP was up, but she was not around her BP cuff to check it.  On Sunday she also felt bad and her blood pressure was 180/101.  She said it did eventually come back down to around 140/65.  Today her blood pressure is elevated again 173/97, and 172/98 after taking her metoprolol .  Her heart rate is around 61.  Aylee states that she just doesn't feel right.  She states that she has tingling in her head.  She denies any chest pain or vision changes.  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    Confirmed medications with Calynn.  She is taking Metoprolol  XL 50mg  daily and Flecainide  50mg  twice daily.      Will route to Dr. Quin for review and recommendations.

## 2023-11-28 ENCOUNTER — Encounter: Admit: 2023-11-28 | Discharge: 2023-11-28 | Payer: MEDICARE | Primary: General Practice

## 2023-11-29 ENCOUNTER — Encounter: Admit: 2023-11-29 | Discharge: 2023-11-29 | Payer: MEDICARE | Primary: General Practice

## 2023-11-29 DIAGNOSIS — Z7969 Long term (current) use of other immunomodulators and immunosuppressants: Secondary | ICD-10-CM

## 2023-11-29 DIAGNOSIS — Z79899 Other long term (current) drug therapy: Secondary | ICD-10-CM

## 2023-11-29 DIAGNOSIS — M3500 Sicca syndrome, unspecified: Principal | ICD-10-CM

## 2023-11-29 MED FILL — RXAMB NALTREXONE 1 MG/ML ORAL SUSPENSION (BATCHED COMPOUND): ORAL | 30 days supply | Qty: 0.09 | Fill #1 | Status: AC

## 2023-12-06 ENCOUNTER — Encounter: Admit: 2023-12-06 | Discharge: 2023-12-06 | Payer: MEDICARE | Primary: General Practice

## 2023-12-11 ENCOUNTER — Encounter: Admit: 2023-12-11 | Discharge: 2023-12-11 | Payer: MEDICARE | Primary: General Practice

## 2023-12-11 MED ORDER — ATORVASTATIN 40 MG PO TAB
40 mg | ORAL_TABLET | Freq: Every day | ORAL | 3 refills | 90.00000 days | Status: AC
Start: 2023-12-11 — End: ?

## 2023-12-21 ENCOUNTER — Encounter: Admit: 2023-12-21 | Discharge: 2023-12-21 | Payer: MEDICARE | Primary: General Practice

## 2023-12-30 ENCOUNTER — Encounter: Admit: 2023-12-30 | Discharge: 2023-12-30 | Payer: MEDICARE | Primary: General Practice

## 2024-01-01 ENCOUNTER — Encounter: Admit: 2024-01-01 | Discharge: 2024-01-01 | Payer: MEDICARE | Primary: General Practice

## 2024-01-18 ENCOUNTER — Encounter: Admit: 2024-01-18 | Discharge: 2024-01-18 | Payer: MEDICARE | Primary: General Practice

## 2024-01-23 ENCOUNTER — Ambulatory Visit: Admit: 2024-01-23 | Discharge: 2024-01-24 | Payer: MEDICARE | Primary: General Practice

## 2024-01-23 ENCOUNTER — Encounter: Admit: 2024-01-23 | Discharge: 2024-01-23 | Payer: MEDICARE | Primary: General Practice

## 2024-01-24 ENCOUNTER — Encounter: Admit: 2024-01-24 | Discharge: 2024-01-24 | Payer: MEDICARE | Primary: General Practice

## 2024-01-25 ENCOUNTER — Encounter: Admit: 2024-01-25 | Discharge: 2024-01-25 | Payer: MEDICARE | Primary: General Practice

## 2024-01-25 MED FILL — RXAMB NALTREXONE 4.5 MG CAPSULE (COMPOUND): ORAL | 90 days supply | Qty: 0.41 | Fill #1 | Status: AC

## 2024-01-30 ENCOUNTER — Encounter: Admit: 2024-01-30 | Discharge: 2024-01-30 | Payer: MEDICARE | Primary: General Practice

## 2024-02-02 ENCOUNTER — Encounter: Admit: 2024-02-02 | Discharge: 2024-02-02 | Payer: MEDICARE | Primary: General Practice

## 2024-02-08 ENCOUNTER — Encounter: Admit: 2024-02-08 | Discharge: 2024-02-08 | Payer: MEDICARE | Primary: General Practice

## 2024-02-15 ENCOUNTER — Encounter: Admit: 2024-02-15 | Discharge: 2024-02-15 | Payer: MEDICARE | Primary: General Practice

## 2024-02-16 ENCOUNTER — Encounter: Admit: 2024-02-16 | Discharge: 2024-02-16 | Payer: MEDICARE | Primary: General Practice

## 2024-02-17 ENCOUNTER — Encounter: Admit: 2024-02-17 | Discharge: 2024-02-17 | Payer: MEDICARE | Primary: General Practice

## 2024-02-22 ENCOUNTER — Encounter: Admit: 2024-02-22 | Discharge: 2024-02-22 | Payer: MEDICARE | Primary: General Practice

## 2024-02-22 ENCOUNTER — Ambulatory Visit: Admit: 2024-02-22 | Discharge: 2024-02-23 | Payer: MEDICARE | Primary: General Practice

## 2024-02-27 ENCOUNTER — Encounter: Admit: 2024-02-27 | Discharge: 2024-02-27 | Payer: MEDICARE | Primary: General Practice

## 2024-02-27 NOTE — Telephone Encounter [36]
 Patient states that she has started a new heart medication.  She has uncontrolled urinary incontinence and is not sure if that medication has worsen her symptoms.  She is requesting an appointment to discuss treatment options.  LVM that AR placed and patient can call [phone] option 1 to schedule or scheduling will be contacting her.

## 2024-02-29 ENCOUNTER — Encounter: Admit: 2024-02-29 | Discharge: 2024-02-29 | Payer: MEDICARE | Primary: General Practice

## 2024-02-29 ENCOUNTER — Ambulatory Visit: Admit: 2024-02-29 | Discharge: 2024-03-01 | Payer: MEDICARE | Primary: General Practice

## 2024-03-04 ENCOUNTER — Encounter: Admit: 2024-03-04 | Discharge: 2024-03-04 | Payer: MEDICARE | Primary: General Practice

## 2024-03-04 NOTE — Assessment & Plan Note [38]
 09/2022 - Cardiac cath showed mild CAD

## 2024-03-04 NOTE — Assessment & Plan Note [38]
 Metoprolol XL 50/d and flecainide 50 BID.

## 2024-03-04 NOTE — Assessment & Plan Note [38]
 Most recent echo was 09/2022 showing EF 50-55%.

## 2024-03-05 ENCOUNTER — Encounter: Admit: 2024-03-05 | Discharge: 2024-03-05 | Payer: MEDICARE | Primary: General Practice

## 2024-03-05 VITALS — BP 119/74 | HR 59 | Ht 62.5 in | Wt 152.6 lb

## 2024-03-05 DIAGNOSIS — I10 Essential (primary) hypertension: Secondary | ICD-10-CM

## 2024-03-05 DIAGNOSIS — I447 Left bundle-branch block, unspecified: Principal | ICD-10-CM

## 2024-03-05 DIAGNOSIS — I2510 Atherosclerotic heart disease of native coronary artery without angina pectoris: Secondary | ICD-10-CM

## 2024-03-05 DIAGNOSIS — I471 PSVT (paroxysmal supraventricular tachycardia): Secondary | ICD-10-CM

## 2024-03-05 DIAGNOSIS — R601 Generalized edema: Secondary | ICD-10-CM

## 2024-03-05 MED ORDER — FUROSEMIDE 40 MG PO TAB
40 mg | ORAL_TABLET | Freq: Every day | ORAL | 3 refills | 90.00000 days | Status: AC | PRN
Start: 2024-03-05 — End: ?

## 2024-03-05 MED ORDER — METOPROLOL SUCCINATE 50 MG PO TB24
50 mg | Freq: Two times a day (BID) | ORAL | 0 refills | 90.00000 days | Status: AC
Start: 2024-03-05 — End: ?

## 2024-03-05 NOTE — Patient Instructions [37]
 Increase metoprolol  XL to 50 mg twice daily  Echocardiogram  Furosemide  40 mg daily as needed for swelling

## 2024-03-05 NOTE — Assessment & Plan Note [38]
 She's had variable BP readings.  I increased her metoprolol  XL to 50 mg BID.  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

## 2024-03-05 NOTE — Assessment & Plan Note [38]
 I ordered an echocardiogram to rule out LV dysfunction as a contributing etiology for her generalized edema.  I also gave her a prescription for furosemide  40 mg as needed to see if this helps with the swelling.  I don't have a good sense for the cause of this problem for her.    If she ends up taking much furosemide  we'll need to check a follow-up BMP within the next few weeks.

## 2024-03-05 NOTE — Progress Notes [1]
 Date of Service: 03/05/2024    Kristen Norris is a 74 y.o. female.       HPI     Kristen Norris was in the [facility] today for follow-up regarding left bundle branch block.,  PSVT, and episodes of chest discomfort.  When I last saw her in June, 2024, she was going through a terribly stressful time with her granddaughter dying recently of ovarian carcinoma.  The granddaughter's 31-year-old son is living with Krist[facility] Norris and she is currently his guardian.     She'd had an odd episode of chest discomfort back in June, 2024.  A pharmacologic stress test was abnormal and she had coronary arteriography showed only mild disease.  In retrospect the chest discomfort and nausea were likely to be symptoms of her gall bladder disease.  Her gall bladder came out and she's had no more trouble.     She has not had trouble with palpitation symptoms.  She denies any syncope or near syncope and she has had no TIA or stroke symptoms.    Kristen Norris usually poses a diagnostic challenge during her visits.  Today the focus was mostly on variable BP readings and generalized edema.       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  2-week cardiac monitor shows episodes of fast SVT 3-20 seconds in duration.      Overactive bladder 10/31/2018    Xerostomia 02/23/2018    Rheumatoid factor positive 02/23/2018    Arthralgia of right temporomandibular joint 02/23/2018    S/P hysterectomy with oophorectomy 07/15/2011    Pelvic pain in female 07/15/2011    Cystocele 07/15/2011    Left bundle branch block 03/17/2011    Preoperative evaluation to rule out surgical contraindication 03/17/2011    Abnormal electro-oculogram 03/17/2011    Ovarian cyst, left 01/05/2011    Degeneration of lumbar or lumbosacral intervertebral disc 04/18/2010    Thoracic or lumbosacral neuritis or radiculitis, unspecified 04/18/2010    S/P Lumpectomy of Breast-Surgical Oncology Notes 12/17/2007     DIAGNOSIS:  Left breast Invasive Ductal Cancer diagnosed 3/07,  STAGE II-A  T2 N0 M0  Markers:  ER 100%, PR 0%, Ki67 15%, HER-2/neu negative.     PROCEDURE: Left lumpectomy with sentinel node biopsy 06/07/2005    HPI: This 74 year old postmenopausal female developed pain in the medial side of the left chest wall in 2/07.  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 She underwent left lumpectomy and sentinel lymph node sampling on 06/07/05. Lumpectomy revealed a 2.1 x 2-cm invasive ductal carcinoma, grade II. No angiolymphatic invasion. There was perineural invasion. 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 Margins were clear. 0/2 sentinel lymph nodes were involved.   Oncotype DX was sent and came back with a high recurrence score of 33, which corresponds to a 10-year rate of distant recurrence of 23% with adjuvant tamoxifen alone. For adjuvant chemotherapy, she received four cycles of dose-dense AC and 10 weeks of taxol by Dr. Arlin Pottier.  . Taxol had to be stopped at 10 weeks due to grade 3 peripheral neuropathy. She was placed on adjuvant Arimidex in 10/07.  Radiation therapy was given by Dr. Consuelo Sprague in [place], [place] and completed in December 2007.   She was last seen in 12/2007 with c/o left axillary burning.  On exam there was a stable scar retraction at left lumpectomy site in left UIQ. No palpable mass, no nipple or areolar change. Right breast: palpable fullness with tenderness right UIQ most consistent with prominent pectoral muscle (patient works heavy labor as gardener). Left diagnostic mammogram 11/09 was class 2. Right breast with palpable tender fullness right upper breast/chest wall, benign clinically. Right breast sonogram also negative 11/09.  Follow-up exam 03/2008 assessment:  Right breast pain- negative clinical exam as well as negative Ultrasound. Findings and complaint most consistent with musculoskeletal etiology (patient works in aeronautical engineer).  She now returns for a 3 month f/u exam to confirm this impression.           GERD (gastroesophageal reflux disease)     Depression     Environmental allergies     PN (Peripheral Neuropathy)     Hypoglycemia     Osteoporosis      BMD  T-score (-2.3) spine and (-2.9) femur on Actonel      Vitamin D deficiency     Back pain 07/18/2007     The patient is currently experiencing pain.      Location: middle back   Intensity (1-10): 6   Description: dull   Duration: constant  Fatigue (0-10): 7      Pain in joint, ankle and foot 10/05/2006    Malignant neoplasm of other specified sites of female breast 06/15/2006     DIAGNOSIS:  Left breast cancer, 3/07.     STAGE:  II-A;  T2N0M0, ER positive, HER-2/neu negative.     PAST THERAPY:  This 74 year old postmenopausal female developed pain in the medial side of the left chest wall in 2/07.  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 She underwent left lumpectomy and sentinel lymph node sampling on 06/07/05. Lumpectomy revealed a 2.1 x 2-cm invasive ductal carcinoma, grade II. No angiolymphatic invasion. There was perineural invasion. 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 Margins were clear. 0/2 sentinel lymph nodes were involved. Oncotype DX was sent and came back with a high recurrence score of 33, which corresponds to a 10-year rate of distant recurrence  of 23% with adjuvant tamoxifen alone. For adjuvant chemotherapy, she received four cycles of dose-dense AC and 10 weeks of taxol. Taxol had to be stopped at 10 weeks due to grade 3 peripheral neuropathy. She was placed on adjuvant Arimidex in 10/07.  Radiation therapy was given in [place], [place] and completed in December 2007.    PRESENT THERAPY:  Adjuvant Arimidex since 11/24/05.          Edema 02/28/2006       Review of Systems   Constitutional: Positive for malaise/fatigue.   HENT: Negative.     Eyes: Negative.    Cardiovascular:  Positive for dyspnea on exertion, irregular heartbeat, leg swelling and palpitations.   Respiratory: Negative.     Endocrine: Negative.    Hematologic/Lymphatic: Bruises/bleeds easily.   Skin: Negative.    Musculoskeletal:  Positive for joint pain and joint swelling.   Gastrointestinal: Negative.    Genitourinary: Negative.    Neurological: Negative.    Psychiatric/Behavioral: Negative.     Allergic/Immunologic: Negative.        Physical Exam    Physical Exam   General Appearance: no distress   Skin: warm, no ulcers or xanthomas   Digits and Nails: no cyanosis or clubbing   Eyes: conjunctivae and lids normal, pupils are equal and round   Teeth/Gums/Palate: dentition unremarkable, no lesions   Lips & Oral Mucosa: no pallor or cyanosis   Neck Veins: normal JVP , neck veins are not distended   Thyroid: no nodules, masses, tenderness or enlargement   Chest Inspection: chest is normal in appearance   Respiratory Effort: breathing comfortably, no respiratory distress   Auscultation/Percussion: lungs clear to auscultation, no rales or rhonchi, no wheezing   PMI: PMI not enlarged or displaced   Cardiac Rhythm: regular rhythm and normal rate   Cardiac Auscultation: S1, S2 normal, no rub, no gallop   Murmurs: no murmur   Peripheral Circulation: normal peripheral circulation   Carotid Arteries: normal carotid upstroke bilaterally, no bruits   Radial Arteries: normal symmetric radial pulses   Abdominal Aorta: no abdominal aortic bruit   Pedal Pulses: normal symmetric pedal pulses   Lower Extremity Edema: 1+ bilateral lower extremity edema   Abdominal Exam: soft, non-tender, no masses, bowel sounds normal   Liver & Spleen: no organomegaly   Gait & Station: walks without assistance   Muscle Strength: normal muscle tone   Orientation: oriented to time, place and person   Affect & Mood: appropriate and sustained affect   Language and Memory: patient responsive and seems to comprehend information   Neurologic Exam: neurological assessment grossly intact   Other: moves all extremities      Cardiovascular Health Factors  Vitals BP Readings from Last 3 Encounters:   03/05/24 119/74   02/29/24 128/56   02/22/24 (!) (P) 142/78     Wt Readings from Last 3 Encounters:   03/05/24 69.2 kg (152 lb 9.6 oz)   02/29/24 65.8 kg (145 lb)   02/22/24 67 kg (147 lb 12.8 oz)     BMI Readings from Last 3 Encounters:   03/05/24 27.47 kg/m?   02/29/24 26.52 kg/m?   02/22/24 26.60 kg/m?      Smoking Tobacco Use History[1]   Lipid Profile Cholesterol   Date Value Ref Range Status   03/08/2022 188  Final     HDL   Date Value Ref Range Status   03/08/2022 49  Final     LDL   Date Value Ref  Range Status   03/08/2022 115 (H) <100 Final     Triglycerides   Date Value Ref Range Status   03/08/2022 124 Final      Blood Sugar Hemoglobin A1C   Date Value Ref Range Status   09/27/2022 5.8  Final     Glucose   Date Value Ref Range Status   08/14/2023 94 70 - 100 mg/dL Final   87/93/7975 893 (H) 70 - 105 Final   10/07/2022 94 70 - 100 MG/DL Final     Glucose, POC   Date Value Ref Range Status   03/24/2011 117 (H) 70 - 100 MG/DL Final         Problems Addressed Today  Encounter Diagnoses   Name Primary?    Left bundle branch block Yes    Atherosclerosis of native coronary artery of native heart without angina pectoris     PSVT (paroxysmal supraventricular tachycardia)     Generalized edema     Primary hypertension        Assessment and Plan       Left bundle branch block  Most recent echo was 09/2022 showing EF 50-55%.     Atherosclerosis of native coronary artery of native heart without angina pectoris  09/2022 - Cardiac cath showed mild CAD      PSVT (paroxysmal supraventricular tachycardia)  Metoprolol  XL 50/d and flecainide  50 BID.     Edema  I ordered an echocardiogram to rule out LV dysfunction as a contributing etiology for her generalized edema.  I also gave her a prescription for furosemide  40 mg as needed to see if this helps with the swelling.  I don't have a good sense for the cause of this problem for her.    If she ends up taking much furosemide  we'll need to check a follow-up BMP within the next few weeks.    Primary hypertension  She's had variable BP readings.  I increased her metoprolol  XL to 50 mg BID.  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    Current Medications (including today's revisions)   aspirin  (ASPIRIN  CHILDRENS) 81 mg chewable tablet Chew one tablet by mouth daily.    atorvastatin  (LIPITOR) 40 mg tablet TAKE ONE TABLET BY MOUTH DAILY. INDICATIONS: HARDENING OF THE ARTERIES DUE TO PLAQUE BUILDUP    CALCIUM CARBONATE (CALCIUM 500 PO) Take 1 Cap by mouth Daily.    Cholecalciferol (VITAMIN D3) 125 mcg (5,000 unit) capsule Take two capsules by mouth daily.    cyclobenzaprine  (FLEXERIL ) 5 mg tablet Take one tablet by mouth three times daily as needed for Muscle Cramps. Indications: muscle spasm    diclofenac sodium (VOLTAREN) 1 % topical gel Apply four g topically to affected area four times daily. Indications: joint damage causing pain and loss of function    duloxetine  DR (CYMBALTA ) 60 mg capsule TAKE ONE CAPSULE BY MOUTH DAILY.    flecainide  (TAMBOCOR ) 50 mg tablet TAKE ONE TABLET BY MOUTH TWICE DAILY.    furosemide  (LASIX ) 40 mg tablet Take one tablet by mouth daily as needed.    hydroxychloroquine  (PLAQUENIL ) 200 mg tablet ALTERNATE BETWEEN ONE AND ONE AND A HALF TABLETS EVERY OTHER DAY FOR AN AVERAGE DAILY DOSE OF 250 MG.    leflunomide  (ARAVA ) 10 mg tablet Take one tablet by mouth daily.    losartan -hydroCHLOROthiazide  (HYZAAR ) 50-12.5 mg tablet Take one  tablet by mouth every morning.    metoprolol  succinate XL (TOPROL  XL) 50 mg extended release tablet Take one tablet by mouth twice daily.    metroNIDAZOLE  (METROGEL  VAGINAL) 0.75 % (37.5mg /5 gram) vaginal gel Insert or Apply five g to vaginal area at bedtime daily.    naltrexone  4.5 MG oral capsule (COMPOUND) Take one capsule by mouth daily.    nitroglycerin  (NITROSTAT ) 0.4 mg tablet Place 1 tab under tongue every 5 minutes as needed for Chest Pain (Not to exceed 3 doses /15 min. If pain persists , seek medical attention    pregabalin  (LYRICA ) 50 mg capsule TAKE 1 CAPSULE BY MOUTH THREE TIMES DAILY.    traMADoL  (ULTRAM ) 50 mg tablet Take one tablet by mouth every 4 hours as needed for Pain. Indications: neuropathic pain, pain    traZODone  (DESYREL ) 50 mg tablet Take one tablet by mouth at bedtime as needed for Sleep.    vibegron (GEMTESA) 75 mg tablet Take one tablet by mouth daily. (Patient not taking: Reported on 03/05/2024)    VITAMIN K2 PO Take 100 mg by mouth daily.     Total time spent on today's office visit was 45 minutes.  This includes face-to-face in person visit with patient as well as nonface-to-face time including review of the EMR, outside records, labs, radiologic studies, echocardiogram & other cardiovascular studies, formation of treatment plan, after visit summary, future disposition, and lastly on documentation.              [1]   Social History  Tobacco Use   Smoking Status Former    Current packs/day: 0.00    Average packs/day: 1 pack/day for 14.0 years (14.0 ttl pk-yrs)    Types: Cigarettes    Start date: 04/08/1986    Quit date: 04/07/2000    Years since quitting: 23.9   Smokeless Tobacco Never

## 2024-03-06 ENCOUNTER — Encounter: Admit: 2024-03-06 | Discharge: 2024-03-06 | Payer: MEDICARE | Primary: General Practice

## 2024-03-11 ENCOUNTER — Encounter: Admit: 2024-03-11 | Discharge: 2024-03-11 | Payer: MEDICARE | Primary: General Practice

## 2024-03-11 MED ORDER — TROSPIUM 60 MG PO CP24
60 mg | ORAL_CAPSULE | Freq: Every day | ORAL | 3 refills | 30.00000 days | Status: AC
Start: 2024-03-11 — End: ?

## 2024-03-11 NOTE — Telephone Encounter [36]
 Patient reported Gemtesa $450.  Insurance recommends Tolterodine and Oxybutynin  and Oxybutynin  XL.  Dr. Ronold doesn't recommend these options when age > 94.  Rx for Trospium  sent to patient's pharmacy.  She will call to check price of this medication before driving to pharmacy and will attempt to use GoodRx or pharmacy program.

## 2024-03-11 NOTE — Telephone Encounter [36]
 Results and recommendations called to patient. Patient states that she took 2 doses of lasix  and noticed a dramatic improvement in her swelling. Encouraged patient to reach out if she is needing to take more doses. She states that she is already scheduled for lab work in a couple of days with her PCP.

## 2024-03-11 NOTE — Telephone Encounter [36]
-----   Message from GORMAN Balloon, MD sent at 03/11/2024  3:01 PM CST -----  [facility], can you please let Crysta know that the echo looks OK.  Did the diuretic help at all with her swelling?    Cc:  Almarie Pfeiffer, APRN  ----- Message -----  From: Lennice Sharleen LABOR, MD  Sent: 03/07/2024   2:55 PM CST  To: Elspeth JONETTA Balloon, MD

## 2024-03-13 ENCOUNTER — Encounter: Admit: 2024-03-13 | Discharge: 2024-03-13 | Payer: MEDICARE | Primary: General Practice
